# Patient Record
Sex: Female | Born: 1985 | Race: White | Hispanic: No | Marital: Married | Smoking: Never smoker
Health system: Southern US, Community
[De-identification: ages and names within clinical notes are randomized; demographics above are authoritative.]

## PROBLEM LIST (undated history)

## (undated) DIAGNOSIS — G5600 Carpal tunnel syndrome, unspecified upper limb: Secondary | ICD-10-CM

## (undated) DIAGNOSIS — E669 Obesity, unspecified: Secondary | ICD-10-CM

## (undated) DIAGNOSIS — R42 Dizziness and giddiness: Secondary | ICD-10-CM

## (undated) DIAGNOSIS — J45909 Unspecified asthma, uncomplicated: Secondary | ICD-10-CM

## (undated) DIAGNOSIS — D509 Iron deficiency anemia, unspecified: Secondary | ICD-10-CM

## (undated) DIAGNOSIS — S92919A Unspecified fracture of unspecified toe(s), initial encounter for closed fracture: Secondary | ICD-10-CM

## (undated) DIAGNOSIS — R55 Syncope and collapse: Secondary | ICD-10-CM

## (undated) DIAGNOSIS — E559 Vitamin D deficiency, unspecified: Secondary | ICD-10-CM

## (undated) DIAGNOSIS — G43909 Migraine, unspecified, not intractable, without status migrainosus: Secondary | ICD-10-CM

## (undated) DIAGNOSIS — F429 Obsessive-compulsive disorder, unspecified: Secondary | ICD-10-CM

## (undated) HISTORY — DX: Migraine, unspecified, not intractable, without status migrainosus: G43.909

## (undated) HISTORY — DX: Obsessive-compulsive disorder, unspecified: F42.9

## (undated) HISTORY — PX: TONSILLECTOMY: SUR1361

## (undated) HISTORY — DX: Unspecified fracture of unspecified toe(s), initial encounter for closed fracture: S92.919A

## (undated) HISTORY — DX: Iron deficiency anemia, unspecified: D50.9

## (undated) HISTORY — PX: WISDOM TOOTH EXTRACTION: SHX21

## (undated) HISTORY — DX: Carpal tunnel syndrome, unspecified upper limb: G56.00

## (undated) HISTORY — DX: Syncope and collapse: R55

## (undated) HISTORY — DX: Vitamin D deficiency, unspecified: E55.9

## (undated) HISTORY — DX: Dizziness and giddiness: R42

## (undated) HISTORY — DX: Obesity, unspecified: E66.9

---
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
## 2010-07-06 ENCOUNTER — Ambulatory Visit (HOSPITAL_COMMUNITY)
Admission: RE | Admit: 2010-07-06 | Discharge: 2010-07-06 | Payer: Self-pay | Source: Home / Self Care | Attending: Family Medicine | Admitting: Family Medicine

## 2010-12-02 ENCOUNTER — Emergency Department (HOSPITAL_COMMUNITY): Payer: BC Managed Care – PPO

## 2010-12-02 ENCOUNTER — Emergency Department (HOSPITAL_COMMUNITY)
Admission: EM | Admit: 2010-12-02 | Discharge: 2010-12-02 | Disposition: A | Discharge status: Home / Self Care | Payer: BC Managed Care – PPO | Attending: Emergency Medicine | Admitting: Emergency Medicine

## 2010-12-02 DIAGNOSIS — S92919A Unspecified fracture of unspecified toe(s), initial encounter for closed fracture: Secondary | ICD-10-CM | POA: Insufficient documentation

## 2010-12-02 DIAGNOSIS — IMO0002 Reserved for concepts with insufficient information to code with codable children: Secondary | ICD-10-CM | POA: Insufficient documentation

## 2010-12-02 DIAGNOSIS — Y92009 Unspecified place in unspecified non-institutional (private) residence as the place of occurrence of the external cause: Secondary | ICD-10-CM | POA: Insufficient documentation

## 2011-08-30 ENCOUNTER — Other Ambulatory Visit (HOSPITAL_COMMUNITY): Payer: Self-pay | Admitting: Family Medicine

## 2011-08-30 ENCOUNTER — Ambulatory Visit (HOSPITAL_COMMUNITY)
Admission: RE | Admit: 2011-08-30 | Discharge: 2011-08-30 | Disposition: A | Discharge status: Home / Self Care | Payer: BC Managed Care – PPO | Source: Ambulatory Visit | Attending: Family Medicine | Admitting: Family Medicine

## 2011-08-30 DIAGNOSIS — G8929 Other chronic pain: Secondary | ICD-10-CM

## 2011-08-30 DIAGNOSIS — M25549 Pain in joints of unspecified hand: Secondary | ICD-10-CM | POA: Insufficient documentation

## 2011-08-30 DIAGNOSIS — M25539 Pain in unspecified wrist: Secondary | ICD-10-CM | POA: Insufficient documentation

## 2012-04-30 ENCOUNTER — Encounter (HOSPITAL_COMMUNITY): Payer: Self-pay | Admitting: *Deleted

## 2012-04-30 ENCOUNTER — Emergency Department (HOSPITAL_COMMUNITY)
Admission: EM | Admit: 2012-04-30 | Discharge: 2012-04-30 | Disposition: A | Discharge status: Home / Self Care | Payer: BC Managed Care – PPO | Attending: Emergency Medicine | Admitting: Emergency Medicine

## 2012-04-30 DIAGNOSIS — R531 Weakness: Secondary | ICD-10-CM

## 2012-04-30 DIAGNOSIS — R5383 Other fatigue: Secondary | ICD-10-CM | POA: Insufficient documentation

## 2012-04-30 DIAGNOSIS — R5381 Other malaise: Secondary | ICD-10-CM | POA: Insufficient documentation

## 2012-04-30 DIAGNOSIS — R42 Dizziness and giddiness: Secondary | ICD-10-CM | POA: Insufficient documentation

## 2012-04-30 DIAGNOSIS — R0602 Shortness of breath: Secondary | ICD-10-CM | POA: Insufficient documentation

## 2012-04-30 HISTORY — DX: Unspecified asthma, uncomplicated: J45.909

## 2012-04-30 LAB — BASIC METABOLIC PANEL
BUN: 13 mg/dL (ref 6–23)
CO2: 24 mEq/L (ref 19–32)
Calcium: 9.6 mg/dL (ref 8.4–10.5)
Chloride: 101 mEq/L (ref 96–112)
Creatinine, Ser: 0.62 mg/dL (ref 0.50–1.10)
GFR calc Af Amer: >90 mL/min (ref >90)
GFR calc non Af Amer: >90 mL/min (ref >90)
Glucose, Bld: 89 mg/dL (ref 70–99)
Potassium: 4 mEq/L (ref 3.5–5.1)
Sodium: 136 mEq/L (ref 135–145)

## 2012-04-30 LAB — CBC
HCT: 40.3 % (ref 36.0–46.0)
Hemoglobin: 13.4 g/dL (ref 12.0–15.0)
MCH: 28.5 pg (ref 26.0–34.0)
MCHC: 33.3 g/dL (ref 30.0–36.0)
MCV: 85.7 fL (ref 78.0–100.0)
Platelets: 438 10*3/uL — ABNORMAL HIGH (ref 150–400)
RBC: 4.7 MIL/uL (ref 3.87–5.11)
RDW: 13.9 % (ref 11.5–15.5)
WBC: 11.9 10*3/uL — ABNORMAL HIGH (ref 4.0–10.5)

## 2012-04-30 LAB — MAGNESIUM: Magnesium: 2 mg/dL (ref 1.5–2.5)

## 2012-04-30 NOTE — ED Provider Notes (Signed)
History  This chart was scribed for Raeford Razor, MD by Bennett Scrape. This patient was seen in room APA02/APA02 and the patient's care was started at 4:51PM.  CSN: [id number]  Arrival date & time 04/30/12  1534   First MD Initiated Contact with Patient 04/30/12 1651      Chief Complaint  Patient presents with  . Shortness of Breath    The history is provided by the patient. No language interpreter was used.    Shannon Brooks is a 26 y.o. female who presents to the Emergency Department complaining of 4 episodes of intermittent chest tightness, SOB, HA and legs weakness for the past 4 days. She reports that during these 5 minute epsidoes she starts to stumble, losses coordination, has generalized weakness, feels lightheaded and her HA worsens which leads to her legs ";giving out from under me". She reports 4 falls since onset, 2 which happened at work today. She has a h/o asthma and reports that she has tried her inhaler for the chest tightness with no improvement. She denies LOC stating that she is completely alert and awake during the episodes and reports that she has been able to ambulate after the episodes. She has seen her PCP for the symptoms recently and states that she has an [place] for her gallbladder on 05/08/12 even though she denies c/o GI symptoms. She denies having prior episodes of similar symptoms. She also reports a mild dry cough but denies fever, abdominal pain, abnormal vaginal bleeding or hematochezia as associated symptoms. She denies possibility of pregnancy. She also has a h/o depression and denies smoking and alcohol use.  PCP is [facility].  Past Medical History  Diagnosis Date  . Asthma   . Depression     Past Surgical History  Procedure Date  . Tonsillectomy     History reviewed. No pertinent family history.  History  Substance Use Topics  . Smoking status: Never Smoker   . Smokeless tobacco: Not on file  . Alcohol Use: No    No OB history  provided.  Review of Systems  Constitutional: Negative for fever and chills.  Respiratory: Positive for cough, chest tightness and shortness of breath.   Cardiovascular: Negative for chest pain and leg swelling.  Gastrointestinal: Negative for nausea, vomiting, abdominal pain, diarrhea and blood in stool.  Genitourinary: Negative for vaginal bleeding.  Neurological: Positive for weakness and headaches. Negative for syncope and numbness.  All other systems reviewed and are negative.    Allergies  Penicillins  Home Medications  No current outpatient prescriptions on file.  Triage Vitals: BP 120/70  Pulse 64  Temp 99.1 F (37.3 C) (Oral)  Resp 20  Ht 5\' 5"  (1.651 m)  Wt 250 lb (113.399 kg)  BMI 41.60 kg/m2  SpO2 100%  LMP 04/30/2012  Physical Exam  Nursing note and vitals reviewed. Constitutional: She is oriented to person, place, and time. She appears well-developed and well-nourished. No distress.       Pt is resting comfortably   HENT:  Head: Normocephalic and atraumatic.  Eyes: Conjunctivae normal and EOM are normal. Pupils are equal, round, and reactive to light.  Neck: Normal range of motion. Neck supple.  Cardiovascular: Normal rate, regular rhythm and normal heart sounds.   Pulmonary/Chest: Effort normal and breath sounds normal. No respiratory distress. She has no wheezes.  Abdominal: Soft. Bowel sounds are normal.  Musculoskeletal: Normal range of motion.       Normal strength in all extremities  Neurological: She is alert and oriented to person, place, and time. No cranial nerve deficit.       Normal finger to nose, normal heel to shin test  Skin: Skin is warm and dry.  Psychiatric: She has a normal mood and affect.    ED Course  Procedures (including critical care time)  DIAGNOSTIC STUDIES: Oxygen Saturation is 100% on room air, normal by my interpretation.    COORDINATION OF CARE: 5:00PM-Informed pt that EKG appears normal. Discussed treatment plan  which includes a metabolic panel, CBC and magnesium with pt at bedside and pt agreed to plan.  6:46PM-Pt rechecked and is resting comfortably. Discussed discharge plan of following up with PCP to get a Holter monitor with pt at bedside and pt agreed to plan.   Labs Reviewed  CBC - Abnormal; Notable for the following:    WBC 11.9 (*)     Platelets 438 (*)     All other components within normal limits  BASIC METABOLIC PANEL  MAGNESIUM   No results found.  EKG:  Rhythm: normal sinus Vent. rate 64 BPM PR interval 146 ms QRS duration 96 ms QT/QTc 412/425 ms Axis: normal ST segments: NS ST changes anteriorly   1. Generalized weakness       MDM  26yF with near syncope. W/u fairly unremarkable. HD stable. EKG with normal intervals. Feel safe for DC at this time but discussed possibly holter monitoring.  I personally preformed the services scribed in my presence. The recorded information has been reviewed and considered. Raeford Razor, MD.        Raeford Razor, MD 05/10/12 [id number]

## 2012-04-30 NOTE — ED Notes (Signed)
Left in c/o mother for transport home; instructions reviewed and f/u information provided.  Verbalizes understanding.

## 2012-04-30 NOTE — ED Notes (Addendum)
Has fallen mult times since Friday, "my legs give way"  Seen by MD and blood work was normal.  Given ventolin inhaler. For sob.  Headache

## 2012-05-24 ENCOUNTER — Ambulatory Visit (HOSPITAL_COMMUNITY)
Admission: RE | Admit: 2012-05-24 | Discharge: 2012-05-24 | Disposition: A | Discharge status: Home / Self Care | Payer: BC Managed Care – PPO | Source: Ambulatory Visit | Attending: Internal Medicine | Admitting: Internal Medicine

## 2012-05-24 ENCOUNTER — Other Ambulatory Visit (HOSPITAL_COMMUNITY): Payer: Self-pay | Admitting: Internal Medicine

## 2012-05-24 DIAGNOSIS — R42 Dizziness and giddiness: Secondary | ICD-10-CM | POA: Insufficient documentation

## 2012-05-24 DIAGNOSIS — R55 Syncope and collapse: Secondary | ICD-10-CM

## 2012-06-05 ENCOUNTER — Ambulatory Visit (INDEPENDENT_AMBULATORY_CARE_PROVIDER_SITE_OTHER): Payer: BC Managed Care – PPO | Admitting: Cardiology

## 2012-06-05 ENCOUNTER — Encounter: Payer: Self-pay | Admitting: Cardiology

## 2012-06-05 VITALS — BP 115/78 | HR 86 | Ht 65.0 in | Wt 257.0 lb

## 2012-06-05 DIAGNOSIS — R55 Syncope and collapse: Secondary | ICD-10-CM

## 2012-06-05 NOTE — Progress Notes (Signed)
Clinical Summary Shannon Brooks is a 26 y.o.female referred for cardiology consultation by Dr. Margo Aye. She reports a one month history of daily episodes of near syncope, typically occurs when she is walking or standing up. She describes a sudden feeling of being lightheaded and as if she might faint, not preceded by any specific palpitations however. She then however will often sink to the ground, stating that her left leg gives way, although she reports quite clearly that she never loses consciousness. She feels weak thereafter but is able to continue on her activities. Also reports having headaches over the last month.  She states that she only fainted one time as a child due to an asthma attack. She states she has been on Paxil for at least a year.  ECG from October reviewed showing normal sinus rhythm with normal intervals. Recent head CT 11/1 was normal, ordered by Dr. Margo Aye.  Orthostatics checked in clinic today were normal.   Allergies  Allergen Reactions  . Penicillins   . Desipramine Rash    Current Outpatient Prescriptions  Medication Sig Dispense Refill  . albuterol (PROVENTIL HFA;VENTOLIN HFA) 108 (90 BASE) MCG/ACT inhaler Inhale 2 puffs into the lungs every 6 (six) hours as needed.      Marland Kitchen PARoxetine (PAXIL) 20 MG tablet Take 20 mg by mouth daily.        Past Medical History  Diagnosis Date  . Asthma   . Depression   . Broken toe   . Carpal tunnel syndrome     Past Surgical History  Procedure Date  . Tonsillectomy     Family History  Problem Relation Age of Onset  . Cancer Mother   . Muscular dystrophy Father     Social History Ms. Shannon Brooks reports that she has never smoked. She has never used smokeless tobacco. Shannon Brooks reports that she does not drink alcohol.  Review of Systems No reproducible exertional chest pain. She does feel some tightness when she has asthma flares, mentions a time when she had the symptoms working outdoors with her father in a smoky  environment. Otherwise negative.  Physical Examination Filed Vitals:   06/05/12 1428  BP: 115/78  Pulse: 86   Filed Weights   06/05/12 1347  Weight: 257 lb (116.574 kg)   Patient in no acute distress. HEENT: Conjunctiva and lids normal, oropharynx clear with moist mucosa. Neck: Supple, no elevated JVP or carotid bruits, no thyromegaly. Lungs: Clear to auscultation, nonlabored breathing at rest. Cardiac: Regular rate and rhythm, no S3 or significant systolic murmur, no pericardial rub. Abdomen: Soft, nontender, bowel sounds present. Extremities: No pitting edema, increased adipose tissue, distal pulses 2+. Skin: Warm and dry. Musculoskeletal: No kyphosis. Neuropsychiatric: Alert and oriented x3, affect grossly appropriate.   Problem List and Plan   Near syncope Etiology not certain. Seems to be orthostatic in description, although formal assessment of orthostatic blood pressure and heart rate today was normal. Paxil can sometimes cause headaches and dizziness, however she has been on this medication for a year and the symptoms are only one-month-old. She reports no propensity to fainting, denies any specific palpitations or known precipitant otherwise. Vasovagal syncope would be a consideration, although it is unclear why this would all of a sudden become an issue, and be so frequent. She also states having a normal echocardiogram done per Dr. Otilio Saber office, report to be requested. Will proceed with a 48 hour Holter monitor since she is reporting daily symptoms to exclude any obvious dysrhythmia. We  will inform her of the results and forward these results to Dr. Margo Aye for further evaluation.    Jonelle Sidle, M.D., F.A.C.C.

## 2012-06-05 NOTE — Assessment & Plan Note (Addendum)
Etiology not certain. Seems to be orthostatic in description, although formal assessment of orthostatic blood pressure and heart rate today was normal. Paxil can sometimes cause headaches and dizziness, however she has been on this medication for a year and the symptoms are only one-month-old. She reports no propensity to fainting, denies any specific palpitations or known precipitant otherwise. Vasovagal syncope would be a consideration, although it is unclear why this would all of a sudden become an issue, and be so frequent. She also states having a normal echocardiogram done per Dr. Otilio Saber office, report to be requested. Will proceed with a 48 hour Holter monitor since she is reporting daily symptoms to exclude any obvious dysrhythmia. We will inform her of the results and forward these results to Dr. Margo Aye for further evaluation.

## 2012-06-05 NOTE — Patient Instructions (Addendum)
Your physician has recommended that you wear a 48 holter monitor. Holter monitors are medical devices that record the heart's electrical activity. Doctors most often use these monitors to diagnose arrhythmias. Arrhythmias are problems with the speed or rhythm of the heartbeat. The monitor is a small, portable device. You can wear one while you do your normal daily activities. This is usually used to diagnose what is causing palpitations/syncope (passing out). Your physician recommends that you continue on your current medications as directed. Please refer to the Current Medication list given to you today.  We will call you with your results.

## 2012-06-05 NOTE — Addendum Note (Signed)
Addended by: Eustace Moore on: 06/05/2012 03:08 PM   Modules accepted: Orders

## 2012-06-14 ENCOUNTER — Telehealth: Payer: Self-pay | Admitting: *Deleted

## 2012-06-14 DIAGNOSIS — R55 Syncope and collapse: Secondary | ICD-10-CM

## 2012-06-14 NOTE — Telephone Encounter (Signed)
Called to inform patient that results of monitor was unavailable per [employer] and that she would need to come back to office to have new monitor placed. Nurse apologized to patient for this happening. Patient very understanding and will call office back to let nurse know when she is able to come back for 48 hour monitor replacement.

## 2012-08-20 ENCOUNTER — Encounter: Payer: Self-pay | Admitting: *Deleted

## 2012-09-04 ENCOUNTER — Encounter: Payer: Self-pay | Admitting: Neurology

## 2012-10-21 ENCOUNTER — Ambulatory Visit: Payer: Self-pay | Admitting: Neurology

## 2013-10-15 ENCOUNTER — Emergency Department (HOSPITAL_COMMUNITY)
Admission: EM | Admit: 2013-10-15 | Discharge: 2013-10-15 | Disposition: A | Discharge status: Home / Self Care | Payer: BC Managed Care – PPO | Attending: Emergency Medicine | Admitting: Emergency Medicine

## 2013-10-15 ENCOUNTER — Emergency Department (HOSPITAL_COMMUNITY): Payer: BC Managed Care – PPO

## 2013-10-15 ENCOUNTER — Encounter (HOSPITAL_COMMUNITY): Payer: Self-pay | Admitting: Emergency Medicine

## 2013-10-15 DIAGNOSIS — Z79899 Other long term (current) drug therapy: Secondary | ICD-10-CM | POA: Insufficient documentation

## 2013-10-15 DIAGNOSIS — F329 Major depressive disorder, single episode, unspecified: Secondary | ICD-10-CM | POA: Insufficient documentation

## 2013-10-15 DIAGNOSIS — Z8669 Personal history of other diseases of the nervous system and sense organs: Secondary | ICD-10-CM | POA: Insufficient documentation

## 2013-10-15 DIAGNOSIS — J45901 Unspecified asthma with (acute) exacerbation: Secondary | ICD-10-CM | POA: Insufficient documentation

## 2013-10-15 DIAGNOSIS — F3289 Other specified depressive episodes: Secondary | ICD-10-CM | POA: Insufficient documentation

## 2013-10-15 DIAGNOSIS — E669 Obesity, unspecified: Secondary | ICD-10-CM | POA: Insufficient documentation

## 2013-10-15 DIAGNOSIS — Z880 Allergy status to penicillin: Secondary | ICD-10-CM | POA: Insufficient documentation

## 2013-10-15 MED ORDER — ALBUTEROL SULFATE (2.5 MG/3ML) 0.083% IN NEBU
2.5000 mg | INHALATION_SOLUTION | Freq: Once | RESPIRATORY_TRACT | Status: AC
  Administered 2013-10-15: 2.5 mg via RESPIRATORY_TRACT
  Filled 2013-10-15: qty 3

## 2013-10-15 MED ORDER — PREDNISONE 50 MG PO TABS
60.0000 mg | ORAL_TABLET | Freq: Once | ORAL | Status: AC
  Administered 2013-10-15: 60 mg via ORAL
  Filled 2013-10-15 (×2): qty 1

## 2013-10-15 MED ORDER — IPRATROPIUM BROMIDE 0.02 % IN SOLN
0.5000 mg | Freq: Once | RESPIRATORY_TRACT | Status: AC
  Administered 2013-10-15: 0.5 mg via RESPIRATORY_TRACT
  Filled 2013-10-15: qty 2.5

## 2013-10-15 MED ORDER — PREDNISONE 10 MG PO TABS: ORAL_TABLET | ORAL | Status: DC

## 2013-10-15 NOTE — ED Notes (Signed)
Pt reports was exposed to an allergen for too long and left inhaler at her house by mistake.  Pt says used her inhaler when it was available but says did help.  Has had 4 puffs of ventolin inhaler but still feels SOB.

## 2013-10-15 NOTE — ED Provider Notes (Signed)
CSN: [id number]     Arrival date & time 10/15/13  1436 History   First MD Initiated Contact with Patient 10/15/13 1731     Chief Complaint  Patient presents with  . Asthma     (Consider location/radiation/quality/duration/timing/severity/associated sxs/prior Treatment) Patient is a 28 y.o. female presenting with asthma. The history is provided by the patient.  Asthma This is a chronic problem. Episode onset: Pt had a flare from her asthma after exposure to an allergen. The problem has been gradually worsening. Associated symptoms include congestion. Pertinent negatives include no abdominal pain, arthralgias, chest pain, coughing, fever, joint swelling, neck pain or rash. Nothing aggravates the symptoms. Treatments tried: allergy medications and albuterol.    Past Medical History  Diagnosis Date  . Asthma   . Depression   . Broken toe   . Carpal tunnel syndrome   . Obesity, unspecified     Obesity  . Near syncope Episodic near-syncope   Past Surgical History  Procedure Laterality Date  . Tonsillectomy     Family History  Problem Relation Age of Onset  . Cancer Mother   . Muscular dystrophy Father    History  Substance Use Topics  . Smoking status: Never Smoker   . Smokeless tobacco: Never Used  . Alcohol Use: Yes     Comment: Drinks alcohol on occasion   OB History   Grav Para Term Preterm Abortions TAB SAB Ect Mult Living                 Review of Systems  Constitutional: Negative for fever and activity change.       All ROS Neg except as noted in HPI  HENT: Positive for congestion. Negative for nosebleeds.   Eyes: Negative for photophobia and discharge.  Respiratory: Positive for wheezing. Negative for cough and shortness of breath.   Cardiovascular: Negative for chest pain and palpitations.  Gastrointestinal: Negative for abdominal pain and blood in stool.  Genitourinary: Negative for dysuria, frequency and hematuria.  Musculoskeletal: Negative for  arthralgias, back pain, joint swelling and neck pain.  Skin: Negative.  Negative for rash.  Neurological: Negative for dizziness, seizures and speech difficulty.  Psychiatric/Behavioral: Negative for hallucinations and confusion.       Depression      Allergies  Penicillins and Desipramine  Home Medications   Current Outpatient Rx  Name  Route  Sig  Dispense  Refill  . albuterol (PROVENTIL HFA;VENTOLIN HFA) 108 (90 BASE) MCG/ACT inhaler   Inhalation   Inhale 2 puffs into the lungs every 6 (six) hours as needed for wheezing or shortness of breath.          . norgestimate-ethinyl estradiol (ORTHO-CYCLEN,SPRINTEC,PREVIFEM) 0.25-35 MG-MCG tablet   Oral   Take 1 tablet by mouth daily.         . predniSONE (DELTASONE) 10 MG tablet      5,4,3,2,1 - take with food   15 tablet   0    BP 129/72  Pulse 72  Temp(Src) 98.3 F (36.8 C) (Oral)  Resp 22  Ht 5\' 5"  (1.651 m)  Wt 250 lb (113.399 kg)  BMI 41.60 kg/m2  SpO2 100%  LMP 10/03/2013 Physical Exam  Nursing note and vitals reviewed. Constitutional: She is oriented to person, place, and time. She appears well-developed and well-nourished.  Non-toxic appearance.  HENT:  Head: Normocephalic.  Right Ear: Tympanic membrane and external ear normal.  Left Ear: Tympanic membrane and external ear normal.  Mild nasal congestion present. Airway  is patent. Uvula is in the midline.  Eyes: EOM and lids are normal. Pupils are equal, round, and reactive to light.  Neck: Normal range of motion. Neck supple. Carotid bruit is not present.  Cardiovascular: Normal rate, regular rhythm, normal heart sounds, intact distal pulses and normal pulses.   Pulmonary/Chest: Breath sounds normal. No respiratory distress.  And coarse breath sounds present. Mild wheezing in the upper chest area. Patient speaks in complete sentences.  Abdominal: Soft. Bowel sounds are normal. There is no tenderness. There is no guarding.  Musculoskeletal: Normal range  of motion.  Lymphadenopathy:       Head (right side): No submandibular adenopathy present.       Head (left side): No submandibular adenopathy present.    She has no cervical adenopathy.  Neurological: She is alert and oriented to person, place, and time. She has normal strength. No cranial nerve deficit or sensory deficit.  Skin: Skin is warm and dry.  Psychiatric: She has a normal mood and affect. Her speech is normal.    ED Course  Procedures (including critical care time) Labs Review Labs Reviewed - No data to display Imaging Review Dg Chest 2 View  10/15/2013   CLINICAL DATA:  Shortness of breath with wheezing today. History of asthma.  EXAM: CHEST  2 VIEW  COMPARISON:  None.  FINDINGS: The heart size and mediastinal contours are normal. The lungs are clear. There is no pleural effusion or pneumothorax. No acute osseous findings are identified.  IMPRESSION: No active cardiopulmonary process.   Electronically Signed   By: Camie Patience M.D.   On: 10/15/2013 15:10     EKG Interpretation None      MDM Patient has a history of allergies and asthma. She was exposed to a flower implant. She has allergies to these, and went into an asthma attack per the patient. She attempted her inhalers but felt that her breathing was getting more difficult.  After treatment with prednisone and albuterol nebulizer treatment the patient states she feels much better. She is ambulatory in the room and [place] without any problem at all. The patient speaks in complete sentences. Prescription for prednisone given to the patient to at to her current medications.    Final diagnoses:  Asthma attack    *I have reviewed nursing notes, vital signs, and all appropriate lab and imaging results for this patient.Lenox Ahr, PA-C 10/15/13 [phone]

## 2013-10-15 NOTE — ED Notes (Signed)
Pt getting breathing treatment

## 2013-10-15 NOTE — Discharge Instructions (Signed)
Please continue your current allergy medications. Please continue your albuterol every 4 hours as needed for wheezing difficulty breathing. Please add on prednisone taper to your current medication regimen. Please see your physician, or return to the emergency department if any changes, problems, or difficulty with her breathing. Asthma, Adult Asthma is a condition of the lungs in which the airways tighten and narrow. Asthma can make it hard to breathe. Asthma cannot be cured, but medicine and lifestyle changes can help control it. Asthma may be started (triggered) by:  Animal skin flakes (dander).  Dust.  Cockroaches.  Pollen.  Mold.  Smoke.  Cleaning products.  Hair sprays or aerosol sprays.  Paint fumes or strong smells.  Cold air, weather changes, and winds.  Crying or laughing hard.  Stress.  Certain medicines or drugs.  Foods, such as dried fruit, potato chips, and sparkling grape juice.  Infections or conditions (colds, flu).  Exercise.  Certain medical conditions or diseases.  Exercise or tiring activities. HOME CARE   Take medicine as told by your doctor.  Use a peak flow meter as told by your doctor. A peak flow meter is a tool that measures how well the lungs are working.  Record and keep track of the peak flow meter's readings.  Understand and use the asthma action plan. An asthma action plan is a written plan for taking care of your asthma and treating your attacks.  To help prevent asthma attacks:  Do not smoke. Stay away from secondhand smoke.  Change your heating and air conditioning filter often.  Limit your use of fireplaces and wood stoves.  Get rid of pests (such as roaches and mice) and their droppings.  Throw away plants if you see mold on them.  Clean your floors. Dust regularly. Use cleaning products that do not smell.  Have someone vacuum when you are not home. Use a vacuum cleaner with a HEPA filter if possible.  Replace  carpet with wood, tile, or vinyl flooring. Carpet can trap animal skin flakes and dust.  Use allergy-proof pillows, mattress covers, and box spring covers.  Wash bed sheets and blankets every week in hot water and dry them in a dryer.  Use blankets that are made of polyester or cotton.  Clean bathrooms and kitchens with bleach. If possible, have someone repaint the walls in these rooms with mold-resistant paint. Keep out of the rooms that are being cleaned and painted.  Wash hands often. GET HELP IF:  You have make a whistling sound when breaking (wheeze), have shortness of breath, or have a cough even if taking medicine to prevent attacks.  The colored mucus you cough up (sputum) is thicker than usual.  The colored mucus you cough up changes from clear or white to yellow, green, gray, or bloody.  You have problems from the medicine you are taking such as:  A rash.  Itching.  Swelling.  Trouble breathing.  You need reliever medicines more than 2 3 times a week.  Your peak flow measurement is still at 50 79% of your personal best after following the action plan for 1 hour. GET HELP RIGHT AWAY IF:   You seem to be worse and are not responding to medicine during an asthma attack.  You are short of breath even at rest.  You get short of breath when doing very little activity.  You have trouble eating, drinking, or talking.  You have chest pain.  You have a fast heartbeat.  Your lips  or fingernails start to turn blue.  You are lightheaded, dizzy, or faint.  Your peak flow is less than 50% of your personal best.  You have a fever or lasting symptoms for more than 2 3 days.  You have a fever and your symptoms suddenly get worse. MAKE SURE YOU:   Understand these instructions.  Will watch your condition.  Will get help right away if you are not doing well or get worse. Document Released: 12/27/2007 Document Revised: 04/30/2013 Document Reviewed:  02/06/2013 [facility] Patient Information 2014 [place], [place].

## 2013-10-15 NOTE — ED Notes (Signed)
Patient given discharge instruction, verbalized understand. Patient ambulatory out of the department.  

## 2013-10-15 NOTE — ED Provider Notes (Signed)
Medical screening examination/treatment/procedure(s) were performed by non-physician practitioner and as supervising physician I was immediately available for consultation/collaboration.   EKG Interpretation None        Charles B. Karle Starch, MD 10/15/13 2322

## 2014-01-09 ENCOUNTER — Encounter (HOSPITAL_COMMUNITY): Payer: Self-pay | Admitting: Emergency Medicine

## 2014-01-09 ENCOUNTER — Emergency Department (HOSPITAL_COMMUNITY)
Admission: EM | Admit: 2014-01-09 | Discharge: 2014-01-09 | Disposition: A | Discharge status: Home / Self Care | Payer: BC Managed Care – PPO | Attending: Emergency Medicine | Admitting: Emergency Medicine

## 2014-01-09 ENCOUNTER — Emergency Department (HOSPITAL_COMMUNITY): Payer: BC Managed Care – PPO

## 2014-01-09 DIAGNOSIS — J45901 Unspecified asthma with (acute) exacerbation: Secondary | ICD-10-CM | POA: Insufficient documentation

## 2014-01-09 DIAGNOSIS — J4530 Mild persistent asthma, uncomplicated: Secondary | ICD-10-CM

## 2014-01-09 DIAGNOSIS — E669 Obesity, unspecified: Secondary | ICD-10-CM | POA: Insufficient documentation

## 2014-01-09 DIAGNOSIS — Z880 Allergy status to penicillin: Secondary | ICD-10-CM | POA: Insufficient documentation

## 2014-01-09 DIAGNOSIS — IMO0002 Reserved for concepts with insufficient information to code with codable children: Secondary | ICD-10-CM | POA: Insufficient documentation

## 2014-01-09 DIAGNOSIS — Z8659 Personal history of other mental and behavioral disorders: Secondary | ICD-10-CM | POA: Insufficient documentation

## 2014-01-09 DIAGNOSIS — Z8781 Personal history of (healed) traumatic fracture: Secondary | ICD-10-CM | POA: Insufficient documentation

## 2014-01-09 DIAGNOSIS — Z8669 Personal history of other diseases of the nervous system and sense organs: Secondary | ICD-10-CM | POA: Insufficient documentation

## 2014-01-09 DIAGNOSIS — Z79899 Other long term (current) drug therapy: Secondary | ICD-10-CM | POA: Insufficient documentation

## 2014-01-09 MED ORDER — ALBUTEROL SULFATE (5 MG/ML) 0.5% IN NEBU: 2.5000 mg | INHALATION_SOLUTION | Freq: Four times a day (QID) | RESPIRATORY_TRACT | Status: DC | PRN

## 2014-01-09 MED ORDER — PREDNISONE 20 MG PO TABS: ORAL_TABLET | ORAL | Status: DC

## 2014-01-09 MED ORDER — PREDNISONE 10 MG PO TABS
60.0000 mg | ORAL_TABLET | Freq: Once | ORAL | Status: AC
  Administered 2014-01-09: 60 mg via ORAL
  Filled 2014-01-09 (×2): qty 1

## 2014-01-09 NOTE — ED Notes (Signed)
Pt states SOB began today ~1100 while working at [employer]. Pt repeatedly used inhaler without relief. Per EMS, Pt was wheezing and was unable to complete sentences. On arrival, Pt has no difficulty talking whatsoever, no audible wheezing noted. Pt received albuterol neb en route which she states relieved her symptoms.

## 2014-01-09 NOTE — Discharge Instructions (Signed)
Prescriptions for prednisone, albuterol inhaler, nebulizer machine.  Rest.   Avoid hot muggy air

## 2014-01-09 NOTE — ED Provider Notes (Signed)
CSN: [id number]     Arrival date & time 01/09/14  1401 History   First MD Initiated Contact with Patient 01/09/14 1405     Chief Complaint  Patient presents with  . Shortness of Breath     (Consider location/radiation/quality/duration/timing/severity/associated sxs/prior Treatment) HPI.... patient has known asthma which is exacerbated by odors and perfumes. She works at [employer] and has had many exposures at work. Today she reports wheezing and shortness of breath approximately 1100. No prodromal illnesses. She used her inhaler with minimal relief. Nonsmoker  Past Medical History  Diagnosis Date  . Asthma   . Depression   . Broken toe   . Carpal tunnel syndrome   . Obesity, unspecified     Obesity  . Near syncope Episodic near-syncope   Past Surgical History  Procedure Laterality Date  . Tonsillectomy     Family History  Problem Relation Age of Onset  . Cancer Mother   . Muscular dystrophy Father    History  Substance Use Topics  . Smoking status: Never Smoker   . Smokeless tobacco: Never Used  . Alcohol Use: Yes     Comment: Drinks alcohol on occasion   OB History   Grav Para Term Preterm Abortions TAB SAB Ect Mult Living                 Review of Systems  All other systems reviewed and are negative.     Allergies  Penicillins and Desipramine  Home Medications   Prior to Admission medications   Medication Sig Start Date End Date Taking? Authorizing Provider  albuterol (PROVENTIL HFA;VENTOLIN HFA) 108 (90 BASE) MCG/ACT inhaler Inhale 2 puffs into the lungs every 6 (six) hours as needed for wheezing or shortness of breath.    Yes Historical Provider, MD  DULERA 200-5 MCG/ACT AERO Inhale 2 puffs into the lungs 2 (two) times daily. 12/19/13  Yes Historical Provider, MD  montelukast (SINGULAIR) 10 MG tablet Take 10 mg by mouth daily. 01/02/14  Yes Historical Provider, MD  norgestimate-ethinyl estradiol (ORTHO-CYCLEN,SPRINTEC,PREVIFEM) 0.25-35 MG-MCG tablet Take 1  tablet by mouth daily.   Yes Historical Provider, MD  albuterol (PROVENTIL) (5 MG/ML) 0.5% nebulizer solution Take 0.5 mLs (2.5 mg total) by nebulization every 6 (six) hours as needed for wheezing or shortness of breath. 01/09/14   Nat Christen, MD  predniSONE (DELTASONE) 20 MG tablet 3 tabs po day one, then 2 po daily x 4 days 01/09/14   Nat Christen, MD   BP 113/93  Pulse 76  Temp(Src) 98 F (36.7 C) (Oral)  Resp 16  Ht 5\' 5"  (1.651 m)  Wt 250 lb (113.399 kg)  BMI 41.60 kg/m2  SpO2 100%  LMP 01/05/2014 Physical Exam  Nursing note and vitals reviewed. Constitutional: She is oriented to person, place, and time. She appears well-developed and well-nourished.  HENT:  Head: Normocephalic and atraumatic.  Eyes: Conjunctivae and EOM are normal. Pupils are equal, round, and reactive to light.  Neck: Normal range of motion. Neck supple.  Cardiovascular: Normal rate, regular rhythm and normal heart sounds.   Pulmonary/Chest: Effort normal.  Minimal bilateral expiratory wheeze  Abdominal: Soft. Bowel sounds are normal.  Musculoskeletal: Normal range of motion.  Neurological: She is alert and oriented to person, place, and time.  Skin: Skin is warm and dry.  Psychiatric: She has a normal mood and affect. Her behavior is normal.    ED Course  Procedures (including critical care time) Labs Review Labs Reviewed - No data to  display  Imaging Review Dg Chest 2 View  01/09/2014   CLINICAL DATA:  Shortness of breath, history asthma  EXAM: CHEST  2 VIEW  COMPARISON:  10/15/2013  FINDINGS: Upper normal heart size.  Normal mediastinal contours and pulmonary vascularity.  Lungs clear.  No pleural effusion or pneumothorax.  Bones unremarkable.  IMPRESSION: No acute abnormalities.   Electronically Signed   By: Lavonia Dana M.D.   On: 01/09/2014 14:30     EKG Interpretation None      MDM   Final diagnoses:  Asthma, mild persistent, uncomplicated    Patient feels much better after  albuterol/Atrovent nebulizer. Chest x-ray negative. We'll start prednisone. Prescription for home nebulizer machine and albuterol solution given.    Nat Christen, MD 01/09/14 [phone]

## 2015-10-04 ENCOUNTER — Ambulatory Visit (HOSPITAL_COMMUNITY): Payer: BLUE CROSS/BLUE SHIELD | Attending: Internal Medicine

## 2015-10-04 DIAGNOSIS — M25511 Pain in right shoulder: Secondary | ICD-10-CM | POA: Insufficient documentation

## 2015-10-04 DIAGNOSIS — M6289 Other specified disorders of muscle: Secondary | ICD-10-CM

## 2015-10-04 DIAGNOSIS — R29898 Other symptoms and signs involving the musculoskeletal system: Secondary | ICD-10-CM | POA: Insufficient documentation

## 2015-10-04 DIAGNOSIS — M25611 Stiffness of right shoulder, not elsewhere classified: Secondary | ICD-10-CM | POA: Diagnosis present

## 2015-10-04 DIAGNOSIS — M629 Disorder of muscle, unspecified: Secondary | ICD-10-CM | POA: Diagnosis present
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
## 2015-10-04 NOTE — Patient Instructions (Signed)
  Doorway Stretch  Place each hand opposite each other on the doorway. (You can change where you feel the stretch by moving arms higher or lower.) Step through with one foot and bend front knee until a stretch is felt and hold. 10 second hold. Repeat 2 times. Step through with the opposite foot on the next rep.     Pectoral stretch with raised arm (at 90 degrees)  Stand at a corner or doorway.  Place the front of your shoulder and entire arm onto the wall.  Slowly turn your body away from the wall until you feel a gentle stretch in the front of your shoulder and chest. Hold 10 seconds. Repeat 2 times.    Internal Rotation Across Back  Grab the end of a towel with your affected side, palm facing backwards. Grab the towel with your unaffected side and pull your affected hand across your back until you feel a stretch in the front of your shoulder. If you feel pain, pull just to the pain, do not pull through the pain. Hold. Return your affected arm to your side. Try to keep your hand/arm close to your body during the entire movement.   Hold 10 seconds. Repeat 2 times.    Scapular Retraction (Standing)   With arms at sides, pinch shoulder blades together. Repeat _10___ times per set. Do __1__ sets per session. Do ____ sessions per day.    Posterior Capsule Stretch   Stand or sit, one arm across body so hand rests over opposite shoulder. Gently push on crossed elbow with other hand until stretch is felt in shoulder of crossed arm. Hold _10__ seconds.  Repeat __2_ times per session. Do ___ sessions per day.  Wall Wash - Flexion  Using a towel, slide your arm up the wall until a stretch is felt in your shoulder   Hold 2 seconds at top. Repeat 10 times.

## 2015-10-11 ENCOUNTER — Encounter (HOSPITAL_COMMUNITY): Payer: Self-pay

## 2015-10-11 ENCOUNTER — Ambulatory Visit (HOSPITAL_COMMUNITY): Payer: BLUE CROSS/BLUE SHIELD

## 2015-10-11 DIAGNOSIS — M6289 Other specified disorders of muscle: Secondary | ICD-10-CM

## 2015-10-11 DIAGNOSIS — M629 Disorder of muscle, unspecified: Secondary | ICD-10-CM

## 2015-10-11 DIAGNOSIS — M25611 Stiffness of right shoulder, not elsewhere classified: Secondary | ICD-10-CM

## 2015-10-11 DIAGNOSIS — M25511 Pain in right shoulder: Secondary | ICD-10-CM | POA: Diagnosis not present

## 2015-10-11 DIAGNOSIS — R29898 Other symptoms and signs involving the musculoskeletal system: Secondary | ICD-10-CM

## 2015-10-11 NOTE — Therapy (Signed)
[place], [place], [address] Phone: [phone]   Fax:  [phone]  Occupational Therapy Treatment  Patient Details  Name: Shannon Brooks MRN: [id number] Date of Birth: 1986-04-06 Referring Provider: Celene Squibb, MD  Encounter Date: 10/11/2015      OT End of Session - 10/11/15 1441    Visit Number 2   Number of Visits 9   Date for OT Re-Evaluation 11/03/15   Authorization Type BCBS PPO   Authorization Time Period 54 visits for OT/PT/ST   Authorization - Visit Number 2   Authorization - Number of Visits 85   OT Start Time 1435   OT Stop Time 1515   OT Time Calculation (min) 40 min   Activity Tolerance Patient tolerated treatment well   Behavior During Therapy [facility] for tasks assessed/performed      Past Medical History  Diagnosis Date  . Asthma   . Depression   . Broken toe   . Carpal tunnel syndrome   . Obesity, unspecified     Obesity  . Near syncope Episodic near-syncope    Past Surgical History  Procedure Laterality Date  . Tonsillectomy      There were no vitals filed for this visit.  Visit Diagnosis:  Pain in joint of right shoulder  Tight fascia  Shoulder weakness  Shoulder stiffness, right      Subjective Assessment - 10/11/15 1553    Subjective  S: I didn't do my exercises at home like I should because my husband is home from work.    Currently in Pain? Yes   Pain Score 2    Pain Location Shoulder   Pain Orientation Right   Pain Descriptors / Indicators Burning;Throbbing   Pain Type Chronic pain   Pain Radiating Towards Shoots up the right side of the neck.   Pain Onset More than a month ago   Pain Frequency Constant   Aggravating Factors  Movement and use.   Pain Relieving Factors Heat, pain medications, not driving   Effect of Pain on Daily Activities [place] to complete normal daily tasks when pain is bad.   Multiple Pain Sites No            [facility] OT  Assessment - 10/11/15 1601    Assessment   Diagnosis Right shoulder pain   Precautions   Precautions None                  OT Treatments/Exercises (OP) - 10/11/15 1443    Exercises   Exercises Shoulder   Shoulder Exercises: Supine   Protraction PROM;5 reps;AROM;10 reps   Horizontal ABduction PROM;5 reps;AROM;10 reps   External Rotation PROM;5 reps;AROM;10 reps   Internal Rotation PROM;5 reps;AROM;10 reps   Flexion PROM;5 reps;AROM;10 reps   ABduction PROM;5 reps;AROM;10 reps   Shoulder Exercises: Standing   Protraction AROM;10 reps   Horizontal ABduction AROM;10 reps   External Rotation AROM;10 reps   Internal Rotation AROM;10 reps   Flexion AROM;10 reps   ABduction AROM;10 reps   Extension Theraband;10 reps   Theraband Level (Shoulder Extension) Level 2 (Red)   Row Theraband;10 reps   Theraband Level (Shoulder Row) Level 2 (Red)   Retraction Theraband;10 reps   Theraband Level (Shoulder Retraction) Level 2 (Red)   Shoulder Exercises: ROM/Strengthening   Wall Wash 2'   Other ROM/Strengthening Exercises Proximal shoulder strengthening, standing  10X   Manual Therapy   Manual Therapy Myofascial release   Manual therapy  comments manual therapy completed prior to exercises   Myofascial Release Myofascial release and manual stretching completed to left upper arm, trapezius, and scapularis region as well as left teres major to decrease fascial restrictions and increase joint mobility in a pain free zone.                OT Education - 10/11/15 1556    Education provided Yes   Education Details A/ROM exercises, given printout of eval, therapist reviewed goals with patient   Person(s) Educated Patient   Methods Handout;Explanation   Comprehension Verbalized understanding          OT Short Term Goals - 10/11/15 1600    OT SHORT TERM GOAL #1   Title Patient will be educated and independent with HEP to increase functional use of RUE during daily tasks.     Time 4   Period Weeks   Status On-going   OT SHORT TERM GOAL #2   Title Patient will return to highest level of independence with all daily tasks using RUE.    Time 4   Period Weeks   Status On-going   OT SHORT TERM GOAL #3   Title Patient will decrease pain level to 3/10 or less in RUE to allow for more use during daily tasks.    Time 4   Period Weeks   Status On-going   OT SHORT TERM GOAL #4   Title Patient will decrease fascial restrictions to min amount to increase functional mobility.    Time 4   Period Weeks   Status On-going   OT SHORT TERM GOAL #5   Title Patient will increase RUE strength to 4+/5 to increase ability to return to normal daily tasks that require lifting light to moderate amount weight.    Time 4   Period Weeks   Status On-going                  Plan - 10/11/15 1558    Clinical Impression Statement A: Initiated myofascial release, manual stretching, A/ROM exercises, and scapular strengthening with band. Verbal, tactile, and visual cues for form and technique. Updated HEP to include A/ROM exercises.    Plan P: Followup on updated HEP, add W arms and X to V arms.        Problem List Patient Active Problem List   Diagnosis Date Noted  . Near syncope 06/05/2012    Ailene Ravel, OTR/L,CBIS  [phone]  10/11/2015, 4:01 PM  [facility] [address] [place], [place], [address] Phone: [phone]   Fax:  [phone]  Name: Shannon Brooks MRN: [id number] Date of Birth: March 05, 1986

## 2015-10-11 NOTE — Patient Instructions (Signed)
Complete all exercises 10 times. 2-3X a day.  1) Shoulder Protraction    Begin with elbows by your side, slowly "punch" straight out in front of you keeping arms/elbows straight.      2) Shoulder Flexion  Supine:     Standing:         Begin with arms at your side with thumbs pointed up, slowly raise both arms up and forward towards overhead.      3) Horizontal abduction/adduction  Supine:   Standing:           Begin with arms straight out in front of you, bring out to the side in at "T" shape. Keep arms straight entire time.    4) Internal & External Rotation    *No band* -Stand with elbows at the side and elbows bent 90 degrees. Move your forearms away from your body, then bring back inward toward the body.     5) Shoulder Abduction  Supine:     Standing:       Lying on your back begin with your arms flat on the table next to your side. Slowly move your arms out to the side so that they go overhead, in a jumping jack or snow angel movement.

## 2015-10-13 ENCOUNTER — Encounter (HOSPITAL_COMMUNITY): Payer: Self-pay | Admitting: Occupational Therapy

## 2015-10-13 ENCOUNTER — Ambulatory Visit (HOSPITAL_COMMUNITY): Payer: BLUE CROSS/BLUE SHIELD | Admitting: Occupational Therapy

## 2015-10-13 DIAGNOSIS — R29898 Other symptoms and signs involving the musculoskeletal system: Secondary | ICD-10-CM

## 2015-10-13 DIAGNOSIS — M25511 Pain in right shoulder: Secondary | ICD-10-CM | POA: Diagnosis not present

## 2015-10-13 DIAGNOSIS — M6289 Other specified disorders of muscle: Secondary | ICD-10-CM

## 2015-10-13 DIAGNOSIS — M25611 Stiffness of right shoulder, not elsewhere classified: Secondary | ICD-10-CM

## 2015-10-13 DIAGNOSIS — M629 Disorder of muscle, unspecified: Secondary | ICD-10-CM

## 2015-10-13 NOTE — Therapy (Signed)
[place], [place], [address] Phone: [phone]   Fax:  [phone]  Occupational Therapy Treatment  Patient Details  Name: Shannon Brooks MRN: [id number] Date of Birth: 02-14-1986 Referring Provider: Celene Squibb, MD  Encounter Date: 10/13/2015      OT End of Session - 10/13/15 1514    Visit Number 3   Number of Visits 9   Date for OT Re-Evaluation 11/03/15   Authorization Type BCBS PPO   Authorization Time Period 53 visits for OT/PT/ST   Authorization - Visit Number 3   Authorization - Number of Visits 40   OT Start Time 1434   OT Stop Time 1516   OT Time Calculation (min) 42 min   Activity Tolerance Patient tolerated treatment well   Behavior During Therapy [facility] for tasks assessed/performed      Past Medical History  Diagnosis Date  . Asthma   . Depression   . Broken toe   . Carpal tunnel syndrome   . Obesity, unspecified     Obesity  . Near syncope Episodic near-syncope    Past Surgical History  Procedure Laterality Date  . Tonsillectomy      There were no vitals filed for this visit.  Visit Diagnosis:  Pain in joint of right shoulder  Tight fascia  Shoulder weakness  Shoulder stiffness, right      Subjective Assessment - 10/13/15 1436    Subjective  S: I was doing a lot of lifting this morning.    Currently in Pain? Yes   Pain Score 3    Pain Location Shoulder   Pain Orientation Right   Pain Descriptors / Indicators Sore   Pain Type Chronic pain   Pain Radiating Towards radiates up the right side of the neck.    Pain Onset More than a month ago   Pain Frequency Constant   Aggravating Factors  movement and lifting   Pain Relieving Factors heat, medications, stretches   Effect of Pain on Daily Activities limited ability to use during daily tasks.             [facility] OT Assessment - 10/13/15 1436    Assessment   Diagnosis Right shoulder pain   Precautions   Precautions  None                  OT Treatments/Exercises (OP) - 10/13/15 1449    Exercises   Exercises Shoulder   Shoulder Exercises: Supine   Protraction PROM;5 reps;AROM;10 reps   Horizontal ABduction PROM;5 reps;AROM;10 reps   External Rotation PROM;5 reps;AROM;10 reps   Internal Rotation PROM;5 reps;AROM;10 reps   Flexion PROM;5 reps;AROM;10 reps   ABduction PROM;5 reps;AROM;10 reps   Shoulder Exercises: Standing   Protraction AROM;10 reps   Horizontal ABduction AROM;10 reps   External Rotation AROM;10 reps   Internal Rotation AROM;10 reps   Flexion AROM;10 reps   ABduction AROM;10 reps   Extension Theraband;10 reps   Theraband Level (Shoulder Extension) Level 2 (Red)   Row Theraband;10 reps   Theraband Level (Shoulder Row) Level 2 (Red)   Retraction Theraband;10 reps   Theraband Level (Shoulder Retraction) Level 2 (Red)   Shoulder Exercises: ROM/Strengthening   UBE (Upper Arm Bike) Level 1 2' forward 2' reverse  verbal cuing for speed and direction   "W" Arms 10X   X to V Arms 10X   Proximal Shoulder Strengthening, Supine 10X each no rest breaks   Other ROM/Strengthening Exercises  Proximal shoulder strengthening, standing, 10X   Manual Therapy   Manual Therapy Myofascial release   Manual therapy comments manual therapy completed prior to exercises   Myofascial Release Myofascial release and manual stretching completed to left upper arm, trapezius, and scapularis region as well as left teres major to decrease fascial restrictions and increase joint mobility in a pain free zone.                  OT Short Term Goals - 10/11/15 1600    OT SHORT TERM GOAL #1   Title Patient will be educated and independent with HEP to increase functional use of RUE during daily tasks.    Time 4   Period Weeks   Status On-going   OT SHORT TERM GOAL #2   Title Patient will return to highest level of independence with all daily tasks using RUE.    Time 4   Period Weeks    Status On-going   OT SHORT TERM GOAL #3   Title Patient will decrease pain level to 3/10 or less in RUE to allow for more use during daily tasks.    Time 4   Period Weeks   Status On-going   OT SHORT TERM GOAL #4   Title Patient will decrease fascial restrictions to min amount to increase functional mobility.    Time 4   Period Weeks   Status On-going   OT SHORT TERM GOAL #5   Title Patient will increase RUE strength to 4+/5 to increase ability to return to normal daily tasks that require lifting light to moderate amount weight.    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 Added x to v arms, w arms, UBE this session. Pt requires verbal cuing for form and speed during all exercises. Pt reports HEP is going well.    Plan P: Provide scapular theraband HEP if pt with good form. Increase A/ROM repetitions to 15 in supine, working on maintaining steady speed.          Problem List Patient Active Problem List   Diagnosis Date Noted  . Near syncope 06/05/2012    Guadelupe Sabin, OTR/L  [phone]  10/13/2015, 3:25 PM  [place], [place], [address] Phone: [phone]   Fax:  [phone]  Name: Shannon Brooks MRN: [id number] Date of Birth: [birth year]

## 2015-10-19 ENCOUNTER — Encounter (HOSPITAL_COMMUNITY): Payer: Self-pay

## 2015-10-19 ENCOUNTER — Ambulatory Visit (HOSPITAL_COMMUNITY): Payer: BLUE CROSS/BLUE SHIELD

## 2015-10-19 DIAGNOSIS — M6289 Other specified disorders of muscle: Secondary | ICD-10-CM

## 2015-10-19 DIAGNOSIS — M629 Disorder of muscle, unspecified: Secondary | ICD-10-CM

## 2015-10-19 DIAGNOSIS — M25611 Stiffness of right shoulder, not elsewhere classified: Secondary | ICD-10-CM

## 2015-10-19 DIAGNOSIS — R29898 Other symptoms and signs involving the musculoskeletal system: Secondary | ICD-10-CM

## 2015-10-19 DIAGNOSIS — M25511 Pain in right shoulder: Secondary | ICD-10-CM

## 2015-10-19 NOTE — Therapy (Signed)
[place], [place], [address] Phone: [phone]   Fax:  [phone]  Occupational Therapy Treatment  Patient Details  Name: Shannon Brooks MRN: [id number] Date of Birth: 09-22-1985 Referring Provider: Celene Squibb, MD  Encounter Date: 10/19/2015      OT End of Session - 10/19/15 1054    Visit Number 4   Number of Visits 9   Date for OT Re-Evaluation 11/03/15   Authorization Type BCBS PPO   Authorization Time Period 60 visits for OT/PT/ST   Authorization - Visit Number 3   Authorization - Number of Visits 45   OT Start Time 0930   OT Stop Time 1015   OT Time Calculation (min) 45 min   Activity Tolerance Patient tolerated treatment well   Behavior During Therapy [facility] for tasks assessed/performed      Past Medical History  Diagnosis Date  . Asthma   . Depression   . Broken toe   . Carpal tunnel syndrome   . Obesity, unspecified     Obesity  . Near syncope Episodic near-syncope    Past Surgical History  Procedure Laterality Date  . Tonsillectomy      There were no vitals filed for this visit.  Visit Diagnosis:  Pain in joint of right shoulder  Tight fascia  Shoulder weakness  Shoulder stiffness, right      Subjective Assessment - 10/19/15 0934    Subjective  S: I've gone an entire day without taking any medicine.   Currently in Pain? No/denies            [facility] OT Assessment - 10/19/15 [id number]    Assessment   Diagnosis Right shoulder pain   Precautions   Precautions None                  OT Treatments/Exercises (OP) - 10/19/15 0937    Exercises   Exercises Shoulder   Shoulder Exercises: Supine   Protraction PROM;5 reps;AROM;15 reps   Horizontal ABduction PROM;5 reps;AROM;15 reps   External Rotation PROM;5 reps;AROM;15 reps   Internal Rotation PROM;5 reps;AROM;15 reps   Flexion PROM;5 reps;AROM;15 reps   ABduction PROM;5 reps;AROM;15 reps   Shoulder Exercises:  Standing   Protraction AROM;15 reps   Horizontal ABduction AROM;15 reps   External Rotation AROM;15 reps   Internal Rotation AROM;15 reps   Flexion AROM;15 reps   ABduction AROM;15 reps   Extension Theraband;10 reps   Theraband Level (Shoulder Extension) Level 2 (Red)   Row Theraband;10 reps   Theraband Level (Shoulder Row) Level 2 (Red)   Retraction Theraband;10 reps   Theraband Level (Shoulder Retraction) Level 2 (Red)   Shoulder Exercises: ROM/Strengthening   UBE (Upper Arm Bike) Level 2 3' forward 3' reverse   Cybex Press 1 plate;15 reps   Cybex Row 2 plate;15 reps   "W" Arms 10X   X to V Arms 10X   Proximal Shoulder Strengthening, Supine 15X each no rest breaks   Other ROM/Strengthening Exercises Proximal shoulder strengthening, standing, 15X   Manual Therapy   Manual Therapy Myofascial release   Manual therapy comments manual therapy completed prior to exercises   Myofascial Release Myofascial release and manual stretching completed to left upper arm, trapezius, and scapularis region as well as left teres major to decrease fascial restrictions and increase joint mobility in a pain free zone.                OT Education - 10/19/15 [id number]  Education provided Yes   Education Details Theraband HEP provided.   Person(s) Educated Patient   Methods Explanation;Demonstration;Verbal cues   Comprehension Verbalized understanding;Returned demonstration;Verbal cues required          OT Short Term Goals - 10/11/15 1600    OT SHORT TERM GOAL #1   Title Patient will be educated and independent with HEP to increase functional use of RUE during daily tasks.    Time 4   Period Weeks   Status On-going   OT SHORT TERM GOAL #2   Title Patient will return to highest level of independence with all daily tasks using RUE.    Time 4   Period Weeks   Status On-going   OT SHORT TERM GOAL #3   Title Patient will decrease pain level to 3/10 or less in RUE to allow for more use  during daily tasks.    Time 4   Period Weeks   Status On-going   OT SHORT TERM GOAL #4   Title Patient will decrease fascial restrictions to min amount to increase functional mobility.    Time 4   Period Weeks   Status On-going   OT SHORT TERM GOAL #5   Title Patient will increase RUE strength to 4+/5 to increase ability to return to normal daily tasks that require lifting light to moderate amount weight.    Time 4   Period Weeks   Status On-going                  Plan - 10/19/15 1055    Clinical Impression Statement A: added scapular theraband strengthening exercises to HEP. Increase A/ROM repetitions to 15. Patient required min VC for form and technique. More independence with speed of exercises.    Plan P: Add 1# if speed and form are correct. Continue to provide cueing to slow down exercises. Follow up on theraband HEP.         Problem List Patient Active Problem List   Diagnosis Date Noted  . Near syncope 06/05/2012    Ailene Ravel, OTR/L,CBIS  [phone]  10/19/2015, 11:18 AM  [place], [place], [address] Phone: [phone]   Fax:  [phone]  Name: Shannon Brooks MRN: [id number] Date of Birth: 07/12/1986

## 2015-10-19 NOTE — Patient Instructions (Signed)

## 2015-10-21 ENCOUNTER — Encounter (HOSPITAL_COMMUNITY): Payer: Self-pay

## 2015-10-21 ENCOUNTER — Ambulatory Visit (HOSPITAL_COMMUNITY): Payer: BLUE CROSS/BLUE SHIELD

## 2015-10-21 DIAGNOSIS — R29898 Other symptoms and signs involving the musculoskeletal system: Secondary | ICD-10-CM

## 2015-10-21 DIAGNOSIS — M629 Disorder of muscle, unspecified: Secondary | ICD-10-CM

## 2015-10-21 DIAGNOSIS — M25511 Pain in right shoulder: Secondary | ICD-10-CM

## 2015-10-21 DIAGNOSIS — M6289 Other specified disorders of muscle: Secondary | ICD-10-CM

## 2015-10-21 DIAGNOSIS — M25611 Stiffness of right shoulder, not elsewhere classified: Secondary | ICD-10-CM

## 2015-10-21 NOTE — Therapy (Signed)
[place], [place], [address] Phone: [phone]   Fax:  [phone]  Occupational Therapy Treatment  Patient Details  Name: Shannon Brooks MRN: [id number] Date of Birth: 1985/09/17 Referring Provider: Celene Squibb, MD  Encounter Date: 10/21/2015      OT End of Session - 10/21/15 1027    Visit Number 5   Number of Visits 9   Date for OT Re-Evaluation 11/03/15   Authorization Type BCBS PPO   Authorization Time Period 16 visits for OT/PT/ST   Authorization - Visit Number 5   Authorization - Number of Visits 71   OT Start Time 0935   OT Stop Time 1015   OT Time Calculation (min) 40 min   Activity Tolerance Patient tolerated treatment well   Behavior During Therapy [facility] for tasks assessed/performed      Past Medical History  Diagnosis Date  . Asthma   . Depression   . Broken toe   . Carpal tunnel syndrome   . Obesity, unspecified     Obesity  . Near syncope Episodic near-syncope    Past Surgical History  Procedure Laterality Date  . Tonsillectomy      There were no vitals filed for this visit.  Visit Diagnosis:  Pain in joint of right shoulder  Tight fascia  Shoulder weakness  Shoulder stiffness, right      Subjective Assessment - 10/21/15 1026    Subjective  S: I forgot my caffiene and overslept but I feel good. No pain surprisingly!   Currently in Pain? No/denies            [facility] OT Assessment - 10/21/15 1035    Assessment   Diagnosis Right shoulder pain   Precautions   Precautions None                  OT Treatments/Exercises (OP) - 10/21/15 0938    Exercises   Exercises Shoulder   Shoulder Exercises: Supine   Protraction PROM;5 reps;Strengthening;10 reps   Protraction Weight (lbs) 1lb  wrist weight   Horizontal ABduction PROM;5 reps;Strengthening;10 reps   Horizontal ABduction Weight (lbs) 1lb  wrist weight   External Rotation PROM;5 reps   External  Rotation Weight (lbs) 1lb  wrist weight   Internal Rotation PROM;5 reps   Internal Rotation Weight (lbs) 1lb  wrist weight   Flexion PROM;5 reps;Strengthening;10 reps   Shoulder Flexion Weight (lbs) 1lb  wrist weight   ABduction PROM;5 reps;Strengthening;10 reps   Shoulder ABduction Weight (lbs) 1lb  wrist weight   Shoulder Exercises: Standing   Protraction Strengthening;10 reps   Protraction Weight (lbs) 1lb   wrist weight   Horizontal ABduction Strengthening;10 reps   Horizontal ABduction Weight (lbs) 1lb  wrist weight   External Rotation Strengthening;10 reps   External Rotation Weight (lbs) 1lb  wrist weight   Internal Rotation Strengthening;10 reps   Internal Rotation Weight (lbs) 1lb  wrist weight   Flexion Strengthening;10 reps   Shoulder Flexion Weight (lbs) 1lb  wrist weight   ABduction Strengthening;10 reps   Shoulder ABduction Weight (lbs) 1lb  wrist weight   Shoulder Exercises: ROM/Strengthening   UBE (Upper Arm Bike) Level 2 3' forward 3' reverse   Cybex Press 1 plate;15 reps   Cybex Row 2 plate;15 reps   "W" Arms 10X with 1lb wrist weight   X to V Arms 10X with 1lb wrist weight   Proximal Shoulder Strengthening, Supine 10X with 1lb wrist weight  Other ROM/Strengthening Exercises Proximal shoulder strengthening, standing, 10X with 1lb wrist weight   Manual Therapy   Manual Therapy Myofascial release   Manual therapy comments manual therapy completed prior to exercises   Myofascial Release Myofascial release and manual stretching completed to left upper arm, trapezius, and scapularis region as well as left teres major to decrease fascial restrictions and increase joint mobility in a pain free zone.                  OT Short Term Goals - 10/11/15 1600    OT SHORT TERM GOAL #1   Title Patient will be educated and independent with HEP to increase functional use of RUE during daily tasks.    Time 4   Period Weeks   Status On-going   OT SHORT TERM  GOAL #2   Title Patient will return to highest level of independence with all daily tasks using RUE.    Time 4   Period Weeks   Status On-going   OT SHORT TERM GOAL #3   Title Patient will decrease pain level to 3/10 or less in RUE to allow for more use during daily tasks.    Time 4   Period Weeks   Status On-going   OT SHORT TERM GOAL #4   Title Patient will decrease fascial restrictions to min amount to increase functional mobility.    Time 4   Period Weeks   Status On-going   OT SHORT TERM GOAL #5   Title Patient will increase RUE strength to 4+/5 to increase ability to return to normal daily tasks that require lifting light to moderate amount weight.    Time 4   Period Weeks   Status On-going                  Plan - 10/21/15 1028    Clinical Impression Statement A: With added 1lb weight, patient demonstrated increased ability to decrease speed of exercises to focus on strengthning muscles instead of using momentum. Patient complained of pain in R wrist with 1lb dumbells, therapist offered wrist weight instead and patient finished exercises without pain. Therapist provided verbal cues to maintain consistant speed control on arm bike.   Plan P: Continue to use 1# wrist weights for exercises in standing and supine. Reintroduce theraband exercises next session. Complete manual therapy PRN due to no fascial restrictions or decreased ROM this session.        Problem List Patient Active Problem List   Diagnosis Date Noted  . Near syncope 06/05/2012    Marijo Conception, OTA student [phone]   10/21/2015, 10:35 AM  [place] [address] [place], [place], [address] Phone: [phone]   Fax:  [phone]  Name: Charlisha Hamberg MRN: [id number] Date of Birth: 1985/10/02  Ailene Ravel, OTR/L,CBIS  [phone]  Note reviewed by clinical instructor and accurately reflects treatment session.

## 2015-10-26 ENCOUNTER — Encounter (HOSPITAL_COMMUNITY): Payer: BLUE CROSS/BLUE SHIELD

## 2015-10-28 ENCOUNTER — Encounter (HOSPITAL_COMMUNITY): Payer: Self-pay

## 2015-10-28 ENCOUNTER — Ambulatory Visit (HOSPITAL_COMMUNITY): Payer: BLUE CROSS/BLUE SHIELD | Attending: Internal Medicine

## 2015-10-28 DIAGNOSIS — M6281 Muscle weakness (generalized): Secondary | ICD-10-CM | POA: Insufficient documentation

## 2015-10-28 DIAGNOSIS — R29898 Other symptoms and signs involving the musculoskeletal system: Secondary | ICD-10-CM | POA: Insufficient documentation

## 2015-10-28 DIAGNOSIS — M25611 Stiffness of right shoulder, not elsewhere classified: Secondary | ICD-10-CM

## 2015-10-28 DIAGNOSIS — M25511 Pain in right shoulder: Secondary | ICD-10-CM | POA: Insufficient documentation

## 2015-10-28 NOTE — Therapy (Signed)
[place], [place], [address] Phone: [phone]   Fax:  [phone]  Occupational Therapy Treatment  Patient Details  Name: Shannon Brooks MRN: [id number] Date of Birth: Nov 01, 1985 Referring Provider: Celene Squibb, MD  Encounter Date: 10/28/2015      OT End of Session - 10/28/15 1017    Visit Number 6   Number of Visits 9   Date for OT Re-Evaluation 11/03/15   Authorization Type BCBS PPO   Authorization Time Period 81 visits for OT/PT/ST   Authorization - Visit Number 6   Authorization - Number of Visits 36   OT Start Time 0935   OT Stop Time 1015   OT Time Calculation (min) 40 min   Activity Tolerance Patient tolerated treatment well   Behavior During Therapy [facility] for tasks assessed/performed      Past Medical History  Diagnosis Date  . Asthma   . Depression   . Broken toe   . Carpal tunnel syndrome   . Obesity, unspecified     Obesity  . Near syncope Episodic near-syncope    Past Surgical History  Procedure Laterality Date  . Tonsillectomy      There were no vitals filed for this visit.  Visit Diagnosis:  Shoulder stiffness, right  Muscle weakness (generalized)  Pain in right shoulder      Subjective Assessment - 10/28/15 0939    Subjective  S: I feel a lot better, I'm about off my muscle relaxers completely.   Currently in Pain? Yes   Pain Score 1    Pain Location Shoulder   Pain Orientation Right   Pain Descriptors / Indicators Sore   Pain Type Chronic pain   Pain Onset More than a month ago   Pain Frequency Constant   Aggravating Factors  Movement   Pain Relieving Factors rest, medications   Effect of Pain on Daily Activities limited ability to use during daily tasks   Multiple Pain Sites No            [facility] OT Assessment - 10/28/15 0946    Assessment   Diagnosis Right shoulder pain   Precautions   Precautions None                  OT  Treatments/Exercises (OP) - 10/28/15 0946    Exercises   Exercises Shoulder   Shoulder Exercises: Supine   Protraction PROM;5 reps;Strengthening;10 reps   Protraction Weight (lbs) 1lb  wrist weight   Horizontal ABduction PROM;5 reps;Strengthening;10 reps   Horizontal ABduction Weight (lbs) 1lb  wrist weight   External Rotation PROM;5 reps;Strengthening;10 reps   External Rotation Weight (lbs) 1lb  wrist weight   Internal Rotation PROM;5 reps;Strengthening;10 reps   Internal Rotation Weight (lbs) 1lb  wrist weight   Flexion PROM;5 reps;Strengthening;10 reps   Shoulder Flexion Weight (lbs) 1lb  wrist weight   ABduction PROM;5 reps;Strengthening;10 reps   Shoulder ABduction Weight (lbs) 1lb  wrist weight   Shoulder Exercises: Prone   Other Prone Exercises [place] exercises 10X with 1lb wrist weight  Did not use weight for extension   Shoulder Exercises: Standing   Protraction Strengthening;10 reps   Protraction Weight (lbs) 1lb   wrist weight   Horizontal ABduction Strengthening;10 reps   Horizontal ABduction Weight (lbs) 1lb  wrist weight   External Rotation Strengthening;10 reps   External Rotation Weight (lbs) 1lb  wrist weight   Internal Rotation Strengthening;10 reps   Internal Rotation Weight (  lbs) 1lb  wrist weight   Flexion Strengthening;10 reps   Shoulder Flexion Weight (lbs) 1lb  wrist weight   ABduction Strengthening;10 reps   Shoulder ABduction Weight (lbs) 1lb  wrist weight   Extension Theraband;12 reps   Theraband Level (Shoulder Extension) Level 2 (Red)   Row Theraband;12 reps   Theraband Level (Shoulder Row) Level 2 (Red)   Retraction Theraband;12 reps   Theraband Level (Shoulder Retraction) Level 2 (Red)   Shoulder Exercises: ROM/Strengthening   UBE (Upper Arm Bike) Level 2 3' reverse   "W" Arms 10X with 1lb wrist weight   X to V Arms 10X with 1lb wrist weight   Proximal Shoulder Strengthening, Supine 10X with 1lb wrist weight   Ball on Wall 1'  flexion 1' abduction with 1lb wrist weight  modified to use washcloth   Other ROM/Strengthening Exercises --   Manual Therapy   Manual Therapy Myofascial release   Manual therapy comments manual therapy completed prior to exercises   Myofascial Release Myofascial release and manual stretching completed to left upper arm, trapezius, and scapularis region as well as left teres major to decrease fascial restrictions and increase joint mobility in a pain free zone.                  OT Short Term Goals - 10/11/15 1600    OT SHORT TERM GOAL #1   Title Patient will be educated and independent with HEP to increase functional use of RUE during daily tasks.    Time 4   Period Weeks   Status On-going   OT SHORT TERM GOAL #2   Title Patient will return to highest level of independence with all daily tasks using RUE.    Time 4   Period Weeks   Status On-going   OT SHORT TERM GOAL #3   Title Patient will decrease pain level to 3/10 or less in RUE to allow for more use during daily tasks.    Time 4   Period Weeks   Status On-going   OT SHORT TERM GOAL #4   Title Patient will decrease fascial restrictions to min amount to increase functional mobility.    Time 4   Period Weeks   Status On-going   OT SHORT TERM GOAL #5   Title Patient will increase RUE strength to 4+/5 to increase ability to return to normal daily tasks that require lifting light to moderate amount weight.    Time 4   Period Weeks   Status On-going                  Plan - 10/28/15 1017    Clinical Impression Statement A: Added [place] exercises in prone with 1lb wrist weight this session. Patient complained of pain during extension movement, therapist removed 1 lb weight and patient finished exercises. Also added ball on wall exercise but used washcloth instead due to carpal tunnel pain, patient completed 26min in flexion and 81min in extension.   Plan P: Complete reassment next session. Continue with 1lb  weight during exercises. Complete manual therapy PRN.        Problem List Patient Active Problem List   Diagnosis Date Noted  . Near syncope 06/05/2012    Ailene Ravel, OTR/L,CBIS  [phone]  10/28/2015, 10:22 AM  [place] [address] [place], [place], [address] Phone: [phone]   Fax:  [phone]  Name: Shannon Brooks MRN: [id number] Date of Birth: 1986-04-17

## 2015-11-02 ENCOUNTER — Encounter (HOSPITAL_COMMUNITY): Payer: Self-pay

## 2015-11-02 ENCOUNTER — Ambulatory Visit (HOSPITAL_COMMUNITY): Payer: BLUE CROSS/BLUE SHIELD

## 2015-11-02 DIAGNOSIS — M25511 Pain in right shoulder: Secondary | ICD-10-CM

## 2015-11-02 DIAGNOSIS — M25611 Stiffness of right shoulder, not elsewhere classified: Secondary | ICD-10-CM | POA: Diagnosis not present

## 2015-11-02 DIAGNOSIS — R29898 Other symptoms and signs involving the musculoskeletal system: Secondary | ICD-10-CM

## 2015-11-02 DIAGNOSIS — M6281 Muscle weakness (generalized): Secondary | ICD-10-CM | POA: Diagnosis not present

## 2015-11-02 NOTE — Patient Instructions (Signed)
Strengthening: Chest Pull - Resisted   Hold Theraband in front of body with hands about shoulder width a part. Pull band a part and back together slowly. Repeat __12-15__ times. Complete __1__ set(s) per session.. Repeat ____ session(s) per day.  http://orth.exer.us/926   Copyright  VHI. All rights reserved.   PNF Strengthening: Resisted   Standing with resistive band around each hand, bring right arm up and away, thumb back. Repeat __12-15__ times per set. Do __1__ sets per session. Do ____ sessions per day.      Resisted External Rotation: in Neutral - Bilateral   Sit or stand, tubing in both hands, elbows at sides, bent to 90, forearms forward. Pinch shoulder blades together and rotate forearms out. Keep elbows at sides. Repeat _12-15___ times per set. Do _1___ sets per session. Do ____ sessions per day.  http://orth.exer.us/966   Copyright  VHI. All rights reserved.   PNF Strengthening: Resisted   Standing, hold resistive band above head. Bring right arm down and out from side. Repeat _12-15___ times per set. Do __1__ sets per session. Do ____ sessions per day.  http://orth.exer.us/922   Copyright  VHI. All rights reserved.   

## 2015-11-02 NOTE — Therapy (Signed)
[place], [place], [address] Phone: [phone]   Fax:  [phone]  Occupational Therapy Treatment  Patient Details  Name: Shannon Brooks MRN: [id number] Date of Birth: 1985-09-04 Referring Provider: Celene Squibb, MD  Encounter Date: 11/02/2015      OT End of Session - 11/02/15 1025    Visit Number 7   Number of Visits 9   Date for OT Re-Evaluation --   Authorization Type [employer] PPO   Authorization Time Period 68 visits for OT/PT/ST   Authorization - Visit Number 7   Authorization - Number of Visits 3   OT Start Time 0950  D/C today   OT Stop Time 1025   OT Time Calculation (min) 35 min   Activity Tolerance Patient tolerated treatment well   Behavior During Therapy [facility] for tasks assessed/performed      Past Medical History  Diagnosis Date  . Asthma   . Depression   . Broken toe   . Carpal tunnel syndrome   . Obesity, unspecified     Obesity  . Near syncope Episodic near-syncope    Past Surgical History  Procedure Laterality Date  . Tonsillectomy      There were no vitals filed for this visit.      Subjective Assessment - 11/02/15 0947    Subjective  S: No pain, I've been cleaning and I'm fine.   Special Tests FOTO score: 79/100   Currently in Pain? No/denies            [facility] OT Assessment - 11/02/15 0001    Assessment   Diagnosis Right shoulder pain   Precautions   Precautions None   AROM   Overall AROM Comments Assessed seated. IR/er adducted   AROM Assessment Site Shoulder   Right/Left Shoulder Right   Right Shoulder Flexion 180 Degrees  previous: 166   Right Shoulder ABduction 180 Degrees  previous: 160   Right Shoulder Internal Rotation 90 Degrees  previous: same   Right Shoulder External Rotation 90 Degrees  previous: same   PROM   Overall PROM  Within functional limits for tasks performed   Strength   Overall Strength Comments Assessed seated. IR/er adducted   Strength Assessment Site Shoulder   Right/Left Shoulder Right   Right Shoulder Flexion 5/5  previous: 3+/5   Right Shoulder ABduction 5/5  previous: 3+/5   Right Shoulder Internal Rotation 5/5  previous: same   Right Shoulder External Rotation 5/5  previous: 4+/5                  OT Treatments/Exercises (OP) - 11/02/15 0001    Shoulder Exercises: Standing   Horizontal ABduction Theraband;10 reps   Theraband Level (Shoulder Horizontal ABduction) Level 2 (Red)   External Rotation Theraband;10 reps   Theraband Level (Shoulder External Rotation) Level 2 (Red)   Internal Rotation Theraband;10 reps   Theraband Level (Shoulder Internal Rotation) Level 2 (Red)   ABduction Theraband;10 reps   Theraband Level (Shoulder ABduction) Level 2 (Red)   Other Standing Exercises ADduction with red Theraband 10X   Manual Therapy   Manual Therapy Myofascial release   Manual therapy comments manual therapy completed prior to exercises   Myofascial Release Myofascial release and manual stretching completed to left upper arm, trapezius, and scapularis region as well as left teres major to decrease fascial restrictions and increase joint mobility in a pain free zone.  OT Education - 11/02/15 1011    Education provided Yes   Education Details Theraband strengthening   Person(s) Educated Patient   Methods Explanation;Demonstration;Verbal cues;Handout;Tactile cues   Comprehension Verbalized understanding;Returned demonstration          OT Short Term Goals - 11/02/15 1010    OT SHORT TERM GOAL #1   Title Patient will be educated and independent with HEP to increase functional use of RUE during daily tasks.    Time 4   Period Weeks   Status Achieved   OT SHORT TERM GOAL #2   Title Patient will return to highest level of independence with all daily tasks using RUE.    Time 4   Period Weeks   Status Achieved   OT SHORT TERM GOAL #3   Title Patient will decrease  pain level to 3/10 or less in RUE to allow for more use during daily tasks.    Time 4   Period Weeks   Status Achieved   OT SHORT TERM GOAL #4   Title Patient will decrease fascial restrictions to min amount to increase functional mobility.    Time 4   Period Weeks   Status Achieved   OT SHORT TERM GOAL #5   Title Patient will increase RUE strength to 4+/5 to increase ability to return to normal daily tasks that require lifting light to moderate amount weight.    Time 4   Period Weeks   Status Achieved                  Plan - 11/02/15 1025    Clinical Impression Statement A: Met all STGs, patient provided with Theraband HEP. Patient reports she is back to prior level of function and has increased awareness of limitations.   Plan P: D/C from therapy with HEP.      Patient will benefit from skilled therapeutic intervention in order to improve the following deficits and impairments:  Decreased strength, Pain, Impaired UE functional use, Decreased range of motion, Increased fascial restricitons, Increased muscle spasms  Visit Diagnosis: Shoulder stiffness, right  Pain in right shoulder  Other symptoms and signs involving the musculoskeletal system    Problem List Patient Active Problem List   Diagnosis Date Noted  . Near syncope 06/05/2012   OCCUPATIONAL THERAPY DISCHARGE SUMMARY  Visits from Start of Care: 7  Current functional level related to goals / functional outcomes: See above   Remaining deficits: See above   Education / Equipment: See above Plan: Patient agrees to discharge.  Patient goals were met. Patient is being discharged due to meeting the stated rehab goals.  ?????       Ailene Ravel, OTR/L,CBIS  [phone]  11/02/2015, 10:30 AM  [place] [address] [place], [place], [address] Phone: [phone]   Fax:  [phone]  Name: Shannon Brooks MRN: [id number] Date of Birth:  March 31, 1986

## 2015-11-04 ENCOUNTER — Ambulatory Visit (HOSPITAL_COMMUNITY): Payer: BLUE CROSS/BLUE SHIELD

## 2015-12-24 DIAGNOSIS — L309 Dermatitis, unspecified: Secondary | ICD-10-CM | POA: Diagnosis not present

## 2016-04-12 DIAGNOSIS — R55 Syncope and collapse: Secondary | ICD-10-CM | POA: Diagnosis not present

## 2016-04-12 DIAGNOSIS — R42 Dizziness and giddiness: Secondary | ICD-10-CM | POA: Diagnosis not present

## 2016-04-26 ENCOUNTER — Other Ambulatory Visit (HOSPITAL_COMMUNITY): Payer: Self-pay | Admitting: Respiratory Therapy

## 2017-01-23 DIAGNOSIS — R42 Dizziness and giddiness: Secondary | ICD-10-CM | POA: Diagnosis not present

## 2017-02-13 DIAGNOSIS — H5500 Unspecified nystagmus: Secondary | ICD-10-CM | POA: Diagnosis not present

## 2017-02-13 DIAGNOSIS — H539 Unspecified visual disturbance: Secondary | ICD-10-CM | POA: Diagnosis not present

## 2017-03-01 DIAGNOSIS — Z6841 Body Mass Index (BMI) 40.0 and over, adult: Secondary | ICD-10-CM | POA: Diagnosis not present

## 2017-03-01 DIAGNOSIS — J019 Acute sinusitis, unspecified: Secondary | ICD-10-CM | POA: Diagnosis not present

## 2017-04-10 ENCOUNTER — Encounter: Payer: Self-pay | Admitting: Neurology

## 2017-04-10 ENCOUNTER — Ambulatory Visit (INDEPENDENT_AMBULATORY_CARE_PROVIDER_SITE_OTHER): Payer: BLUE CROSS/BLUE SHIELD | Admitting: Neurology

## 2017-04-10 DIAGNOSIS — G43909 Migraine, unspecified, not intractable, without status migrainosus: Secondary | ICD-10-CM | POA: Insufficient documentation

## 2017-04-10 DIAGNOSIS — G43109 Migraine with aura, not intractable, without status migrainosus: Secondary | ICD-10-CM

## 2017-04-10 MED ORDER — SUMATRIPTAN SUCCINATE 50 MG PO TABS: 50.0000 mg | ORAL_TABLET | ORAL | 6 refills | Status: DC | PRN

## 2017-04-10 MED ORDER — TOPIRAMATE 100 MG PO TABS: 100.0000 mg | ORAL_TABLET | Freq: Two times a day (BID) | ORAL | 11 refills | Status: DC

## 2017-04-10 NOTE — Progress Notes (Signed)
PATIENT: Shannon Brooks DOB: [birth year]  Chief Complaint  Patient presents with  . Dizziness    She is here with her husband, Shannon Brooks.  Orhtostatic Vitals: Lying: 120/82, 75, Sitting: 134/89, 92, Standing: 140/84, 94, Standing x 3 mintues: 136/93, 92.  She initially started having dizzy spells in 2014 that she felt improved after starting birth control.  She started having more frequent spells in 05/2016 that she felt were triggered by eating salty foods, increased stress, heat and weather changes.  She stopped taking birth control in January 2018 because she is trying to become pregnant.    Marland Kitchen PCP    Celene Squibb, MD     HISTORICAL  Shannon Brooks is a 31 years old female, seen in refer by his primary care doctor Celene Squibb for evaluation of dizziness, initial evaluation was on April 10 2017.  I reviewed and summarized the referring note, she had a past medical history of chronic migraine, obese, presented with frequent vertigo with associated headache since 2017   She had migraine headaches since 31 years old, her typical migraine are lateralized severe pounding headache with associated light noise sensitivity, nauseous, lasting for a few hours, was helped by caffeine, over-the-counter Excedrin Migraine, Tylenol ibuprofen as needed, sometimes her migraines are preceded by auras, visual distortion, dizziness,  She also complains of intermittent vertigo episodes since 2013, gradually getting worse especially since 2017, over the past few months, she has it almost on a weekly basis, sudden onset of unsteady gait, difficulty focusing, brain foggy sensation, lasting for few hours, caffeine usually helps her symptoms temporarily, oftentimes is followed by severe migraine headaches,  Stopping birth control in March 2018 seems to make her symptoms worse   REVIEW OF SYSTEMS: Full 14 system review of systems performed and notable only for chills, fatigue, spinning sensation, blurry  vision, allergy, confusion, headaches, weakness, dizziness   ALLERGIES: Allergies  Allergen Reactions  . Penicillins   . Desipramine Rash    HOME MEDICATIONS: Current Outpatient Prescriptions  Medication Sig Dispense Refill  . albuterol (PROVENTIL HFA;VENTOLIN HFA) 108 (90 BASE) MCG/ACT inhaler Inhale 2 puffs into the lungs every 6 (six) hours as needed for wheezing or shortness of breath.     Marland Kitchen albuterol (PROVENTIL) (5 MG/ML) 0.5% nebulizer solution Take 0.5 mLs (2.5 mg total) by nebulization every 6 (six) hours as needed for wheezing or shortness of breath. 20 mL 12  . cetirizine (ZYRTEC) 10 MG chewable tablet Chew 10 mg by mouth daily.    Marland Kitchen ibuprofen (ADVIL,MOTRIN) 800 MG tablet Take 800 mg by mouth every 8 (eight) hours as needed.    . norgestimate-ethinyl estradiol (ORTHO-CYCLEN,SPRINTEC,PREVIFEM) 0.25-35 MG-MCG tablet Take 1 tablet by mouth daily.    . Pseudoephedrine HCl (SUDAFED PO) Take by mouth daily.     No current facility-administered medications for this visit.     PAST MEDICAL HISTORY: Past Medical History:  Diagnosis Date  . Asthma   . Broken toe   . Carpal tunnel syndrome   . Depression   . Near syncope Episodic near-syncope  . Obesity, unspecified    Obesity  . Vertigo     PAST SURGICAL HISTORY: Past Surgical History:  Procedure Laterality Date  . TONSILLECTOMY      FAMILY HISTORY: Family History  Problem Relation Age of Onset  . Cancer Mother   . Muscular dystrophy Father     SOCIAL HISTORY:  Social History   Social History  . Marital status: Married  Spouse name: N/A  . Number of children: 0  . Years of education: some college   Occupational History  . Unemployed    Social History Main Topics  . Smoking status: Never Smoker  . Smokeless tobacco: Never Used  . Alcohol use Yes     Comment: Drinks alcohol on occasion  . Drug use: No  . Sexual activity: Yes    Birth control/ protection: None   Other Topics Concern  . Not on file    Social History Narrative   Lives at home with spouse.   Right-handed.    Occasional caffeine use.     PHYSICAL EXAM   Vitals:   04/10/17 1356  BP: 120/82  Pulse: 75  Weight: (!) 312 lb (141.5 kg)  Height: 5\' 6"  (1.676 m)    Not recorded      Body mass index is 50.36 kg/m.  PHYSICAL EXAMNIATION:  Gen: NAD, conversant, well nourised, obese, well groomed                     Cardiovascular: Regular rate rhythm, no peripheral edema, warm, nontender. Eyes: Conjunctivae clear without exudates or hemorrhage Neck: Supple, no carotid bruits. Pulmonary: Clear to auscultation bilaterally   NEUROLOGICAL EXAM:  MENTAL STATUS: Speech:    Speech is normal; fluent and spontaneous with normal comprehension.  Cognition:     Orientation to time, place and person     Normal recent and remote memory     Normal Attention span and concentration     Normal Language, naming, repeating,spontaneous speech     Fund of knowledge   CRANIAL NERVES: CN II: Visual fields are full to confrontation. Fundoscopic exam is normal with sharp discs and no vascular changes. Pupils are round equal and briskly reactive to light. CN III, IV, VI: extraocular movement are normal. No ptosis. CN V: Facial sensation is intact to pinprick in all 3 divisions bilaterally. Corneal responses are intact.  CN VII: Face is symmetric with normal eye closure and smile. CN VIII: Hearing is normal to rubbing fingers CN IX, X: Palate elevates symmetrically. Phonation is normal. CN XI: Head turning and shoulder shrug are intact CN XII: Tongue is midline with normal movements and no atrophy.  MOTOR: There is no pronator drift of out-stretched arms. Muscle bulk and tone are normal. Muscle strength is normal.  REFLEXES: Reflexes are 2+ and symmetric at the biceps, triceps, knees, and ankles. Plantar responses are flexor.  SENSORY: Intact to light touch, pinprick, positional sensation and vibratory sensation are intact  in fingers and toes.  COORDINATION: Rapid alternating movements and fine finger movements are intact. There is no dysmetria on finger-to-nose and heel-knee-shin.    GAIT/STANCE: Posture is normal. Gait is steady with normal steps, base, arm swing, and turning. Heel and toe walking are normal. Tandem gait is normal.  Romberg is absent.   DIAGNOSTIC DATA (LABS, IMAGING, TESTING) - I reviewed patient records, labs, notes, testing and imaging myself where available.   ASSESSMENT AND PLAN  Shannon Brooks is a 31 y.o. female   Complicated migraine headaches  Start preventive medications Topamax 100 mg twice a day  Imitrex 50 mg as needed   Marcial Pacas, M.D. Ph.D.  [facility] [address], [place], [place] [address] Ph: [phone] Fax: [phone]  CC: Celene Squibb, MD

## 2017-05-31 DIAGNOSIS — Z0000 Encounter for general adult medical examination without abnormal findings: Secondary | ICD-10-CM | POA: Diagnosis not present

## 2017-06-04 DIAGNOSIS — Z0000 Encounter for general adult medical examination without abnormal findings: Secondary | ICD-10-CM | POA: Diagnosis not present

## 2017-07-05 ENCOUNTER — Encounter: Payer: Self-pay | Admitting: Advanced Practice Midwife

## 2017-07-05 ENCOUNTER — Other Ambulatory Visit (HOSPITAL_COMMUNITY)
Admission: RE | Admit: 2017-07-05 | Discharge: 2017-07-05 | Disposition: A | Discharge status: Home / Self Care | Payer: BLUE CROSS/BLUE SHIELD | Source: Ambulatory Visit | Attending: Advanced Practice Midwife | Admitting: Advanced Practice Midwife

## 2017-07-05 ENCOUNTER — Ambulatory Visit (INDEPENDENT_AMBULATORY_CARE_PROVIDER_SITE_OTHER): Payer: BLUE CROSS/BLUE SHIELD | Admitting: Advanced Practice Midwife

## 2017-07-05 ENCOUNTER — Encounter (INDEPENDENT_AMBULATORY_CARE_PROVIDER_SITE_OTHER): Payer: Self-pay

## 2017-07-05 VITALS — BP 142/92 | HR 77 | Ht 65.0 in | Wt 305.5 lb

## 2017-07-05 DIAGNOSIS — Z124 Encounter for screening for malignant neoplasm of cervix: Secondary | ICD-10-CM | POA: Diagnosis not present

## 2017-07-05 DIAGNOSIS — Z3202 Encounter for pregnancy test, result negative: Secondary | ICD-10-CM | POA: Diagnosis not present

## 2017-07-05 DIAGNOSIS — G43109 Migraine with aura, not intractable, without status migrainosus: Secondary | ICD-10-CM

## 2017-07-05 LAB — POCT URINE PREGNANCY: Preg Test, Ur: NEGATIVE

## 2017-07-05 MED ORDER — NORETHINDRONE 0.35 MG PO TABS: 1.0000 | ORAL_TABLET | Freq: Every day | ORAL | 11 refills | Status: DC

## 2017-07-05 NOTE — Patient Instructions (Signed)
Oral Contraception Information Oral contraceptive pills (OCPs) are medicines taken to prevent pregnancy. OCPs work by preventing the ovaries from releasing eggs. The hormones in OCPs also cause the cervical mucus to thicken, preventing the sperm from entering the uterus. The hormones also cause the uterine lining to become thin, not allowing a fertilized egg to attach to the inside of the uterus. OCPs are highly effective when taken exactly as prescribed. However, OCPs do not prevent sexually transmitted diseases (STDs). Safe sex practices, such as using condoms along with the pill, can help prevent STDs. Before taking the pill, you may have a physical exam and Pap test. Your health care provider may order blood tests. The health care provider will make sure you are a good candidate for oral contraception. Discuss with your health care provider the possible side effects of the OCP you may be prescribed. When starting an OCP, it can take 2 to 3 months for the body to adjust to the changes in hormone levels in your body. Types of oral contraception  The combination pill-This pill contains estrogen and progestin (synthetic progesterone) hormones. The combination pill comes in 21-day, 28-day, or 91-day packs. Some types of combination pills are meant to be taken continuously (365-day pills). With 21-day packs, you do not take pills for 7 days after the last pill. With 28-day packs, the pill is taken every day. The last 7 pills are without hormones. Certain types of pills have more than 21 hormone-containing pills. With 91-day packs, the first 84 pills contain both hormones, and the last 7 pills contain no hormones or contain estrogen only.  The minipill-This pill contains the progesterone hormone only. The pill is taken every day continuously. It is very important to take the pill at the same time each day. The minipill comes in packs of 28 pills. All 28 pills contain the hormone. Advantages of oral  contraceptive pills  Decreases premenstrual symptoms.  Treats menstrual period cramps.  Regulates the menstrual cycle.  Decreases a heavy menstrual flow.  May treatacne, depending on the type of pill.  Treats abnormal uterine bleeding.  Treats polycystic ovarian syndrome.  Treats endometriosis.  Can be used as emergency contraception. Things that can make oral contraceptive pills less effective OCPs can be less effective if:  You forget to take the pill at the same time every day.  You have a stomach or intestinal disease that lessens the absorption of the pill.  You take OCPs with other medicines that make OCPs less effective, such as antibiotics, certain HIV medicines, and some seizure medicines.  You take expired OCPs.  You forget to restart the pill on day 7, when using the packs of 21 pills.  Risks associated with oral contraceptive pills Oral contraceptive pills can sometimes cause side effects, such as:  Headache.  Nausea.  Breast tenderness.  Irregular bleeding or spotting.  Combination pills are also associated with a small increased risk of:  Blood clots.  Heart attack.  Stroke.  This information is not intended to replace advice given to you by your health care provider. Make sure you discuss any questions you have with your health care provider. Document Released: 09/30/2002 Document Revised: 12/16/2015 Document Reviewed: 12/29/2012 Elsevier Interactive Patient Education  2018 Elsevier Inc.  

## 2017-07-05 NOTE — Progress Notes (Signed)
[facility] Visit  Patient name: Shannon Brooks MRN [id number]  Date of birth: [birth year]  CC & HPI:  Shannon Brooks is a 31 y.o. Caucasian female presenting today for PAP ONLY and wants to start COCs.    Pertinent History Reviewed:  Medical & Surgical Hx:   Past Medical History:  Diagnosis Date  . Asthma   . Broken toe   . Carpal tunnel syndrome   . Migraine   . Near syncope Episodic near-syncope  . Obesity, unspecified    Obesity  . Vertigo    Past Surgical History:  Procedure Laterality Date  . TONSILLECTOMY     Family History  Problem Relation Age of Onset  . Deafness Mother     Current Outpatient Medications:  .  albuterol (PROVENTIL HFA;VENTOLIN HFA) 108 (90 BASE) MCG/ACT inhaler, Inhale 2 puffs into the lungs every 6 (six) hours as needed for wheezing or shortness of breath. , Disp: , Rfl:  .  albuterol (PROVENTIL) (5 MG/ML) 0.5% nebulizer solution, Take 0.5 mLs (2.5 mg total) by nebulization every 6 (six) hours as needed for wheezing or shortness of breath., Disp: 20 mL, Rfl: 12 .  cetirizine (ZYRTEC) 10 MG chewable tablet, Chew 10 mg by mouth daily., Disp: , Rfl:  .  ibuprofen (ADVIL,MOTRIN) 800 MG tablet, Take 800 mg by mouth every 8 (eight) hours as needed., Disp: , Rfl:  .  Pseudoephedrine HCl (SUDAFED PO), Take by mouth daily., Disp: , Rfl:  .  SUMAtriptan (IMITREX) 50 MG tablet, Take 1 tablet (50 mg total) by mouth every 2 (two) hours as needed for migraine. May repeat in 2 hours if headache persists or recurs., Disp: 12 tablet, Rfl: 6 .  topiramate (TOPAMAX) 100 MG tablet, Take 1 tablet (100 mg total) by mouth 2 (two) times daily., Disp: 60 tablet, Rfl: 11 .  norethindrone (ORTHO MICRONOR) 0.35 MG tablet, Take 1 tablet (0.35 mg total) by mouth daily., Disp: 1 Package, Rfl: 11 Social History: Reviewed -  reports that  has never smoked. she has never used smokeless tobacco.  Review of Systems:   Constitutional: Negative for fever and chills Eyes:  Negative for visual disturbances Respiratory: Negative for shortness of breath, dyspnea Cardiovascular: Negative for chest pain or palpitations  Gastrointestinal: Negative for vomiting, diarrhea and constipation; no abdominal pain Genitourinary: Negative for dysuria and urgency, vaginal irritation or itching Musculoskeletal: Negative for back pain, joint pain, myalgias  Neurological: Negative for dizziness and headaches    Objective Findings:    Physical Examination: General appearance - well appearing, and in no distress Mental status - alert, oriented to person, place, and time Chest:  Normal respiratory effort Heart - normal rate and regular rhythm Abdomen:  Soft, nontender Pelvic: normal appearing, cx bulbous, non friable.  Bimanual limited to body habitus and low tolerance, but no masses or tenderness.  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    Return in about 4 months (around 11/03/2017) for med check.  CRESENZO-DISHMAN,Jefferie Holston CNM 07/05/2017 2:28 PM

## 2017-07-09 LAB — CYTOLOGY - PAP
Diagnosis: NEGATIVE
HPV (WINDOPATH): NOT DETECTED

## 2017-07-10 ENCOUNTER — Ambulatory Visit (INDEPENDENT_AMBULATORY_CARE_PROVIDER_SITE_OTHER): Payer: BLUE CROSS/BLUE SHIELD | Admitting: Neurology

## 2017-07-10 ENCOUNTER — Encounter: Payer: Self-pay | Admitting: Neurology

## 2017-07-10 VITALS — BP 138/85 | HR 83 | Ht 65.0 in | Wt 301.5 lb

## 2017-07-10 DIAGNOSIS — R42 Dizziness and giddiness: Secondary | ICD-10-CM

## 2017-07-10 DIAGNOSIS — G43109 Migraine with aura, not intractable, without status migrainosus: Secondary | ICD-10-CM

## 2017-07-10 MED ORDER — TOPIRAMATE 100 MG PO TABS: 100.0000 mg | ORAL_TABLET | Freq: Two times a day (BID) | ORAL | 4 refills | Status: DC

## 2017-07-10 NOTE — Progress Notes (Signed)
PATIENT: Shannon Brooks DOB: Feb 15, 1986  Chief Complaint  Patient presents with  . Migraine    She estimates getting one severe migraine per month that responds well to Imitrex. Her symptoms have improved with Topamax. She has noticed mild to moderate, intermittent body tremors that seem to occur more since starting the Topamax.     HISTORICAL  Shannon Brooks is a 31 years old female, seen in refer by his primary care doctor Celene Squibb for evaluation of dizziness, initial evaluation was on April 10 2017.  I reviewed and summarized the referring note, she had a past medical history of chronic migraine, obese, presented with frequent vertigo with associated headache since 2017   She had migraine headaches since 31 years old, her typical migraine are lateralized severe pounding headache with associated light noise sensitivity, nauseous, lasting for a few hours, was helped by caffeine, over-the-counter Excedrin Migraine, Tylenol ibuprofen as needed, sometimes her migraines are preceded by auras, visual distortion, dizziness,  She also complains of intermittent vertigo episodes since 2013, gradually getting worse especially since 2017, over the past few months, she has it almost on a weekly basis, sudden onset of unsteady gait, difficulty focusing, brain foggy sensation, lasting for few hours, caffeine usually helps her symptoms temporarily, oftentimes is followed by severe migraine headaches,  Stopping birth control in March 2018 seems to make her symptoms worse   Update July 10, 2017: She is now taking Topamax 100 mg twice a day, doing very well, lost 11 pounds over past 3 months, Imitrex as needed works well for her migraine headaches, and recurrent dizzy spells, continue have recurrent spells of lightheadedness, whole body tremor, without loss consciousness lasting for a few minutes, not necessarily associated with headaches, REVIEW OF SYSTEMS: Full 14 system review of  systems performed and notable only for light-sensitive, blurred vision, diarrhea, joint pain, memory loss, dizziness, headaches, speech difficulty, tremor, agitation, confusion, hallucinations  ALLERGIES: Allergies  Allergen Reactions  . Penicillins   . Desipramine Rash    HOME MEDICATIONS: Current Outpatient Medications  Medication Sig Dispense Refill  . albuterol (PROVENTIL HFA;VENTOLIN HFA) 108 (90 BASE) MCG/ACT inhaler Inhale 2 puffs into the lungs every 6 (six) hours as needed for wheezing or shortness of breath.     Marland Kitchen albuterol (PROVENTIL) (5 MG/ML) 0.5% nebulizer solution Take 0.5 mLs (2.5 mg total) by nebulization every 6 (six) hours as needed for wheezing or shortness of breath. 20 mL 12  . cetirizine (ZYRTEC) 10 MG chewable tablet Chew 10 mg by mouth daily.    Marland Kitchen ibuprofen (ADVIL,MOTRIN) 800 MG tablet Take 800 mg by mouth every 8 (eight) hours as needed.    . norethindrone (ORTHO MICRONOR) 0.35 MG tablet Take 1 tablet (0.35 mg total) by mouth daily. 1 Package 11  . Pseudoephedrine HCl (SUDAFED PO) Take by mouth daily.    . SUMAtriptan (IMITREX) 50 MG tablet Take 1 tablet (50 mg total) by mouth every 2 (two) hours as needed for migraine. May repeat in 2 hours if headache persists or recurs. 12 tablet 6  . topiramate (TOPAMAX) 100 MG tablet Take 1 tablet (100 mg total) by mouth 2 (two) times daily. 60 tablet 11   No current facility-administered medications for this visit.     PAST MEDICAL HISTORY: Past Medical History:  Diagnosis Date  . Asthma   . Broken toe   . Carpal tunnel syndrome   . Migraine   . Near syncope Episodic near-syncope  . Obesity, unspecified  Obesity  . Vertigo     PAST SURGICAL HISTORY: Past Surgical History:  Procedure Laterality Date  . TONSILLECTOMY      FAMILY HISTORY: Family History  Problem Relation Age of Onset  . Deafness Mother     SOCIAL HISTORY:  Social History   Socioeconomic History  . Marital status: Married    Spouse  name: Not on file  . Number of children: 0  . Years of education: some college  . Highest education level: Not on file  Social Needs  . Financial resource strain: Not on file  . Food insecurity - worry: Not on file  . Food insecurity - inability: Not on file  . Transportation needs - medical: Not on file  . Transportation needs - non-medical: Not on file  Occupational History  . Occupation: Unemployed  Tobacco Use  . Smoking status: Never Smoker  . Smokeless tobacco: Never Used  Substance and Sexual Activity  . Alcohol use: No    Frequency: Never  . Drug use: No  . Sexual activity: Yes    Birth control/protection: None  Other Topics Concern  . Not on file  Social History Narrative   Lives at home with spouse.   Right-handed.    Occasional caffeine use.     PHYSICAL EXAM   Vitals:   07/10/17 1308  BP: 138/85  Pulse: 83  Weight: (!) 301 lb 8 oz (136.8 kg)  Height: 5\' 5"  (1.651 m)    Not recorded      Body mass index is 50.17 kg/m.  PHYSICAL EXAMNIATION:  Gen: NAD, conversant, well nourised, obese, well groomed                     Cardiovascular: Regular rate rhythm, no peripheral edema, warm, nontender. Eyes: Conjunctivae clear without exudates or hemorrhage Neck: Supple, no carotid bruits. Pulmonary: Clear to auscultation bilaterally   NEUROLOGICAL EXAM:  MENTAL STATUS: Speech:    Speech is normal; fluent and spontaneous with normal comprehension.  Cognition:     Orientation to time, place and person     Normal recent and remote memory     Normal Attention span and concentration     Normal Language, naming, repeating,spontaneous speech     Fund of knowledge   CRANIAL NERVES: CN II: Visual fields are full to confrontation. Fundoscopic exam is normal with sharp discs and no vascular changes. Pupils are round equal and briskly reactive to light. CN III, IV, VI: extraocular movement are normal. No ptosis. CN V: Facial sensation is intact to pinprick in  all 3 divisions bilaterally. Corneal responses are intact.  CN VII: Face is symmetric with normal eye closure and smile. CN VIII: Hearing is normal to rubbing fingers CN IX, X: Palate elevates symmetrically. Phonation is normal. CN XI: Head turning and shoulder shrug are intact CN XII: Tongue is midline with normal movements and no atrophy.  MOTOR: There is no pronator drift of out-stretched arms. Muscle bulk and tone are normal. Muscle strength is normal.  REFLEXES: Reflexes are 2+ and symmetric at the biceps, triceps, knees, and ankles. Plantar responses are flexor.  SENSORY: Intact to light touch, pinprick, positional sensation and vibratory sensation are intact in fingers and toes.  COORDINATION: Rapid alternating movements and fine finger movements are intact. There is no dysmetria on finger-to-nose and heel-knee-shin.    GAIT/STANCE: She rely on her cane, cautious, Romberg is absent.   DIAGNOSTIC DATA (LABS, IMAGING, TESTING) - I reviewed patient records,  labs, notes, testing and imaging myself where available.   ASSESSMENT AND PLAN  Sherita Offenberger is a 31 y.o. female   Complicated migraine headaches  Keep preventive medications Topamax 100 mg twice a day  Imitrex 50 mg as needed Recurrent spells of dizziness, whole body tremor without loss of consciousness  EEG to rule out abnormal discharge,  Previous normal CT head without contrast in 2013  Marcial Pacas, M.D. Ph.D.  [facility] [address], [place], [place] [address] Ph: [phone] Fax: [phone]  CC: Celene Squibb, MD

## 2017-07-19 ENCOUNTER — Ambulatory Visit (INDEPENDENT_AMBULATORY_CARE_PROVIDER_SITE_OTHER): Payer: BLUE CROSS/BLUE SHIELD | Admitting: Neurology

## 2017-07-19 DIAGNOSIS — G43109 Migraine with aura, not intractable, without status migrainosus: Secondary | ICD-10-CM

## 2017-07-19 DIAGNOSIS — R42 Dizziness and giddiness: Secondary | ICD-10-CM

## 2017-07-19 DIAGNOSIS — R2990 Unspecified symptoms and signs involving the nervous system: Secondary | ICD-10-CM

## 2017-07-20 NOTE — Procedures (Signed)
    History:  Rosselyn Lalli is a 31 year old patient with a history of migraine headaches.  The patient has recurrent dizzy episodes and lightheadedness.  No loss of consciousness is as noted.  The patient is being evaluated for these events.  This is a routine EEG.  No skull defects are noted.  Medications include albuterol, Zyrtec, Advil, birth control pills, Sudafed, Imitrex, and Topamax.  EEG classification: Normal awake and drowsy  Description of the recording: The background rhythms of this recording consists of a fairly well modulated medium amplitude alpha rhythm of 11 Hz that is reactive to eye opening and closure. As the record progresses, the patient appears to remain in the waking state throughout the recording. Photic stimulation was performed, resulting in a bilateral and symmetric photic driving response. Hyperventilation was not performed. Toward the end of the recording, the patient enters the drowsy state with slight symmetric slowing seen. The patient never enters stage II sleep. At no time during the recording does there appear to be evidence of spike or spike wave discharges or evidence of focal slowing. EKG monitor shows no evidence of cardiac rhythm abnormalities with a heart rate of 60.  Impression: This is a normal EEG recording in the waking and drowsy state. No evidence of ictal or interictal discharges are seen.

## 2017-07-25 ENCOUNTER — Telehealth: Payer: Self-pay | Admitting: *Deleted

## 2017-07-25 NOTE — Telephone Encounter (Signed)
Left patient a detailed message, with results, on voicemail (ok per DPR).  Provided our number to call back with any questions.  

## 2017-07-25 NOTE — Progress Notes (Signed)
Left patient a detailed message, with results, on voicemail (ok per DPR).  Provided our number to call back with any questions.  

## 2017-11-01 ENCOUNTER — Ambulatory Visit (INDEPENDENT_AMBULATORY_CARE_PROVIDER_SITE_OTHER): Payer: BLUE CROSS/BLUE SHIELD | Admitting: Advanced Practice Midwife

## 2017-11-01 ENCOUNTER — Other Ambulatory Visit: Payer: Self-pay

## 2017-11-01 ENCOUNTER — Encounter: Payer: Self-pay | Admitting: Advanced Practice Midwife

## 2017-11-01 VITALS — BP 128/80 | HR 92 | Ht 65.0 in | Wt 305.0 lb

## 2017-11-01 DIAGNOSIS — Z3041 Encounter for surveillance of contraceptive pills: Secondary | ICD-10-CM

## 2017-11-01 NOTE — Progress Notes (Signed)
[facility] Visit  Patient name: Shannon Brooks MRN [id number]  Date of birth: 15-Sep-1985  CC & HPI:  Shannon Brooks is a 32 y.o. Caucasian female presenting today for med check  On POP's (migraine w/aura) for 3 months.  Has a 3 day bleeding episode at the same time every month!  Is OK with that, would prefer not to bleed, but will give the pill a little more time. Remembers to take @ same time w/o difficulty.   Pertinent History Reviewed:  Medical & Surgical Hx:   Past Medical History:  Diagnosis Date  . Asthma   . Broken toe   . Carpal tunnel syndrome   . Migraine   . Near syncope Episodic near-syncope  . Obesity, unspecified    Obesity  . Vertigo    Past Surgical History:  Procedure Laterality Date  . TONSILLECTOMY     Family History  Problem Relation Age of Onset  . Deafness Mother     Current Outpatient Medications:  .  albuterol (PROVENTIL HFA;VENTOLIN HFA) 108 (90 BASE) MCG/ACT inhaler, Inhale 2 puffs into the lungs every 6 (six) hours as needed for wheezing or shortness of breath. , Disp: , Rfl:  .  albuterol (PROVENTIL) (5 MG/ML) 0.5% nebulizer solution, Take 0.5 mLs (2.5 mg total) by nebulization every 6 (six) hours as needed for wheezing or shortness of breath., Disp: 20 mL, Rfl: 12 .  cetirizine (ZYRTEC) 10 MG chewable tablet, Chew 10 mg by mouth daily., Disp: , Rfl:  .  ibuprofen (ADVIL,MOTRIN) 800 MG tablet, Take 800 mg by mouth every 8 (eight) hours as needed., Disp: , Rfl:  .  magnesium oxide (MAG-OX) 400 MG tablet, Take 400 mg by mouth 2 (two) times daily., Disp: , Rfl:  .  norethindrone (ORTHO MICRONOR) 0.35 MG tablet, Take 1 tablet (0.35 mg total) by mouth daily., Disp: 1 Package, Rfl: 11 .  Potassium 99 MG TABS, Take by mouth., Disp: , Rfl:  .  Riboflavin (VITAMIN B2 PO), Take by mouth., Disp: , Rfl:  .  SUMAtriptan (IMITREX) 50 MG tablet, Take 1 tablet (50 mg total) by mouth every 2 (two) hours as needed for migraine. May repeat in 2 hours if  headache persists or recurs., Disp: 12 tablet, Rfl: 6 .  topiramate (TOPAMAX) 100 MG tablet, Take 1 tablet (100 mg total) by mouth 2 (two) times daily., Disp: 180 tablet, Rfl: 4 Social History: Reviewed -  reports that she has never smoked. She has never used smokeless tobacco.  Review of Systems:   Constitutional: Negative for fever and chills Eyes: Negative for visual disturbances Respiratory: Negative for shortness of breath, dyspnea Cardiovascular: Negative for chest pain or palpitations  Gastrointestinal: Negative for vomiting, diarrhea and constipation; no abdominal pain Genitourinary: Negative for dysuria and urgency, vaginal irritation or itching Musculoskeletal: Negative for back pain, joint pain, myalgias  Neurological: Negative for dizziness and headaches    Objective Findings:    Physical Examination: Vitals:   11/01/17 1336  BP: 128/80  Pulse: 92   General appearance - well appearing, and in no distress Mental status - alert, oriented to person, place, and time Chest:  Normal respiratory effort Heart - normal rate and regular rhythm Pelvic: deferred Musculoskeletal:  Normal range of motion without pain Extremities:  No edema    No results found for this or any previous visit (from the past 24 hour(s)).    Assessment & Plan:  A:   Contraception maintenance, POPs P:  Continue present  mgt   Return for If you have any problems.  Christin Fudge CNM 11/03/2017 12:47 AM

## 2017-11-03 DIAGNOSIS — Z3041 Encounter for surveillance of contraceptive pills: Secondary | ICD-10-CM | POA: Insufficient documentation

## 2017-11-30 DIAGNOSIS — E6609 Other obesity due to excess calories: Secondary | ICD-10-CM | POA: Diagnosis not present

## 2017-11-30 DIAGNOSIS — G43111 Migraine with aura, intractable, with status migrainosus: Secondary | ICD-10-CM | POA: Diagnosis not present

## 2017-11-30 DIAGNOSIS — Z9181 History of falling: Secondary | ICD-10-CM | POA: Diagnosis not present

## 2017-11-30 DIAGNOSIS — F411 Generalized anxiety disorder: Secondary | ICD-10-CM | POA: Diagnosis not present

## 2017-11-30 DIAGNOSIS — E782 Mixed hyperlipidemia: Secondary | ICD-10-CM | POA: Diagnosis not present

## 2017-11-30 DIAGNOSIS — J4541 Moderate persistent asthma with (acute) exacerbation: Secondary | ICD-10-CM | POA: Diagnosis not present

## 2017-12-07 DIAGNOSIS — R202 Paresthesia of skin: Secondary | ICD-10-CM | POA: Diagnosis not present

## 2017-12-07 DIAGNOSIS — Z9181 History of falling: Secondary | ICD-10-CM | POA: Diagnosis not present

## 2017-12-07 DIAGNOSIS — Z713 Dietary counseling and surveillance: Secondary | ICD-10-CM | POA: Diagnosis not present

## 2017-12-07 DIAGNOSIS — J4541 Moderate persistent asthma with (acute) exacerbation: Secondary | ICD-10-CM | POA: Diagnosis not present

## 2017-12-07 DIAGNOSIS — E6609 Other obesity due to excess calories: Secondary | ICD-10-CM | POA: Diagnosis not present

## 2017-12-07 DIAGNOSIS — F411 Generalized anxiety disorder: Secondary | ICD-10-CM | POA: Diagnosis not present

## 2017-12-07 DIAGNOSIS — G43119 Migraine with aura, intractable, without status migrainosus: Secondary | ICD-10-CM | POA: Diagnosis not present

## 2018-02-14 DIAGNOSIS — Z0271 Encounter for disability determination: Secondary | ICD-10-CM

## 2018-06-26 ENCOUNTER — Other Ambulatory Visit: Payer: Self-pay | Admitting: Advanced Practice Midwife

## 2018-07-18 ENCOUNTER — Other Ambulatory Visit: Payer: Self-pay | Admitting: Neurology

## 2018-08-27 DIAGNOSIS — F411 Generalized anxiety disorder: Secondary | ICD-10-CM | POA: Diagnosis not present

## 2018-08-27 DIAGNOSIS — E6609 Other obesity due to excess calories: Secondary | ICD-10-CM | POA: Diagnosis not present

## 2018-08-27 DIAGNOSIS — Z9181 History of falling: Secondary | ICD-10-CM | POA: Diagnosis not present

## 2018-08-27 DIAGNOSIS — J4541 Moderate persistent asthma with (acute) exacerbation: Secondary | ICD-10-CM | POA: Diagnosis not present

## 2018-08-30 DIAGNOSIS — G43119 Migraine with aura, intractable, without status migrainosus: Secondary | ICD-10-CM | POA: Diagnosis not present

## 2018-08-30 DIAGNOSIS — E782 Mixed hyperlipidemia: Secondary | ICD-10-CM | POA: Diagnosis not present

## 2018-08-30 DIAGNOSIS — Z0000 Encounter for general adult medical examination without abnormal findings: Secondary | ICD-10-CM | POA: Diagnosis not present

## 2018-09-05 ENCOUNTER — Inpatient Hospital Stay (HOSPITAL_COMMUNITY): Payer: BLUE CROSS/BLUE SHIELD | Attending: Hematology | Admitting: Hematology

## 2018-09-05 ENCOUNTER — Other Ambulatory Visit: Payer: Self-pay

## 2018-09-05 ENCOUNTER — Encounter (HOSPITAL_COMMUNITY): Payer: Self-pay | Admitting: Hematology

## 2018-09-05 ENCOUNTER — Inpatient Hospital Stay (HOSPITAL_COMMUNITY): Payer: BLUE CROSS/BLUE SHIELD

## 2018-09-05 DIAGNOSIS — D72829 Elevated white blood cell count, unspecified: Secondary | ICD-10-CM | POA: Diagnosis not present

## 2018-09-05 DIAGNOSIS — D72825 Bandemia: Secondary | ICD-10-CM

## 2018-09-05 DIAGNOSIS — D473 Essential (hemorrhagic) thrombocythemia: Secondary | ICD-10-CM | POA: Diagnosis not present

## 2018-09-05 DIAGNOSIS — D75839 Thrombocytosis, unspecified: Secondary | ICD-10-CM

## 2018-09-05 LAB — CBC WITH DIFFERENTIAL/PLATELET
BASOS PCT: 1 %
Band Neutrophils: 0 %
Basophils Absolute: 0.1 10*3/uL (ref 0.0–0.1)
Blasts: 0 %
Eosinophils Absolute: 0.1 10*3/uL (ref 0.0–0.5)
Eosinophils Relative: 1 %
HCT: 40.6 % (ref 36.0–46.0)
Hemoglobin: 12.2 g/dL (ref 12.0–15.0)
Lymphocytes Relative: 25 %
Lymphs Abs: 3.2 10*3/uL (ref 0.7–4.0)
MCH: 26.3 pg (ref 26.0–34.0)
MCHC: 30 g/dL (ref 30.0–36.0)
MCV: 87.5 fL (ref 80.0–100.0)
MONO ABS: 0.9 10*3/uL (ref 0.1–1.0)
Metamyelocytes Relative: 0 %
Monocytes Relative: 7 %
Myelocytes: 0 %
Neutro Abs: 8.6 10*3/uL — ABNORMAL HIGH (ref 1.7–7.7)
Neutrophils Relative %: 66 %
Other: 0 %
Platelets: 491 10*3/uL — ABNORMAL HIGH (ref 150–400)
Promyelocytes Relative: 0 %
RBC: 4.64 MIL/uL (ref 3.87–5.11)
RDW: 15.1 % (ref 11.5–15.5)
WBC: 12.9 10*3/uL — ABNORMAL HIGH (ref 4.0–10.5)
nRBC: 0 % (ref 0.0–0.2)
nRBC: 0 /100 WBC

## 2018-09-05 LAB — COMPREHENSIVE METABOLIC PANEL
ALBUMIN: 3.9 g/dL (ref 3.5–5.0)
ALT: 47 U/L — ABNORMAL HIGH (ref 0–44)
AST: 32 U/L (ref 15–41)
Alkaline Phosphatase: 77 U/L (ref 38–126)
Anion gap: 8 (ref 5–15)
BUN: 13 mg/dL (ref 6–20)
CHLORIDE: 107 mmol/L (ref 98–111)
CO2: 19 mmol/L — ABNORMAL LOW (ref 22–32)
Calcium: 9 mg/dL (ref 8.9–10.3)
Creatinine, Ser: 0.76 mg/dL (ref 0.44–1.00)
GFR calc Af Amer: >60 mL/min (ref >60)
GFR calc non Af Amer: >60 mL/min (ref >60)
Glucose, Bld: 88 mg/dL (ref 70–99)
Potassium: 3.7 mmol/L (ref 3.5–5.1)
Sodium: 134 mmol/L — ABNORMAL LOW (ref 135–145)
Total Bilirubin: 0.5 mg/dL (ref 0.3–1.2)
Total Protein: 7.4 g/dL (ref 6.5–8.1)

## 2018-09-05 LAB — RETICULOCYTES
IMMATURE RETIC FRACT: 9.8 % (ref 2.3–15.9)
RBC.: 4.64 MIL/uL (ref 3.87–5.11)
RETIC COUNT ABSOLUTE: 91.9 10*3/uL (ref 19.0–186.0)
Retic Ct Pct: 2 % (ref 0.4–3.1)

## 2018-09-05 LAB — VITAMIN B12: Vitamin B-12: 446 pg/mL (ref 180–914)

## 2018-09-05 LAB — MAGNESIUM: Magnesium: 2.1 mg/dL (ref 1.7–2.4)

## 2018-09-05 LAB — IRON AND TIBC
Iron: 57 ug/dL (ref 28–170)
Saturation Ratios: 16 % (ref 10.4–31.8)
TIBC: 346 ug/dL (ref 250–450)
UIBC: 289 ug/dL

## 2018-09-05 LAB — FOLATE: Folate: 20.9 ng/mL (ref >5.9)

## 2018-09-05 LAB — LACTATE DEHYDROGENASE: LDH: 170 U/L (ref 98–192)

## 2018-09-05 LAB — SAVE SMEAR(SSMR), FOR PROVIDER SLIDE REVIEW

## 2018-09-05 LAB — FERRITIN: FERRITIN: 33 ng/mL (ref 11–307)

## 2018-09-05 NOTE — Patient Instructions (Signed)
[facility] Discharge Instructions  Follow up in 4 weeks    Thank you for choosing [facility] at [facility] to provide your oncology and hematology care.  To afford each patient quality time with our provider, please arrive at least 15 minutes before your scheduled appointment time.   If you have a lab appointment with the [facility] please come in thru the  Main Entrance and check in at the main information desk  You need to re-schedule your appointment should you arrive 10 or more minutes late.  We strive to give you quality time with our providers, and arriving late affects you and other patients whose appointments are after yours.  Also, if you no show three or more times for appointments you may be dismissed from the clinic at the providers discretion.     Again, thank you for choosing [facility].  Our hope is that these requests will decrease the amount of time that you wait before being seen by our physicians.       _____________________________________________________________  Should you have questions after your visit to [facility], please contact our office at [phone] between the hours of 8:00 a.m. and 4:30 p.m.  Voicemails left after 4:00 p.m. will not be returned until the following business day.  For prescription refill requests, have your pharmacy contact our office and allow 72 hours.    Cancer Center Support Programs:   > Cancer Support Group  2nd Tuesday of the month 1pm-2pm, Journey Room

## 2018-09-05 NOTE — Progress Notes (Signed)
CONSULT NOTE  Patient Care Team: Celene Squibb, MD as PCP - General (Internal Medicine)  CHIEF COMPLAINTS/PURPOSE OF CONSULTATION: Increase WBC and increased platelets.   HISTORY OF PRESENTING ILLNESS:  Shannon Brooks 33 y.o. female is here because of an elevated WBC and platelets. Her counts have been elevated on several different occasions so she was referred for a further work up. She reports she has felt well no problems or recent infections. She has problems with her balance due to her new diagnosis of vertigo and vestibular migraines. She walks with a cane for stability. She denies any new medication startes or steroids other than her inhaler occasionally. She denies any fevers, chills, night sweats, or unexplained weight loss. She denies ever smoking, alcohol, or illegal drugs. Denies any nausea, vomiting, or diarrhea. Denies any new pains. Had not noticed any recent bleeding such as epistaxis, hematuria or hematochezia. Denies recent chest pain on exertion, shortness of breath on minimal exertion, pre-syncopal episodes, or palpitations. Denies any numbness or tingling in hands or feet. Denies any recent fevers, infections, or recent hospitalizations. Patient reports appetite at 75% and energy level at 25%. She is married but mainly lives alone due to her husband is a Administrator. She is able to take care of herself. She performs all her own ADLs and activities but reports she rarely leaves her house. She lives right beside her parents house if she needs anything. She does not work she is currently trying to get disability for her vestibular margarines.  She had no prior history or diagnosis of cancer. Her age appropriate screening programs are up-to-date. She reports her maternal aunt was diagnosed with lupus. Her maternal grandmother had brain cancer.   MEDICAL HISTORY:  Past Medical History:  Diagnosis Date  . Asthma   . Broken toe   . Carpal tunnel syndrome   . Migraine   . Near  syncope Episodic near-syncope  . Obesity, unspecified    Obesity  . Vertigo     SURGICAL HISTORY: Past Surgical History:  Procedure Laterality Date  . TONSILLECTOMY    . WISDOM TOOTH EXTRACTION Bilateral     SOCIAL HISTORY: Social History   Socioeconomic History  . Marital status: Married    Spouse name: Not on file  . Number of children: 0  . Years of education: some college  . Highest education level: Not on file  Occupational History  . Occupation: Unemployed  Social Needs  . Financial resource strain: Not on file  . Food insecurity:    Worry: Not on file    Inability: Not on file  . Transportation needs:    Medical: Not on file    Non-medical: Not on file  Tobacco Use  . Smoking status: Never Smoker  . Smokeless tobacco: Never Used  Substance and Sexual Activity  . Alcohol use: No    Frequency: Never  . Drug use: No  . Sexual activity: Yes    Birth control/protection: Pill  Lifestyle  . Physical activity:    Days per week: Not on file    Minutes per session: Not on file  . Stress: Not on file  Relationships  . Social connections:    Talks on phone: Not on file    Gets together: Not on file    Attends religious service: Not on file    Active member of club or organization: Not on file    Attends meetings of clubs or organizations: Not on file  Relationship status: Not on file  . Intimate partner violence:    Fear of current or ex partner: Not on file    Emotionally abused: Not on file    Physically abused: Not on file    Forced sexual activity: Not on file  Other Topics Concern  . Not on file  Social History Narrative   Lives at home with spouse.   Right-handed.    Occasional caffeine use.    FAMILY HISTORY: Family History  Problem Relation Age of Onset  . Deafness Mother   . Diabetes Mother   . Diabetes Maternal Grandmother   . Brain cancer Maternal Grandmother   . Diabetes Maternal Grandfather   . Multiple sclerosis Other   . Lupus  Other     ALLERGIES:  is allergic to penicillins and desipramine.  MEDICATIONS:  Current Outpatient Medications  Medication Sig Dispense Refill  . albuterol (PROVENTIL HFA;VENTOLIN HFA) 108 (90 BASE) MCG/ACT inhaler Inhale 2 puffs into the lungs every 6 (six) hours as needed for wheezing or shortness of breath.     . cetirizine (ZYRTEC) 10 MG chewable tablet Chew 10 mg by mouth daily.    Marland Kitchen guaiFENesin (MUCINEX) 600 MG 12 hr tablet Take 600 mg by mouth 2 (two) times daily.    Marland Kitchen ibuprofen (ADVIL,MOTRIN) 800 MG tablet Take 800 mg by mouth every 8 (eight) hours as needed.    . JENCYCLA 0.35 MG tablet TAKE 1 TABLET BY MOUTH ONCE DAILY 28 tablet 11  . magnesium oxide (MAG-OX) 400 MG tablet Take 400 mg by mouth 2 (two) times daily.    . Potassium 99 MG TABS Take by mouth.    . Riboflavin (VITAMIN B2 PO) Take by mouth.    . SUMAtriptan (IMITREX) 50 MG tablet Take 1 tablet (50 mg total) by mouth every 2 (two) hours as needed for migraine. May repeat in 2 hours if headache persists or recurs. 12 tablet 6  . topiramate (TOPAMAX) 100 MG tablet Take 1 tablet (100 mg total) by mouth 2 (two) times daily. 180 tablet 4  . albuterol (PROVENTIL) (5 MG/ML) 0.5% nebulizer solution Take 0.5 mLs (2.5 mg total) by nebulization every 6 (six) hours as needed for wheezing or shortness of breath. (Patient not taking: Reported on 09/05/2018) 20 mL 12   No current facility-administered medications for this visit.     REVIEW OF SYSTEMS:   Constitutional: Denies fevers, chills or abnormal night sweats Eyes: Denies blurriness of vision, double vision or watery eyes Ears, nose, mouth, throat, and face: Denies mucositis or sore throat Respiratory: Denies cough, dyspnea or wheezes Cardiovascular: Denies palpitation, chest discomfort or lower extremity swelling Gastrointestinal:  Denies nausea, heartburn or change in bowel habits Skin: Denies abnormal skin rashes Lymphatics: Denies new lymphadenopathy or easy  bruising Neurological:Denies numbness, tingling or new weaknesses Behavioral/Psych: Mood is stable, no new changes  All other systems were reviewed with the patient and are negative.  PHYSICAL EXAMINATION: ECOG PERFORMANCE STATUS: 0 - Asymptomatic  Vitals:   09/05/18 1357  BP: 140/67  Pulse: 68  Resp: 16  Temp: 98.2 F (36.8 C)  SpO2: 100%   Filed Weights   09/05/18 1357  Weight: (!) 313 lb (142 kg)    GENERAL:alert, no distress and comfortable SKIN: skin color, texture, turgor are normal, no rashes or significant lesions EYES: normal, conjunctiva are pink and non-injected, sclera clear OROPHARYNX:no exudate, no erythema and lips, buccal mucosa, and tongue normal  NECK: supple, thyroid normal size, non-tender, without nodularity  LYMPH:  no palpable lymphadenopathy in the cervical, axillary or inguinal LUNGS: clear to auscultation and percussion with normal breathing effort HEART: regular rate & rhythm and no murmurs and no lower extremity edema ABDOMEN:abdomen soft, non-tender and normal bowel sounds Musculoskeletal:no cyanosis of digits and no clubbing  PSYCH: alert & oriented x 3 with fluent speech NEURO: no focal motor/sensory deficits  LABORATORY DATA:  I have reviewed the data as listed from Dr. Juel Burrow office.  RADIOGRAPHIC STUDIES: I have personally reviewed the radiological images as listed and agreed with the findings in the report.  I have reviewed Francene Finders, NP's note and agree with the documentation.  I personally performed a face-to-face visit, made revisions and my assessment and plan is as follows.  ASSESSMENT & PLAN:  Leukocytosis 1.  Leukocytosis and thrombocytosis: - Recent CBC at Dr. Juel Burrow office on 08/27/2018 shows elevated white count of 14, platelet count of 515.  Hemoglobin was normal at 13.5.  Differential showed increased absolute neutrophil count and slight monocytosis. - I have reviewed her CBC on EPIC from 04/30/2012 which showed increased  white count of 11.9 and platelet count of 438. -I have reviewed her medication list.  Topamax can rarely cause thrombocytosis.  She is not on any medications like steroids and lithium which can increase white count.  Denies any surgical spleen removal.  Patient is never smoker. -We will will repeat her CBC with differential, and review her smear today.  We will also check her LDH, reticulocyte count, and evaluate her for myeloproliferative disorders.  We will send BCR/ABL by FISH, Jak 2 [id number] mutation testing with reflex testing to other mutations.  We will also check for ferritin, iron panel to see if it is causing reactive thrombocytosis.  We will check connective tissue disorders work-up including ANA and rheumatoid factor. -We will see her back in 3 to 4 weeks for follow-up to discuss the results.     All questions were answered. The patient knows to call the clinic with any problems, questions or concerns.     Derek Jack, MD 09/05/18 4:14 PM

## 2018-09-05 NOTE — Assessment & Plan Note (Addendum)
1.  Leukocytosis and thrombocytosis: - Recent CBC at Dr. Juel Burrow office on 08/27/2018 shows elevated white count of 14, platelet count of 515.  Hemoglobin was normal at 13.5.  Differential showed increased absolute neutrophil count and slight monocytosis. - I have reviewed her CBC on EPIC from 04/30/2012 which showed increased white count of 11.9 and platelet count of 438. -I have reviewed her medication list.  Topamax can rarely cause thrombocytosis.  She is not on any medications like steroids and lithium which can increase white count.  Denies any surgical spleen removal.  Patient is never smoker. -We will will repeat her CBC with differential, and review her smear today.  We will also check her LDH, reticulocyte count, and evaluate her for myeloproliferative disorders.  We will send BCR/ABL by FISH, Jak 2 V617F mutation testing with reflex testing to other mutations.  We will also check for ferritin, iron panel to see if it is causing reactive thrombocytosis.  We will check connective tissue disorders work-up including ANA and rheumatoid factor. -We will see her back in 3 to 4 weeks for follow-up to discuss the results.

## 2018-09-06 LAB — FANA STAINING PATTERNS
Homogeneous Pattern: 1:160 {titer} — ABNORMAL HIGH
Speckled Pattern: 1:160 {titer} — ABNORMAL HIGH

## 2018-09-06 LAB — VITAMIN D 25 HYDROXY (VIT D DEFICIENCY, FRACTURES): Vit D, 25-Hydroxy: 11.1 ng/mL — ABNORMAL LOW (ref 30.0–100.0)

## 2018-09-06 LAB — RHEUMATOID FACTOR: Rheumatoid fact SerPl-aCnc: <10 IU/mL (ref 0.0–13.9)

## 2018-09-06 LAB — ANTINUCLEAR ANTIBODIES, IFA: ANA Ab, IFA: POSITIVE — AB

## 2018-09-12 LAB — CALR + JAK2 E12-15 + MPL (REFLEXED)

## 2018-09-12 LAB — JAK2 V617F, W REFLEX TO CALR/E12/MPL

## 2018-09-17 LAB — BCR-ABL1 FISH
CELLS ANALYZED: 200
Cells Counted: 200

## 2018-09-30 ENCOUNTER — Encounter: Payer: Self-pay | Admitting: Neurology

## 2018-09-30 ENCOUNTER — Ambulatory Visit: Payer: BLUE CROSS/BLUE SHIELD | Admitting: Neurology

## 2018-09-30 VITALS — BP 130/84 | HR 79 | Ht 65.0 in | Wt 313.0 lb

## 2018-09-30 DIAGNOSIS — R42 Dizziness and giddiness: Secondary | ICD-10-CM | POA: Insufficient documentation

## 2018-09-30 DIAGNOSIS — G43109 Migraine with aura, not intractable, without status migrainosus: Secondary | ICD-10-CM

## 2018-09-30 MED ORDER — SUMATRIPTAN SUCCINATE 50 MG PO TABS: 50.0000 mg | ORAL_TABLET | ORAL | 11 refills | Status: DC | PRN

## 2018-09-30 MED ORDER — TOPIRAMATE 100 MG PO TABS: 100.0000 mg | ORAL_TABLET | Freq: Two times a day (BID) | ORAL | 4 refills | Status: DC

## 2018-09-30 NOTE — Progress Notes (Signed)
PATIENT: Shannon Brooks DOB: [birth year]  Chief Complaint  Patient presents with  . Migraine    She is here for her yearly follow up for migraines.  She estimates having one migraine per week.  She uses Imitrex, on a limited basis, which helps her pain.     HISTORICAL  Shannon Brooks is a 33 years old female, seen in refer by his primary care doctor Shannon Brooks for evaluation of dizziness, initial evaluation was on April 10 2017.  I reviewed and summarized the referring note, she had a past medical history of chronic migraine, obese, presented with frequent vertigo with associated headache since 2017   She had migraine headaches since 33 years old, her typical migraine are lateralized severe pounding headache with associated light noise sensitivity, nauseous, lasting for a few hours, was helped by caffeine, over-the-counter Excedrin Migraine, Tylenol ibuprofen as needed, sometimes her migraines are preceded by auras, visual distortion, dizziness,  She also complains of intermittent vertigo episodes since 2013, gradually getting worse especially since 2017, over the past few months, she has it almost on a weekly basis, sudden onset of unsteady gait, difficulty focusing, brain foggy sensation, lasting for few hours, caffeine usually helps her symptoms temporarily, oftentimes is followed by severe migraine headaches,  Stopping birth control in March 2018 seems to make her symptoms worse   Update July 10, 2017: She is now taking Topamax 100 mg twice a day, doing very well, lost 11 pounds over past 3 months, Imitrex as needed works well for her migraine headaches, and recurrent dizzy spells, continue have recurrent spells of lightheadedness, whole body tremor, without loss consciousness lasting for a few minutes, not necessarily associated with headaches,  UPDATE September 30 2018: She is over all happy with current migraine control, she is taking Topamax 200mg  daily, magnesium oxide,  riboflavin 100mg  twice a day as preventive medications.    She has migraine once a week,  Sometimes precede by dizziness, sleeping would help.    Imitrex 50mg  as needed was helpful.  She also came with a list of constellation of complaints, frequent body tremor, disturbance spells, staring at the computer screen,Watching TV, increased brain fog  EEG was normal in Dec 2018   REVIEW OF SYSTEMS: Full 14 system review of systems performed and notable only for as above  ALLERGIES: Allergies  Allergen Reactions  . Penicillins   . Desipramine Rash    HOME MEDICATIONS: Current Outpatient Medications  Medication Sig Dispense Refill  . albuterol (PROVENTIL HFA;VENTOLIN HFA) 108 (90 BASE) MCG/ACT inhaler Inhale 2 puffs into the lungs every 6 (six) hours as needed for wheezing or shortness of breath.     Marland Kitchen albuterol (PROVENTIL) (5 MG/ML) 0.5% nebulizer solution Take 0.5 mLs (2.5 mg total) by nebulization every 6 (six) hours as needed for wheezing or shortness of breath. 20 mL 12  . cetirizine (ZYRTEC) 10 MG chewable tablet Chew 10 mg by mouth daily.    Marland Kitchen guaiFENesin (MUCINEX) 600 MG 12 hr tablet Take 600 mg by mouth 2 (two) times daily.    Marland Kitchen ibuprofen (ADVIL,MOTRIN) 800 MG tablet Take 800 mg by mouth every 8 (eight) hours as needed.    . JENCYCLA 0.35 MG tablet TAKE 1 TABLET BY MOUTH ONCE DAILY 28 tablet 11  . magnesium oxide (MAG-OX) 400 MG tablet Take 400 mg by mouth 2 (two) times daily.    . Potassium 99 MG TABS Take by mouth.    . Riboflavin (VITAMIN B2 PO) Take  by mouth.    . SUMAtriptan (IMITREX) 50 MG tablet Take 1 tablet (50 mg total) by mouth every 2 (two) hours as needed for migraine. May repeat in 2 hours if headache persists or recurs. 12 tablet 6  . topiramate (TOPAMAX) 100 MG tablet Take 1 tablet (100 mg total) by mouth 2 (two) times daily. 180 tablet 4   No current facility-administered medications for this visit.     PAST MEDICAL HISTORY: Past Medical History:  Diagnosis  Date  . Asthma   . Broken toe   . Carpal tunnel syndrome   . Migraine   . Near syncope Episodic near-syncope  . Obesity, unspecified    Obesity  . Vertigo     PAST SURGICAL HISTORY: Past Surgical History:  Procedure Laterality Date  . TONSILLECTOMY    . WISDOM TOOTH EXTRACTION Bilateral     FAMILY HISTORY: Family History  Problem Relation Age of Onset  . Deafness Mother   . Diabetes Mother   . Diabetes Maternal Grandmother   . Brain cancer Maternal Grandmother   . Diabetes Maternal Grandfather   . Multiple sclerosis Other   . Lupus Other     SOCIAL HISTORY:  Social History   Socioeconomic History  . Marital status: Married    Spouse name: Not on file  . Number of children: 0  . Years of education: some college  . Highest education level: Not on file  Occupational History  . Occupation: Unemployed  Social Needs  . Financial resource strain: Not on file  . Food insecurity:    Worry: Not on file    Inability: Not on file  . Transportation needs:    Medical: Not on file    Non-medical: Not on file  Tobacco Use  . Smoking status: Never Smoker  . Smokeless tobacco: Never Used  Substance and Sexual Activity  . Alcohol use: No    Frequency: Never  . Drug use: No  . Sexual activity: Yes    Birth control/protection: Pill  Lifestyle  . Physical activity:    Days per week: Not on file    Minutes per session: Not on file  . Stress: Not on file  Relationships  . Social connections:    Talks on phone: Not on file    Gets together: Not on file    Attends religious service: Not on file    Active member of club or organization: Not on file    Attends meetings of clubs or organizations: Not on file    Relationship status: Not on file  . Intimate partner violence:    Fear of current or ex partner: Not on file    Emotionally abused: Not on file    Physically abused: Not on file    Forced sexual activity: Not on file  Other Topics Concern  . Not on file    Social History Narrative   Lives at home with spouse.   Right-handed.    Occasional caffeine use.     PHYSICAL EXAM   Vitals:   09/30/18 1137  BP: 130/84  Pulse: 79  Weight: (!) 313 lb (142 kg)  Height: 5\' 5"  (1.651 m)    Not recorded      Body mass index is 52.09 kg/m.  PHYSICAL EXAMNIATION:  Gen: NAD, conversant, well nourised, obese, well groomed                     Cardiovascular: Regular rate rhythm, no peripheral edema,  warm, nontender. Eyes: Conjunctivae clear without exudates or hemorrhage Neck: Supple, no carotid bruits. Pulmonary: Clear to auscultation bilaterally   NEUROLOGICAL EXAM:  MENTAL STATUS: Speech:    Speech is normal; fluent and spontaneous with normal comprehension.  Cognition:     Orientation to time, place and person     Normal recent and remote memory     Normal Attention span and concentration     Normal Language, naming, repeating,spontaneous speech     Fund of knowledge   CRANIAL NERVES: CN II: Visual fields are full to confrontation. Fundoscopic exam is normal with sharp discs and no vascular changes. Pupils are round equal and briskly reactive to light. CN III, IV, VI: extraocular movement are normal. No ptosis. CN V: Facial sensation is intact to pinprick in all 3 divisions bilaterally. Corneal responses are intact.  CN VII: Face is symmetric with normal eye closure and smile. CN VIII: Hearing is normal to rubbing fingers CN IX, X: Palate elevates symmetrically. Phonation is normal. CN XI: Head turning and shoulder shrug are intact CN XII: Tongue is midline with normal movements and no atrophy.  MOTOR: There is no pronator drift of out-stretched arms. Muscle bulk and tone are normal. Muscle strength is normal.  REFLEXES: Reflexes are 2+ and symmetric at the biceps, triceps, knees, and ankles. Plantar responses are flexor.  SENSORY: Intact to light touch, pinprick, positional sensation and vibratory sensation are intact in  fingers and toes.  COORDINATION: Rapid alternating movements and fine finger movements are intact. There is no dysmetria on finger-to-nose and heel-knee-shin.    GAIT/STANCE: She rely on her cane, cautious, Romberg is absent.   DIAGNOSTIC DATA (LABS, IMAGING, TESTING) - I reviewed patient records, labs, notes, testing and imaging myself where available.   ASSESSMENT AND PLAN  Shannon Brooks is a 33 y.o. female   Complicated migraine headaches  Keep preventive medications Topamax 200mg  daily  Imitrex 50 mg as needed     Marcial Pacas, M.D. Ph.D.  [facility] [address], [place], [place] [address] Ph: [phone] Fax: [phone]  CC: Shannon Squibb, MD

## 2018-10-07 ENCOUNTER — Encounter (HOSPITAL_COMMUNITY): Payer: Self-pay | Admitting: Hematology

## 2018-10-07 ENCOUNTER — Inpatient Hospital Stay (HOSPITAL_COMMUNITY): Payer: BLUE CROSS/BLUE SHIELD | Attending: Hematology | Admitting: Hematology

## 2018-10-07 ENCOUNTER — Other Ambulatory Visit: Payer: Self-pay

## 2018-10-07 VITALS — BP 144/77 | HR 85 | Temp 97.9°F | Resp 18 | Wt 309.0 lb

## 2018-10-07 DIAGNOSIS — E559 Vitamin D deficiency, unspecified: Secondary | ICD-10-CM | POA: Insufficient documentation

## 2018-10-07 DIAGNOSIS — D473 Essential (hemorrhagic) thrombocythemia: Secondary | ICD-10-CM

## 2018-10-07 DIAGNOSIS — D72829 Elevated white blood cell count, unspecified: Secondary | ICD-10-CM

## 2018-10-07 DIAGNOSIS — Z808 Family history of malignant neoplasm of other organs or systems: Secondary | ICD-10-CM | POA: Diagnosis not present

## 2018-10-07 DIAGNOSIS — D75839 Thrombocytosis, unspecified: Secondary | ICD-10-CM

## 2018-10-07 DIAGNOSIS — Z79899 Other long term (current) drug therapy: Secondary | ICD-10-CM | POA: Insufficient documentation

## 2018-10-07 MED ORDER — ERGOCALCIFEROL 1.25 MG (50000 UT) PO CAPS: 50000.0000 [IU] | ORAL_CAPSULE | ORAL | 1 refills | Status: DC

## 2018-10-07 NOTE — Assessment & Plan Note (Signed)
1.  Leukocytosis and thrombocytosis: - CBC at Dr. Juel Burrow office on 08/27/2018 shows elevated white count of 14, platelet count 515.  Hemoglobin was normal.  Differential showed increased neutrophil count and slight monocytosis. -Review of labs on EMR from 04/30/2012 shows increased white count of 11.9 platelet count of 438. -She is not on steroids or lithium.  Denies splenectomy.  She is a never smoker. -BCR/ABL by FISH was negative.  Jak 2 V6 77F with reflex mutation testing was negative.  Repeat CBC shows white count of 12.9.  Platelet count was 491. - ANA was mildly positive with a dilution of 1:160.  She does not have any symptoms of lupus. - The next best step to find out the etiology is doing a bone marrow biopsy.  Patient would like to wait at this time. - I will see her back in 3 months for follow-up.  I have suggested her to take iron tablet daily as her ferritin was borderline at 33. -I will repeat her CBC at next visit.  2.  Vitamin D deficiency: -Vitamin D is severely low at 11.1.  I will start her on vitamin D 50,000 units weekly.  I plan to repeat vitamin D level at next visit.

## 2018-10-07 NOTE — Progress Notes (Signed)
Shannon Brooks, [place] [address]   CLINIC:  Medical Oncology/Hematology  PCP:  Celene Squibb, MD [place] [address] [phone]   REASON FOR VISIT:  Follow-up for Increase WBC and increased platelets.      INTERVAL HISTORY:  Shannon Brooks 33 y.o. female returns for routine follow-up and consideration for next cycle of chemotherapy. She is here today by herself. She states that she has had a decrease in her appetite. Denies any nausea, vomiting, or diarrhea. Denies any new pains. Had not noticed any recent bleeding such as epistaxis, hematuria or hematochezia. Denies recent chest pain on exertion, shortness of breath on minimal exertion, pre-syncopal episodes, or palpitations. Denies any numbness or tingling in hands or feet. Denies any recent fevers, infections, or recent hospitalizations. Patient reports appetite at 100% and energy level at 100%.      REVIEW OF SYSTEMS:  Review of Systems  All other systems reviewed and are negative.    PAST MEDICAL/SURGICAL HISTORY:  Past Medical History:  Diagnosis Date  . Asthma   . Broken toe   . Carpal tunnel syndrome   . Migraine   . Near syncope Episodic near-syncope  . Obesity, unspecified    Obesity  . Vertigo    Past Surgical History:  Procedure Laterality Date  . TONSILLECTOMY    . WISDOM TOOTH EXTRACTION Bilateral      SOCIAL HISTORY:  Social History   Socioeconomic History  . Marital status: Married    Spouse name: Not on file  . Number of children: 0  . Years of education: some college  . Highest education level: Not on file  Occupational History  . Occupation: Unemployed  Social Needs  . Financial resource strain: Not on file  . Food insecurity:    Worry: Not on file    Inability: Not on file  . Transportation needs:    Medical: Not on file    Non-medical: Not on file  Tobacco Use  . Smoking status: Never Smoker  . Smokeless tobacco: Never  Used  Substance and Sexual Activity  . Alcohol use: No    Frequency: Never  . Drug use: No  . Sexual activity: Yes    Birth control/protection: Pill  Lifestyle  . Physical activity:    Days per week: Not on file    Minutes per session: Not on file  . Stress: Not on file  Relationships  . Social connections:    Talks on phone: Not on file    Gets together: Not on file    Attends religious service: Not on file    Active member of club or organization: Not on file    Attends meetings of clubs or organizations: Not on file    Relationship status: Not on file  . Intimate partner violence:    Fear of current or ex partner: Not on file    Emotionally abused: Not on file    Physically abused: Not on file    Forced sexual activity: Not on file  Other Topics Concern  . Not on file  Social History Narrative   Lives at home with spouse.   Right-handed.    Occasional caffeine use.    FAMILY HISTORY:  Family History  Problem Relation Age of Onset  . Deafness Mother   . Diabetes Mother   . Diabetes Maternal Grandmother   . Brain cancer Maternal Grandmother   . Diabetes Maternal  Grandfather   . Multiple sclerosis Other   . Lupus Other     CURRENT MEDICATIONS:  Outpatient Encounter Medications as of 10/07/2018  Medication Sig  . albuterol (PROVENTIL HFA;VENTOLIN HFA) 108 (90 BASE) MCG/ACT inhaler Inhale 2 puffs into the lungs every 6 (six) hours as needed for wheezing or shortness of breath.   Marland Kitchen albuterol (PROVENTIL) (5 MG/ML) 0.5% nebulizer solution Take 0.5 mLs (2.5 mg total) by nebulization every 6 (six) hours as needed for wheezing or shortness of breath.  . cetirizine (ZYRTEC) 10 MG chewable tablet Chew 10 mg by mouth daily.  Marland Kitchen guaiFENesin (MUCINEX) 600 MG 12 hr tablet Take 600 mg by mouth 2 (two) times daily.  Marland Kitchen ibuprofen (ADVIL,MOTRIN) 800 MG tablet Take 800 mg by mouth every 8 (eight) hours as needed.  . JENCYCLA 0.35 MG tablet TAKE 1 TABLET BY MOUTH ONCE DAILY  .  magnesium oxide (MAG-OX) 400 MG tablet Take 400 mg by mouth 2 (two) times daily.  . Potassium 99 MG TABS Take by mouth.  . Riboflavin (VITAMIN B2 PO) Take by mouth.  . SUMAtriptan (IMITREX) 50 MG tablet Take 1 tablet (50 mg total) by mouth every 2 (two) hours as needed for migraine. May repeat in 2 hours if headache persists or recurs.  . topiramate (TOPAMAX) 100 MG tablet Take 1 tablet (100 mg total) by mouth 2 (two) times daily.  . ergocalciferol (VITAMIN D2) 1.25 MG (50000 UT) capsule Take 1 capsule (50,000 Units total) by mouth once a week.   No facility-administered encounter medications on file as of 10/07/2018.     ALLERGIES:  Allergies  Allergen Reactions  . Penicillins   . Desipramine Rash     PHYSICAL EXAM:  ECOG Performance status: 1  Vitals:   10/07/18 1422  BP: (!) 144/77  Pulse: 85  Resp: 18  Temp: 97.9 F (36.6 C)  SpO2: 100%   Filed Weights   10/07/18 1422  Weight: (!) 309 lb (140.2 kg)    Physical Exam Constitutional:      Appearance: Normal appearance.  Cardiovascular:     Rate and Rhythm: Normal rate and regular rhythm.  Pulmonary:     Effort: Pulmonary effort is normal.     Breath sounds: Normal breath sounds.  Neurological:     General: No focal deficit present.     Mental Status: She is alert and oriented to person, place, and time.  Psychiatric:        Mood and Affect: Mood normal.        Behavior: Behavior normal.      LABORATORY DATA:  I have reviewed the labs as listed.  CBC    Component Value Date/Time   WBC 12.9 (H) 09/05/2018 1503   RBC 4.64 09/05/2018 1503   RBC 4.64 09/05/2018 1503   HGB 12.2 09/05/2018 1503   HCT 40.6 09/05/2018 1503   PLT 491 (H) 09/05/2018 1503   MCV 87.5 09/05/2018 1503   MCH 26.3 09/05/2018 1503   MCHC 30.0 09/05/2018 1503   RDW 15.1 09/05/2018 1503   LYMPHSABS 3.2 09/05/2018 1503   MONOABS 0.9 09/05/2018 1503   EOSABS 0.1 09/05/2018 1503   BASOSABS 0.1 09/05/2018 1503   CMP Latest Ref Rng &  Units 09/05/2018 04/30/2012  Glucose 70 - 99 mg/dL 88 89  BUN 6 - 20 mg/dL 13 13  Creatinine 0.44 - 1.00 mg/dL 0.76 0.62  Sodium 135 - 145 mmol/L 134(L) 136  Potassium 3.5 - 5.1 mmol/L 3.7 4.0  Chloride  98 - 111 mmol/L 107 101  CO2 22 - 32 mmol/L 19(L) 24  Calcium 8.9 - 10.3 mg/dL 9.0 9.6  Total Protein 6.5 - 8.1 g/dL 7.4 -  Total Bilirubin 0.3 - 1.2 mg/dL 0.5 -  Alkaline Phos 38 - 126 U/L 77 -  AST 15 - 41 U/L 32 -  ALT 0 - 44 U/L 47(H) -       DIAGNOSTIC IMAGING:  I have independently reviewed the scans and discussed with the patient.   I have reviewed Venita Lick LPN's note and agree with the documentation.  I personally performed a face-to-face visit, made revisions and my assessment and plan is as follows.    ASSESSMENT & PLAN:   Thrombocytosis ([place]) 1.  Leukocytosis and thrombocytosis: - CBC at Dr. Juel Burrow office on 08/27/2018 shows elevated white count of 14, platelet count 515.  Hemoglobin was normal.  Differential showed increased neutrophil count and slight monocytosis. -Review of labs on EMR from 04/30/2012 shows increased white count of 11.9 platelet count of 438. -She is not on steroids or lithium.  Denies splenectomy.  She is a never smoker. -BCR/ABL by FISH was negative.  Jak 2 V6 59F with reflex mutation testing was negative.  Repeat CBC shows white count of 12.9.  Platelet count was 491. - ANA was mildly positive with a dilution of 1:160.  She does not have any symptoms of lupus. - The next best step to find out the etiology is doing a bone marrow biopsy.  Patient would like to wait at this time. - I will see her back in 3 months for follow-up.  I have suggested her to take iron tablet daily as her ferritin was borderline at 33. -I will repeat her CBC at next visit.  2.  Vitamin D deficiency: -Vitamin D is severely low at 11.1.  I will start her on vitamin D 50,000 units weekly.  I plan to repeat vitamin D level at next visit.   Total time spent is 25 minutes  with more than 50% of the time spent face-to-face discussing blood test results, differential diagnosis, further treatment options and coordination of care.    Orders placed this encounter:  Orders Placed This Encounter  Procedures  . CBC with Differential/Platelet  . Iron and TIBC  . Ferritin  . Vitamin B12  . Folate  . Vitamin D 25 hydroxy      Derek Jack, MD [place] [phone]

## 2018-10-07 NOTE — Patient Instructions (Addendum)
[place] at [facility] Discharge Instructions  You were seen today by Dr. Delton Coombes. He went over your recent results. Please start taking an iron supplement once a day. He will send a prescription in for Vitamin D that you should take once a week. He would like for you to have a bone marrow biopsy to evaluate for any over production of cells. He would like you to think about getting this done.  He will see you back in 3 months for labs and follow up.   Thank you for choosing [place] at [facility] to provide your oncology and hematology care.  To afford each patient quality time with our provider, please arrive at least 15 minutes before your scheduled appointment time.   If you have a lab appointment with the [place] please come in thru the  Main Entrance and check in at the main information desk  You need to re-schedule your appointment should you arrive 10 or more minutes late.  We strive to give you quality time with our providers, and arriving late affects you and other patients whose appointments are after yours.  Also, if you no show three or more times for appointments you may be dismissed from the clinic at the providers discretion.     Again, thank you for choosing [facility].  Our hope is that these requests will decrease the amount of time that you wait before being seen by our physicians.       _____________________________________________________________  Should you have questions after your visit to [facility], please contact our office at [phone] between the hours of 8:00 a.m. and 4:30 p.m.  Voicemails left after 4:00 p.m. will not be returned until the following business day.  For prescription refill requests, have your pharmacy contact our office and allow 72 hours.    Cancer Center Support Programs:   > Cancer Support Group  2nd Tuesday of the month 1pm-2pm, Journey Room

## 2018-10-16 ENCOUNTER — Ambulatory Visit (INDEPENDENT_AMBULATORY_CARE_PROVIDER_SITE_OTHER): Payer: BLUE CROSS/BLUE SHIELD | Admitting: Psychology

## 2018-10-16 DIAGNOSIS — F064 Anxiety disorder due to known physiological condition: Secondary | ICD-10-CM

## 2018-10-21 ENCOUNTER — Ambulatory Visit (INDEPENDENT_AMBULATORY_CARE_PROVIDER_SITE_OTHER): Payer: BLUE CROSS/BLUE SHIELD | Admitting: Psychology

## 2018-10-21 DIAGNOSIS — F064 Anxiety disorder due to known physiological condition: Secondary | ICD-10-CM | POA: Diagnosis not present

## 2018-10-29 ENCOUNTER — Ambulatory Visit (INDEPENDENT_AMBULATORY_CARE_PROVIDER_SITE_OTHER): Payer: BLUE CROSS/BLUE SHIELD | Admitting: Psychology

## 2018-10-29 DIAGNOSIS — F064 Anxiety disorder due to known physiological condition: Secondary | ICD-10-CM | POA: Diagnosis not present

## 2018-11-05 ENCOUNTER — Ambulatory Visit (INDEPENDENT_AMBULATORY_CARE_PROVIDER_SITE_OTHER): Payer: BLUE CROSS/BLUE SHIELD | Admitting: Psychology

## 2018-11-05 DIAGNOSIS — F064 Anxiety disorder due to known physiological condition: Secondary | ICD-10-CM

## 2018-11-13 DIAGNOSIS — Z0289 Encounter for other administrative examinations: Secondary | ICD-10-CM

## 2018-11-19 ENCOUNTER — Ambulatory Visit (INDEPENDENT_AMBULATORY_CARE_PROVIDER_SITE_OTHER): Payer: BLUE CROSS/BLUE SHIELD | Admitting: Psychology

## 2018-11-19 DIAGNOSIS — F064 Anxiety disorder due to known physiological condition: Secondary | ICD-10-CM

## 2018-12-04 ENCOUNTER — Ambulatory Visit (INDEPENDENT_AMBULATORY_CARE_PROVIDER_SITE_OTHER): Payer: BLUE CROSS/BLUE SHIELD | Admitting: Psychology

## 2018-12-04 DIAGNOSIS — F064 Anxiety disorder due to known physiological condition: Secondary | ICD-10-CM | POA: Diagnosis not present

## 2018-12-18 ENCOUNTER — Ambulatory Visit (INDEPENDENT_AMBULATORY_CARE_PROVIDER_SITE_OTHER): Payer: BLUE CROSS/BLUE SHIELD | Admitting: Psychology

## 2018-12-18 DIAGNOSIS — F064 Anxiety disorder due to known physiological condition: Secondary | ICD-10-CM | POA: Diagnosis not present

## 2019-01-01 ENCOUNTER — Ambulatory Visit (INDEPENDENT_AMBULATORY_CARE_PROVIDER_SITE_OTHER): Payer: BC Managed Care – PPO | Admitting: Psychology

## 2019-01-01 DIAGNOSIS — F064 Anxiety disorder due to known physiological condition: Secondary | ICD-10-CM

## 2019-01-08 ENCOUNTER — Inpatient Hospital Stay (HOSPITAL_COMMUNITY): Payer: BC Managed Care – PPO | Attending: Hematology

## 2019-01-08 ENCOUNTER — Other Ambulatory Visit: Payer: Self-pay

## 2019-01-08 DIAGNOSIS — D509 Iron deficiency anemia, unspecified: Secondary | ICD-10-CM | POA: Diagnosis not present

## 2019-01-08 DIAGNOSIS — D473 Essential (hemorrhagic) thrombocythemia: Secondary | ICD-10-CM | POA: Diagnosis not present

## 2019-01-08 DIAGNOSIS — E559 Vitamin D deficiency, unspecified: Secondary | ICD-10-CM | POA: Insufficient documentation

## 2019-01-08 DIAGNOSIS — D75839 Thrombocytosis, unspecified: Secondary | ICD-10-CM

## 2019-01-08 DIAGNOSIS — Z79899 Other long term (current) drug therapy: Secondary | ICD-10-CM | POA: Diagnosis not present

## 2019-01-08 LAB — CBC WITH DIFFERENTIAL/PLATELET
Abs Immature Granulocytes: 0.06 10*3/uL (ref 0.00–0.07)
Basophils Absolute: 0.1 10*3/uL (ref 0.0–0.1)
Basophils Relative: 0 %
Eosinophils Absolute: 0.2 10*3/uL (ref 0.0–0.5)
Eosinophils Relative: 1 %
HCT: 40.4 % (ref 36.0–46.0)
Hemoglobin: 12.8 g/dL (ref 12.0–15.0)
Immature Granulocytes: 0 %
Lymphocytes Relative: 22 %
Lymphs Abs: 3 10*3/uL (ref 0.7–4.0)
MCH: 28.1 pg (ref 26.0–34.0)
MCHC: 31.7 g/dL (ref 30.0–36.0)
MCV: 88.6 fL (ref 80.0–100.0)
Monocytes Absolute: 1 10*3/uL (ref 0.1–1.0)
Monocytes Relative: 7 %
Neutro Abs: 9.3 10*3/uL — ABNORMAL HIGH (ref 1.7–7.7)
Neutrophils Relative %: 70 %
Platelets: 497 10*3/uL — ABNORMAL HIGH (ref 150–400)
RBC: 4.56 MIL/uL (ref 3.87–5.11)
RDW: 14.4 % (ref 11.5–15.5)
WBC: 13.6 10*3/uL — ABNORMAL HIGH (ref 4.0–10.5)
nRBC: 0 % (ref 0.0–0.2)

## 2019-01-08 LAB — IRON AND TIBC
Iron: 54 ug/dL (ref 28–170)
Saturation Ratios: 16 % (ref 10.4–31.8)
TIBC: 329 ug/dL (ref 250–450)
UIBC: 275 ug/dL

## 2019-01-08 LAB — FERRITIN: Ferritin: 48 ng/mL (ref 11–307)

## 2019-01-08 LAB — FOLATE: Folate: 14.6 ng/mL (ref >5.9)

## 2019-01-08 LAB — VITAMIN B12: Vitamin B-12: 342 pg/mL (ref 180–914)

## 2019-01-09 LAB — VITAMIN D 25 HYDROXY (VIT D DEFICIENCY, FRACTURES): Vit D, 25-Hydroxy: 25.3 ng/mL — ABNORMAL LOW (ref 30.0–100.0)

## 2019-01-14 ENCOUNTER — Other Ambulatory Visit: Payer: Self-pay

## 2019-01-15 ENCOUNTER — Inpatient Hospital Stay (HOSPITAL_BASED_OUTPATIENT_CLINIC_OR_DEPARTMENT_OTHER): Payer: BC Managed Care – PPO | Admitting: Hematology

## 2019-01-15 ENCOUNTER — Encounter (HOSPITAL_COMMUNITY): Payer: Self-pay | Admitting: Hematology

## 2019-01-15 ENCOUNTER — Ambulatory Visit (INDEPENDENT_AMBULATORY_CARE_PROVIDER_SITE_OTHER): Payer: BC Managed Care – PPO | Admitting: Psychology

## 2019-01-15 VITALS — BP 134/94 | HR 85 | Temp 98.4°F | Resp 16 | Wt 303.8 lb

## 2019-01-15 DIAGNOSIS — F064 Anxiety disorder due to known physiological condition: Secondary | ICD-10-CM | POA: Diagnosis not present

## 2019-01-15 DIAGNOSIS — D509 Iron deficiency anemia, unspecified: Secondary | ICD-10-CM

## 2019-01-15 DIAGNOSIS — D72829 Elevated white blood cell count, unspecified: Secondary | ICD-10-CM

## 2019-01-15 DIAGNOSIS — D473 Essential (hemorrhagic) thrombocythemia: Secondary | ICD-10-CM | POA: Diagnosis not present

## 2019-01-15 DIAGNOSIS — E559 Vitamin D deficiency, unspecified: Secondary | ICD-10-CM | POA: Diagnosis not present

## 2019-01-15 DIAGNOSIS — Z79899 Other long term (current) drug therapy: Secondary | ICD-10-CM

## 2019-01-15 DIAGNOSIS — D75839 Thrombocytosis, unspecified: Secondary | ICD-10-CM

## 2019-01-15 DIAGNOSIS — D72825 Bandemia: Secondary | ICD-10-CM

## 2019-01-15 MED ORDER — ERGOCALCIFEROL 1.25 MG (50000 UT) PO CAPS: 50000.0000 [IU] | ORAL_CAPSULE | ORAL | 1 refills | Status: DC

## 2019-01-15 NOTE — Patient Instructions (Signed)
[place]  [facility] at [facility]  Discharge Instructions:  You saw Dr. Katragadda today. _______________________________________________________________  Thank you for choosing [facility] at [facility] to provide your oncology and hematology care.  To afford each patient quality time with our providers, please arrive at least 15 minutes before your scheduled appointment.  You need to re-schedule your appointment if you arrive 10 or more minutes late.  We strive to give you quality time with our providers, and arriving late affects you and other patients whose appointments are after yours.  Also, if you no show three or more times for appointments you may be dismissed from the clinic.  Again, thank you for choosing [facility] at [facility]. Our hope is that these requests will allow you access to exceptional care and in a timely manner. _______________________________________________________________  If you have questions after your visit, please contact our office at [phone] between the hours of 8:30 a.m. and 5:00 p.m. Voicemails left after 4:30 p.m. will not be returned until the following business day. _______________________________________________________________  For prescription refill requests, have your pharmacy contact our office. _______________________________________________________________  Recommendations made by the consultant and any test results will be sent to your referring physician. _______________________________________________________________ 

## 2019-01-15 NOTE — Assessment & Plan Note (Signed)
1.  Leukocytosis and thrombocytosis: - She had mildly elevated white count and platelet count with a normal hemoglobin checked at Dr. Juel Burrow office in February 2020. - Her CBC on epic from 2013 showed slightly increased white count of 11.9 and platelet count of 438. - Her spleen is intact.  She is not on chronic steroids.  She is never smoker. - Testing for myeloproliferative disorders including BCR/ABL by FISH and Jak 2 V6 1 7 with reflex testing are negative. - ANA was mildly positive with 1 and 160 dilution.  She does not have any clinical symptoms of lupus. - We are delaying bone marrow aspiration and biopsy at this time.  Repeat labs on 01/08/2019 shows more or less stable white count and platelet count. -We will reevaluate in 3 months.  If there is any significant change, will consider bone marrow biopsy.  2.  Vitamin D deficiency:  - She is on vitamin D 50,000 units started in March 2020.  Latest vitamin D has improved to 25.3 from 11.1. -She will continue vitamin D 50,000 units weekly.  We will check it at next visit.  3.  Iron deficiency: -She is taking iron tablet daily.  Latest ferritin has improved to 48.  She will continue iron tablet daily.

## 2019-01-15 NOTE — Progress Notes (Signed)
Shannon Brooks, Shannon Brooks   CLINIC:  Medical Oncology/Hematology  PCP:  Celene Squibb, MD [place] [address] [phone]   REASON FOR VISIT:  Follow-up for Increase WBC and increased platelets.      INTERVAL HISTORY:  Shannon Brooks 33 y.o. female returns for follow-up of her elevated white count and platelet count.  Denies any recent infections in the last 3 months.  Denies fevers, night sweats or weight loss.  She is taking iron tablet 1 daily without any problems.  She is also continuing vitamin D 1 tablet daily without any side effects.  Her baseline fatigue has been stable.  Shortness of breath from asthma is also stable.  She has nausea at times which is also stable.  She occasionally gets headaches and dizziness which is also stable.  No ER visits or hospitalizations.    REVIEW OF SYSTEMS:  Review of Systems  Constitutional: Positive for fatigue.  Gastrointestinal: Positive for diarrhea and nausea.  Neurological: Positive for dizziness.  All other systems reviewed and are negative.    PAST MEDICAL/SURGICAL HISTORY:  Past Medical History:  Diagnosis Date  . Asthma   . Broken toe   . Carpal tunnel syndrome   . Migraine   . Near syncope Episodic near-syncope  . Obesity, unspecified    Obesity  . Vertigo    Past Surgical History:  Procedure Laterality Date  . TONSILLECTOMY    . WISDOM TOOTH EXTRACTION Bilateral      SOCIAL HISTORY:  Social History   Socioeconomic History  . Marital status: Married    Spouse name: Not on file  . Number of children: 0  . Years of education: some college  . Highest education level: Not on file  Occupational History  . Occupation: Unemployed  Social Needs  . Financial resource strain: Not on file  . Food insecurity    Worry: Not on file    Inability: Not on file  . Transportation needs    Medical: Not on file    Non-medical: Not on file  Tobacco Use   . Smoking status: Never Smoker  . Smokeless tobacco: Never Used  Substance and Sexual Activity  . Alcohol use: No    Frequency: Never  . Drug use: No  . Sexual activity: Yes    Birth control/protection: Pill  Lifestyle  . Physical activity    Days per week: Not on file    Minutes per session: Not on file  . Stress: Not on file  Relationships  . Social Herbalist on phone: Not on file    Gets together: Not on file    Attends religious service: Not on file    Active member of club or organization: Not on file    Attends meetings of clubs or organizations: Not on file    Relationship status: Not on file  . Intimate partner violence    Fear of current or ex partner: Not on file    Emotionally abused: Not on file    Physically abused: Not on file    Forced sexual activity: Not on file  Other Topics Concern  . Not on file  Social History Narrative   Lives at home with spouse.   Right-handed.    Occasional caffeine use.    FAMILY HISTORY:  Family History  Problem Relation Age of Onset  . Deafness Mother   . Diabetes  Mother   . Diabetes Maternal Grandmother   . Brain cancer Maternal Grandmother   . Diabetes Maternal Grandfather   . Multiple sclerosis Other   . Lupus Other     CURRENT MEDICATIONS:  Outpatient Encounter Medications as of 01/15/2019  Medication Sig  . ferrous sulfate 325 (65 FE) MG tablet Take 325 mg by mouth daily with breakfast.  . albuterol (PROVENTIL HFA;VENTOLIN HFA) 108 (90 BASE) MCG/ACT inhaler Inhale 2 puffs into the lungs every 6 (six) hours as needed for wheezing or shortness of breath.   Marland Kitchen albuterol (PROVENTIL) (5 MG/ML) 0.5% nebulizer solution Take 0.5 mLs (2.5 mg total) by nebulization every 6 (six) hours as needed for wheezing or shortness of breath.  . cetirizine (ZYRTEC) 10 MG chewable tablet Chew 10 mg by mouth daily.  . ergocalciferol (VITAMIN D2) 1.25 MG (50000 UT) capsule Take 1 capsule (50,000 Units total) by mouth once a week.   Marland Kitchen guaiFENesin (MUCINEX) 600 MG 12 hr tablet Take 600 mg by mouth 2 (two) times daily.  Marland Kitchen ibuprofen (ADVIL,MOTRIN) 800 MG tablet Take 800 mg by mouth every 8 (eight) hours as needed.  . JENCYCLA 0.35 MG tablet TAKE 1 TABLET BY MOUTH ONCE DAILY  . magnesium oxide (MAG-OX) 400 MG tablet Take 400 mg by mouth 2 (two) times daily.  . Potassium 99 MG TABS Take by mouth at bedtime.   . Riboflavin (VITAMIN B2 PO) Take by mouth 2 (two) times daily.   . SUMAtriptan (IMITREX) 50 MG tablet Take 1 tablet (50 mg total) by mouth every 2 (two) hours as needed for migraine. May repeat in 2 hours if headache persists or recurs.  . topiramate (TOPAMAX) 100 MG tablet Take 1 tablet (100 mg total) by mouth 2 (two) times daily.  . [DISCONTINUED] ergocalciferol (VITAMIN D2) 1.25 MG (50000 UT) capsule Take 1 capsule (50,000 Units total) by mouth once a week.   No facility-administered encounter medications on file as of 01/15/2019.     ALLERGIES:  Allergies  Allergen Reactions  . Penicillins   . Desipramine Rash     PHYSICAL EXAM:  ECOG Performance status: 1  Vitals:   01/15/19 1110  BP: (!) 134/94  Pulse: 85  Resp: 16  Temp: 98.4 F (36.9 C)  SpO2: 99%   Filed Weights   01/15/19 1110  Weight: (!) 303 lb 12.8 oz (137.8 kg)    Physical Exam Constitutional:      Appearance: Normal appearance.  Cardiovascular:     Rate and Rhythm: Normal rate and regular rhythm.     Heart sounds: Normal heart sounds.  Pulmonary:     Effort: Pulmonary effort is normal.     Breath sounds: Normal breath sounds.  Abdominal:     Palpations: Abdomen is soft.  Skin:    General: Skin is warm.  Neurological:     General: No focal deficit present.     Mental Status: She is alert and oriented to person, place, and time.  Psychiatric:        Mood and Affect: Mood normal.        Behavior: Behavior normal.      LABORATORY DATA:  I have reviewed the labs as listed.  CBC    Component Value Date/Time   WBC 13.6  (H) 01/08/2019 1130   RBC 4.56 01/08/2019 1130   HGB 12.8 01/08/2019 1130   HCT 40.4 01/08/2019 1130   PLT 497 (H) 01/08/2019 1130   MCV 88.6 01/08/2019 1130   MCH 28.1  01/08/2019 1130   MCHC 31.7 01/08/2019 1130   RDW 14.4 01/08/2019 1130   LYMPHSABS 3.0 01/08/2019 1130   MONOABS 1.0 01/08/2019 1130   EOSABS 0.2 01/08/2019 1130   BASOSABS 0.1 01/08/2019 1130   CMP Latest Ref Rng & Units 09/05/2018 04/30/2012  Glucose 70 - 99 mg/dL 88 89  BUN 6 - 20 mg/dL 13 13  Creatinine 0.44 - 1.00 mg/dL 0.76 0.62  Sodium 135 - 145 mmol/L 134(L) 136  Potassium 3.5 - 5.1 mmol/L 3.7 4.0  Chloride 98 - 111 mmol/L 107 101  CO2 22 - 32 mmol/L 19(L) 24  Calcium 8.9 - 10.3 mg/dL 9.0 9.6  Total Protein 6.5 - 8.1 g/dL 7.4 -  Total Bilirubin 0.3 - 1.2 mg/dL 0.5 -  Alkaline Phos 38 - 126 U/L 77 -  AST 15 - 41 U/L 32 -  ALT 0 - 44 U/L 47(H) -       DIAGNOSTIC IMAGING:  I have independently reviewed the scans and discussed with the patient.   ASSESSMENT & PLAN:   Leukocytosis 1.  Leukocytosis and thrombocytosis: - She had mildly elevated white count and platelet count with a normal hemoglobin checked at Dr. Juel Burrow office in February 2020. - Her CBC on epic from 2013 showed slightly increased white count of 11.9 and platelet count of 438. - Her spleen is intact.  She is not on chronic steroids.  She is never smoker. - Testing for myeloproliferative disorders including BCR/ABL by FISH and Jak 2 V6 1 7 with reflex testing are negative. - ANA was mildly positive with 1 and 160 dilution.  She does not have any clinical symptoms of lupus. - We are delaying bone marrow aspiration and biopsy at this time.  Repeat labs on 01/08/2019 shows more or less stable white count and platelet count. -We will reevaluate in 3 months.  If there is any significant change, will consider bone marrow biopsy.  2.  Vitamin D deficiency:  - She is on vitamin D 50,000 units started in March 2020.  Latest vitamin D has  improved to 25.3 from 11.1. -She will continue vitamin D 50,000 units weekly.  We will check it at next visit.  3.  Iron deficiency: -She is taking iron tablet daily.  Latest ferritin has improved to 48.  She will continue iron tablet daily.  Total time spent is 25 minutes with more than 50% of the time spent face-to-face discussing blood test results, further treatment options and coordination of care.    Orders placed this encounter:  Orders Placed This Encounter  Procedures  . CBC with Differential  . Comprehensive metabolic panel  . Ferritin  . Vitamin B12  . Iron and TIBC  . Folate  . Vitamin D 25 hydroxy      Derek Jack, MD [place] [phone]

## 2019-01-29 ENCOUNTER — Ambulatory Visit (INDEPENDENT_AMBULATORY_CARE_PROVIDER_SITE_OTHER): Payer: BC Managed Care – PPO | Admitting: Psychology

## 2019-01-29 DIAGNOSIS — F064 Anxiety disorder due to known physiological condition: Secondary | ICD-10-CM

## 2019-02-12 ENCOUNTER — Ambulatory Visit (INDEPENDENT_AMBULATORY_CARE_PROVIDER_SITE_OTHER): Payer: BC Managed Care – PPO | Admitting: Psychology

## 2019-02-12 DIAGNOSIS — F064 Anxiety disorder due to known physiological condition: Secondary | ICD-10-CM

## 2019-02-28 DIAGNOSIS — E782 Mixed hyperlipidemia: Secondary | ICD-10-CM | POA: Diagnosis not present

## 2019-03-05 ENCOUNTER — Ambulatory Visit (INDEPENDENT_AMBULATORY_CARE_PROVIDER_SITE_OTHER): Payer: BC Managed Care – PPO | Admitting: Psychology

## 2019-03-05 DIAGNOSIS — F064 Anxiety disorder due to known physiological condition: Secondary | ICD-10-CM | POA: Diagnosis not present

## 2019-03-19 ENCOUNTER — Ambulatory Visit (INDEPENDENT_AMBULATORY_CARE_PROVIDER_SITE_OTHER): Payer: BC Managed Care – PPO | Admitting: Psychology

## 2019-03-19 DIAGNOSIS — F064 Anxiety disorder due to known physiological condition: Secondary | ICD-10-CM | POA: Diagnosis not present

## 2019-03-25 DIAGNOSIS — D72829 Elevated white blood cell count, unspecified: Secondary | ICD-10-CM | POA: Diagnosis not present

## 2019-03-25 DIAGNOSIS — E782 Mixed hyperlipidemia: Secondary | ICD-10-CM | POA: Diagnosis not present

## 2019-03-25 DIAGNOSIS — R197 Diarrhea, unspecified: Secondary | ICD-10-CM | POA: Diagnosis not present

## 2019-03-25 DIAGNOSIS — D473 Essential (hemorrhagic) thrombocythemia: Secondary | ICD-10-CM | POA: Diagnosis not present

## 2019-04-02 ENCOUNTER — Ambulatory Visit (INDEPENDENT_AMBULATORY_CARE_PROVIDER_SITE_OTHER): Payer: BC Managed Care – PPO | Admitting: Psychology

## 2019-04-02 DIAGNOSIS — F064 Anxiety disorder due to known physiological condition: Secondary | ICD-10-CM | POA: Diagnosis not present

## 2019-04-15 ENCOUNTER — Other Ambulatory Visit: Payer: Self-pay

## 2019-04-15 ENCOUNTER — Inpatient Hospital Stay (HOSPITAL_COMMUNITY): Payer: BC Managed Care – PPO | Attending: Hematology

## 2019-04-15 DIAGNOSIS — Z23 Encounter for immunization: Secondary | ICD-10-CM | POA: Insufficient documentation

## 2019-04-15 DIAGNOSIS — R7989 Other specified abnormal findings of blood chemistry: Secondary | ICD-10-CM | POA: Insufficient documentation

## 2019-04-15 DIAGNOSIS — R42 Dizziness and giddiness: Secondary | ICD-10-CM | POA: Insufficient documentation

## 2019-04-15 DIAGNOSIS — R197 Diarrhea, unspecified: Secondary | ICD-10-CM | POA: Insufficient documentation

## 2019-04-15 DIAGNOSIS — E559 Vitamin D deficiency, unspecified: Secondary | ICD-10-CM | POA: Diagnosis not present

## 2019-04-15 DIAGNOSIS — D72829 Elevated white blood cell count, unspecified: Secondary | ICD-10-CM | POA: Diagnosis not present

## 2019-04-15 DIAGNOSIS — D473 Essential (hemorrhagic) thrombocythemia: Secondary | ICD-10-CM

## 2019-04-15 DIAGNOSIS — D509 Iron deficiency anemia, unspecified: Secondary | ICD-10-CM | POA: Diagnosis not present

## 2019-04-15 DIAGNOSIS — D75839 Thrombocytosis, unspecified: Secondary | ICD-10-CM

## 2019-04-15 LAB — COMPREHENSIVE METABOLIC PANEL
ALT: 37 U/L (ref 0–44)
AST: 21 U/L (ref 15–41)
Albumin: 4 g/dL (ref 3.5–5.0)
Alkaline Phosphatase: 63 U/L (ref 38–126)
Anion gap: 8 (ref 5–15)
BUN: 12 mg/dL (ref 6–20)
CO2: 24 mmol/L (ref 22–32)
Calcium: 9.7 mg/dL (ref 8.9–10.3)
Chloride: 106 mmol/L (ref 98–111)
Creatinine, Ser: 0.87 mg/dL (ref 0.44–1.00)
GFR calc Af Amer: >60 mL/min (ref >60)
GFR calc non Af Amer: >60 mL/min (ref >60)
Glucose, Bld: 108 mg/dL — ABNORMAL HIGH (ref 70–99)
Potassium: 3.9 mmol/L (ref 3.5–5.1)
Sodium: 138 mmol/L (ref 135–145)
Total Bilirubin: 0.3 mg/dL (ref 0.3–1.2)
Total Protein: 7.6 g/dL (ref 6.5–8.1)

## 2019-04-15 LAB — CBC WITH DIFFERENTIAL/PLATELET
Abs Immature Granulocytes: 0.04 10*3/uL (ref 0.00–0.07)
Basophils Absolute: 0.1 10*3/uL (ref 0.0–0.1)
Basophils Relative: 1 %
Eosinophils Absolute: 0.1 10*3/uL (ref 0.0–0.5)
Eosinophils Relative: 1 %
HCT: 43.3 % (ref 36.0–46.0)
Hemoglobin: 13.8 g/dL (ref 12.0–15.0)
Immature Granulocytes: 0 %
Lymphocytes Relative: 26 %
Lymphs Abs: 2.6 10*3/uL (ref 0.7–4.0)
MCH: 28.7 pg (ref 26.0–34.0)
MCHC: 31.9 g/dL (ref 30.0–36.0)
MCV: 90 fL (ref 80.0–100.0)
Monocytes Absolute: 0.8 10*3/uL (ref 0.1–1.0)
Monocytes Relative: 8 %
Neutro Abs: 6.4 10*3/uL (ref 1.7–7.7)
Neutrophils Relative %: 64 %
Platelets: 481 10*3/uL — ABNORMAL HIGH (ref 150–400)
RBC: 4.81 MIL/uL (ref 3.87–5.11)
RDW: 13.5 % (ref 11.5–15.5)
WBC: 9.9 10*3/uL (ref 4.0–10.5)
nRBC: 0 % (ref 0.0–0.2)

## 2019-04-15 LAB — IRON AND TIBC
Iron: 92 ug/dL (ref 28–170)
Saturation Ratios: 27 % (ref 10.4–31.8)
TIBC: 343 ug/dL (ref 250–450)
UIBC: 251 ug/dL

## 2019-04-15 LAB — FERRITIN: Ferritin: 60 ng/mL (ref 11–307)

## 2019-04-15 LAB — FOLATE: Folate: 12.9 ng/mL (ref >5.9)

## 2019-04-15 LAB — VITAMIN B12: Vitamin B-12: 336 pg/mL (ref 180–914)

## 2019-04-16 ENCOUNTER — Ambulatory Visit (INDEPENDENT_AMBULATORY_CARE_PROVIDER_SITE_OTHER): Payer: BC Managed Care – PPO | Admitting: Psychology

## 2019-04-16 DIAGNOSIS — F064 Anxiety disorder due to known physiological condition: Secondary | ICD-10-CM | POA: Diagnosis not present

## 2019-04-16 LAB — VITAMIN D 25 HYDROXY (VIT D DEFICIENCY, FRACTURES): Vit D, 25-Hydroxy: 29.2 ng/mL — ABNORMAL LOW (ref 30.0–100.0)

## 2019-04-22 ENCOUNTER — Inpatient Hospital Stay (HOSPITAL_BASED_OUTPATIENT_CLINIC_OR_DEPARTMENT_OTHER): Payer: BC Managed Care – PPO | Admitting: Hematology

## 2019-04-22 ENCOUNTER — Encounter (HOSPITAL_COMMUNITY): Payer: Self-pay | Admitting: Hematology

## 2019-04-22 ENCOUNTER — Other Ambulatory Visit: Payer: Self-pay

## 2019-04-22 VITALS — BP 134/92 | HR 100 | Temp 98.0°F | Resp 18 | Wt 295.9 lb

## 2019-04-22 DIAGNOSIS — D473 Essential (hemorrhagic) thrombocythemia: Secondary | ICD-10-CM

## 2019-04-22 DIAGNOSIS — Z0000 Encounter for general adult medical examination without abnormal findings: Secondary | ICD-10-CM | POA: Diagnosis not present

## 2019-04-22 DIAGNOSIS — R197 Diarrhea, unspecified: Secondary | ICD-10-CM | POA: Diagnosis not present

## 2019-04-22 DIAGNOSIS — R42 Dizziness and giddiness: Secondary | ICD-10-CM | POA: Diagnosis not present

## 2019-04-22 DIAGNOSIS — D509 Iron deficiency anemia, unspecified: Secondary | ICD-10-CM | POA: Diagnosis not present

## 2019-04-22 DIAGNOSIS — D75839 Thrombocytosis, unspecified: Secondary | ICD-10-CM

## 2019-04-22 DIAGNOSIS — Z23 Encounter for immunization: Secondary | ICD-10-CM | POA: Diagnosis not present

## 2019-04-22 DIAGNOSIS — E559 Vitamin D deficiency, unspecified: Secondary | ICD-10-CM | POA: Diagnosis not present

## 2019-04-22 DIAGNOSIS — R7989 Other specified abnormal findings of blood chemistry: Secondary | ICD-10-CM | POA: Diagnosis not present

## 2019-04-22 DIAGNOSIS — D72829 Elevated white blood cell count, unspecified: Secondary | ICD-10-CM | POA: Diagnosis not present

## 2019-04-22 MED ORDER — INFLUENZA VAC SPLIT QUAD 0.5 ML IM SUSY
0.5000 mL | PREFILLED_SYRINGE | Freq: Once | INTRAMUSCULAR | Status: AC
  Administered 2019-04-22: 11:00:00 0.5 mL via INTRAMUSCULAR
  Filled 2019-04-22: qty 0.5

## 2019-04-22 NOTE — Patient Instructions (Signed)
[facility] at [facility] Discharge Instructions  You were seen today by Dr. Katragadda. He went over your recent lab results. He will see you back in 6 months for labs and follow up.   Thank you for choosing [facility] at [facility] to provide your oncology and hematology care.  To afford each patient quality time with our provider, please arrive at least 15 minutes before your scheduled appointment time.   If you have a lab appointment with the Cancer Center please come in thru the  Main Entrance and check in at the main information desk  You need to re-schedule your appointment should you arrive 10 or more minutes late.  We strive to give you quality time with our providers, and arriving late affects you and other patients whose appointments are after yours.  Also, if you no show three or more times for appointments you may be dismissed from the clinic at the providers discretion.     Again, thank you for choosing [facility].  Our hope is that these requests will decrease the amount of time that you wait before being seen by our physicians.       _____________________________________________________________  Should you have questions after your visit to [facility], please contact our office at [phone] between the hours of 8:00 a.m. and 4:30 p.m.  Voicemails left after 4:00 p.m. will not be returned until the following business day.  For prescription refill requests, have your pharmacy contact our office and allow 72 hours.    Cancer Center Support Programs:   > Cancer Support Group  2nd Tuesday of the month 1pm-2pm, Journey Room    

## 2019-05-14 ENCOUNTER — Ambulatory Visit (INDEPENDENT_AMBULATORY_CARE_PROVIDER_SITE_OTHER): Payer: BC Managed Care – PPO | Admitting: Psychology

## 2019-05-14 DIAGNOSIS — F064 Anxiety disorder due to known physiological condition: Secondary | ICD-10-CM | POA: Diagnosis not present

## 2019-05-24 ENCOUNTER — Encounter (HOSPITAL_COMMUNITY): Payer: Self-pay | Admitting: Hematology

## 2019-05-24 NOTE — Progress Notes (Signed)
Shannon Brooks, [place] [address]   CLINIC:  Medical Oncology/Hematology  PCP:  Celene Squibb, MD [place] [address] [phone]   REASON FOR VISIT:  Follow-up for Increase WBC and increased platelets.      INTERVAL HISTORY:  Ms. Shannon Brooks 33 y.o. female seen for follow-up of thrombocytosis and leukocytosis.  Appetite is 25%.  Energy levels are 50%.  Occasional dizziness present.  Chronic diarrhea stable.  Denies any fevers, night sweats or weight loss.  Denies any infections or hospitalizations.  Denies any recent steroid use.    REVIEW OF SYSTEMS:  Review of Systems  Constitutional: Negative for fatigue.  Gastrointestinal: Positive for diarrhea.  Neurological: Positive for dizziness.  All other systems reviewed and are negative.    PAST MEDICAL/SURGICAL HISTORY:  Past Medical History:  Diagnosis Date  . Asthma   . Broken toe   . Carpal tunnel syndrome   . Migraine   . Near syncope Episodic near-syncope  . Obesity, unspecified    Obesity  . Vertigo    Past Surgical History:  Procedure Laterality Date  . TONSILLECTOMY    . WISDOM TOOTH EXTRACTION Bilateral      SOCIAL HISTORY:  Social History   Socioeconomic History  . Marital status: Married    Spouse name: Not on file  . Number of children: 0  . Years of education: some college  . Highest education level: Not on file  Occupational History  . Occupation: Unemployed  Social Needs  . Financial resource strain: Not on file  . Food insecurity    Worry: Not on file    Inability: Not on file  . Transportation needs    Medical: Not on file    Non-medical: Not on file  Tobacco Use  . Smoking status: Never Smoker  . Smokeless tobacco: Never Used  Substance and Sexual Activity  . Alcohol use: No    Frequency: Never  . Drug use: No  . Sexual activity: Yes    Birth control/protection: Pill  Lifestyle  . Physical activity    Days per week:  Not on file    Minutes per session: Not on file  . Stress: Not on file  Relationships  . Social Herbalist on phone: Not on file    Gets together: Not on file    Attends religious service: Not on file    Active member of club or organization: Not on file    Attends meetings of clubs or organizations: Not on file    Relationship status: Not on file  . Intimate partner violence    Fear of current or ex partner: Not on file    Emotionally abused: Not on file    Physically abused: Not on file    Forced sexual activity: Not on file  Other Topics Concern  . Not on file  Social History Narrative   Lives at home with spouse.   Right-handed.    Occasional caffeine use.    FAMILY HISTORY:  Family History  Problem Relation Age of Onset  . Deafness Mother   . Diabetes Mother   . Diabetes Maternal Grandmother   . Brain cancer Maternal Grandmother   . Diabetes Maternal Grandfather   . Multiple sclerosis Other   . Lupus Other     CURRENT MEDICATIONS:  Outpatient Encounter Medications as of 04/22/2019  Medication Sig  . cetirizine (ZYRTEC) 10 MG chewable  tablet Chew 10 mg by mouth daily.  . ergocalciferol (VITAMIN D2) 1.25 MG (50000 UT) capsule Take 1 capsule (50,000 Units total) by mouth once a week.  . ferrous sulfate 325 (65 FE) MG tablet Take 325 mg by mouth daily with breakfast.  . guaiFENesin (MUCINEX) 600 MG 12 hr tablet Take 600 mg by mouth 2 (two) times daily.  . JENCYCLA 0.35 MG tablet TAKE 1 TABLET BY MOUTH ONCE DAILY  . magnesium oxide (MAG-OX) 400 MG tablet Take 400 mg by mouth 2 (two) times daily.  . Potassium 99 MG TABS Take by mouth at bedtime.   . Riboflavin (VITAMIN B2 PO) Take by mouth 2 (two) times daily.   Marland Kitchen topiramate (TOPAMAX) 100 MG tablet Take 1 tablet (100 mg total) by mouth 2 (two) times daily.  Marland Kitchen albuterol (PROVENTIL HFA;VENTOLIN HFA) 108 (90 BASE) MCG/ACT inhaler Inhale 2 puffs into the lungs every 6 (six) hours as needed for wheezing or shortness  of breath.   Marland Kitchen albuterol (PROVENTIL) (5 MG/ML) 0.5% nebulizer solution Take 0.5 mLs (2.5 mg total) by nebulization every 6 (six) hours as needed for wheezing or shortness of breath. (Patient not taking: Reported on 04/22/2019)  . hyoscyamine (LEVSIN) 0.125 MG tablet TAKE 1 TABLET BY MOUTH EVERY 6 HOURS AS NEEDED FOR DIARRHEA  . ibuprofen (ADVIL,MOTRIN) 800 MG tablet Take 800 mg by mouth every 8 (eight) hours as needed.  . SUMAtriptan (IMITREX) 50 MG tablet Take 1 tablet (50 mg total) by mouth every 2 (two) hours as needed for migraine. May repeat in 2 hours if headache persists or recurs. (Patient not taking: Reported on 04/22/2019)  . [EXPIRED] influenza vac split quadrivalent PF (FLUARIX) injection 0.5 mL    No facility-administered encounter medications on file as of 04/22/2019.     ALLERGIES:  Allergies  Allergen Reactions  . Penicillins   . Desipramine Rash     PHYSICAL EXAM:  ECOG Performance status: 1  Vitals:   04/22/19 1039  BP: (!) 134/92  Pulse: 100  Resp: 18  Temp: 98 F (36.7 C)  SpO2: 97%   Filed Weights   04/22/19 1039  Weight: 295 lb 14.4 oz (134.2 kg)    Physical Exam Constitutional:      Appearance: Normal appearance.  Cardiovascular:     Rate and Rhythm: Normal rate and regular rhythm.     Heart sounds: Normal heart sounds.  Pulmonary:     Effort: Pulmonary effort is normal.     Breath sounds: Normal breath sounds.  Abdominal:     Palpations: Abdomen is soft.  Skin:    General: Skin is warm.  Neurological:     General: No focal deficit present.     Mental Status: She is alert and oriented to person, place, and time.  Psychiatric:        Mood and Affect: Mood normal.        Behavior: Behavior normal.      LABORATORY DATA:  I have reviewed the labs as listed.  CBC    Component Value Date/Time   WBC 9.9 04/15/2019 1106   RBC 4.81 04/15/2019 1106   HGB 13.8 04/15/2019 1106   HCT 43.3 04/15/2019 1106   PLT 481 (H) 04/15/2019 1106   MCV  90.0 04/15/2019 1106   MCH 28.7 04/15/2019 1106   MCHC 31.9 04/15/2019 1106   RDW 13.5 04/15/2019 1106   LYMPHSABS 2.6 04/15/2019 1106   MONOABS 0.8 04/15/2019 1106   EOSABS 0.1 04/15/2019 1106  BASOSABS 0.1 04/15/2019 1106   CMP Latest Ref Rng & Units 04/15/2019 09/05/2018 04/30/2012  Glucose 70 - 99 mg/dL 108(H) 88 89  BUN 6 - 20 mg/dL 12 13 13   Creatinine 0.44 - 1.00 mg/dL 0.87 0.76 0.62  Sodium 135 - 145 mmol/L 138 134(L) 136  Potassium 3.5 - 5.1 mmol/L 3.9 3.7 4.0  Chloride 98 - 111 mmol/L 106 107 101  CO2 22 - 32 mmol/L 24 19(L) 24  Calcium 8.9 - 10.3 mg/dL 9.7 9.0 9.6  Total Protein 6.5 - 8.1 g/dL 7.6 7.4 -  Total Bilirubin 0.3 - 1.2 mg/dL 0.3 0.5 -  Alkaline Phos 38 - 126 U/L 63 77 -  AST 15 - 41 U/L 21 32 -  ALT 0 - 44 U/L 37 47(H) -       DIAGNOSTIC IMAGING:  I have independently reviewed the scans and discussed with the patient.   ASSESSMENT & PLAN:   Thrombocytosis ([place]) 1.  Leukocytosis and thrombocytosis: - She had mildly elevated white count and platelet count with a normal hemoglobin checked at Dr. Juel Burrow office in February 2020. - Her CBC on epic from 2013 showed slightly increased white count of 11.9 and platelet count of 438. - Her spleen is intact.  She is not on chronic steroids.  She is never smoker. - Testing for myeloproliferative disorders including BCR/ABL by FISH and Jak 2 V6 1 7 with reflex testing are negative. - ANA was mildly positive with 1 and 160 dilution.  She does not have any clinical symptoms of lupus. -We held off on bone marrow aspiration biopsy at this time. -She did not experience any fevers, night sweats or weight loss. -We reviewed her labs which showed normalization of white count and platelet count improved to 481. -We will see her back in 6 months for follow-up with repeat labs and exam.  If there is any significant change in her counts will consider bone marrow aspiration biopsy.  2.  Vitamin D deficiency:  - She is on  vitamin D 50,000 units started in March 2020.   -Vitamin D is 29.2.  He will continue 50,000 units weekly.  3.  Iron deficiency: -She is taking iron tablet daily.  Ferritin today is 60.  She will continue daily.   Orders placed this encounter:  Orders Placed This Encounter  Procedures  . CBC with Differential/Platelet  . Comprehensive metabolic panel      Derek Jack, MD [place] [phone]

## 2019-05-24 NOTE — Assessment & Plan Note (Signed)
1.  Leukocytosis and thrombocytosis: - She had mildly elevated white count and platelet count with a normal hemoglobin checked at Dr. Juel Burrow office in February 2020. - Her CBC on epic from 2013 showed slightly increased white count of 11.9 and platelet count of 438. - Her spleen is intact.  She is not on chronic steroids.  She is never smoker. - Testing for myeloproliferative disorders including BCR/ABL by FISH and Jak 2 V6 1 7 with reflex testing are negative. - ANA was mildly positive with 1 and 160 dilution.  She does not have any clinical symptoms of lupus. -We held off on bone marrow aspiration biopsy at this time. -She did not experience any fevers, night sweats or weight loss. -We reviewed her labs which showed normalization of white count and platelet count improved to 481. -We will see her back in 6 months for follow-up with repeat labs and exam.  If there is any significant change in her counts will consider bone marrow aspiration biopsy.  2.  Vitamin D deficiency:  - She is on vitamin D 50,000 units started in March 2020.   -Vitamin D is 29.2.  He will continue 50,000 units weekly.  3.  Iron deficiency: -She is taking iron tablet daily.  Ferritin today is 60.  She will continue daily.

## 2019-05-28 ENCOUNTER — Ambulatory Visit (INDEPENDENT_AMBULATORY_CARE_PROVIDER_SITE_OTHER): Payer: BC Managed Care – PPO | Admitting: Psychology

## 2019-05-28 DIAGNOSIS — F064 Anxiety disorder due to known physiological condition: Secondary | ICD-10-CM | POA: Diagnosis not present

## 2019-06-11 ENCOUNTER — Ambulatory Visit (INDEPENDENT_AMBULATORY_CARE_PROVIDER_SITE_OTHER): Payer: BC Managed Care – PPO | Admitting: Psychology

## 2019-06-11 DIAGNOSIS — F064 Anxiety disorder due to known physiological condition: Secondary | ICD-10-CM | POA: Diagnosis not present

## 2019-06-13 ENCOUNTER — Other Ambulatory Visit: Payer: Self-pay | Admitting: Advanced Practice Midwife

## 2019-06-16 ENCOUNTER — Other Ambulatory Visit: Payer: Self-pay | Admitting: Advanced Practice Midwife

## 2019-06-16 ENCOUNTER — Other Ambulatory Visit: Payer: Self-pay | Admitting: *Deleted

## 2019-06-16 MED ORDER — NORETHINDRONE 0.35 MG PO TABS: 1.0000 | ORAL_TABLET | Freq: Every day | ORAL | 3 refills | Status: DC

## 2019-06-25 ENCOUNTER — Ambulatory Visit (INDEPENDENT_AMBULATORY_CARE_PROVIDER_SITE_OTHER): Payer: BC Managed Care – PPO | Admitting: Psychology

## 2019-06-25 DIAGNOSIS — F064 Anxiety disorder due to known physiological condition: Secondary | ICD-10-CM | POA: Diagnosis not present

## 2019-07-04 ENCOUNTER — Other Ambulatory Visit (HOSPITAL_COMMUNITY): Payer: Self-pay | Admitting: Hematology

## 2019-07-04 DIAGNOSIS — E559 Vitamin D deficiency, unspecified: Secondary | ICD-10-CM

## 2019-07-09 ENCOUNTER — Ambulatory Visit (INDEPENDENT_AMBULATORY_CARE_PROVIDER_SITE_OTHER): Payer: BC Managed Care – PPO | Admitting: Psychology

## 2019-07-09 DIAGNOSIS — F064 Anxiety disorder due to known physiological condition: Secondary | ICD-10-CM

## 2019-07-23 ENCOUNTER — Ambulatory Visit (INDEPENDENT_AMBULATORY_CARE_PROVIDER_SITE_OTHER): Payer: BC Managed Care – PPO | Admitting: Psychology

## 2019-07-23 DIAGNOSIS — F064 Anxiety disorder due to known physiological condition: Secondary | ICD-10-CM

## 2019-08-06 ENCOUNTER — Ambulatory Visit: Payer: BC Managed Care – PPO | Admitting: Psychology

## 2019-08-08 ENCOUNTER — Ambulatory Visit (INDEPENDENT_AMBULATORY_CARE_PROVIDER_SITE_OTHER): Payer: BC Managed Care – PPO | Admitting: Psychology

## 2019-08-08 DIAGNOSIS — F064 Anxiety disorder due to known physiological condition: Secondary | ICD-10-CM

## 2019-08-20 ENCOUNTER — Ambulatory Visit: Payer: BC Managed Care – PPO | Admitting: Psychology

## 2019-08-23 ENCOUNTER — Ambulatory Visit (INDEPENDENT_AMBULATORY_CARE_PROVIDER_SITE_OTHER): Payer: BC Managed Care – PPO | Admitting: Psychology

## 2019-08-23 DIAGNOSIS — F064 Anxiety disorder due to known physiological condition: Secondary | ICD-10-CM | POA: Diagnosis not present

## 2019-09-03 ENCOUNTER — Ambulatory Visit (INDEPENDENT_AMBULATORY_CARE_PROVIDER_SITE_OTHER): Payer: BC Managed Care – PPO | Admitting: Psychology

## 2019-09-03 DIAGNOSIS — F064 Anxiety disorder due to known physiological condition: Secondary | ICD-10-CM

## 2019-09-17 ENCOUNTER — Ambulatory Visit (INDEPENDENT_AMBULATORY_CARE_PROVIDER_SITE_OTHER): Payer: BC Managed Care – PPO | Admitting: Psychology

## 2019-09-17 DIAGNOSIS — F064 Anxiety disorder due to known physiological condition: Secondary | ICD-10-CM | POA: Diagnosis not present

## 2019-10-01 ENCOUNTER — Ambulatory Visit (INDEPENDENT_AMBULATORY_CARE_PROVIDER_SITE_OTHER): Payer: BC Managed Care – PPO | Admitting: Psychology

## 2019-10-01 DIAGNOSIS — F064 Anxiety disorder due to known physiological condition: Secondary | ICD-10-CM

## 2019-10-02 DIAGNOSIS — F411 Generalized anxiety disorder: Secondary | ICD-10-CM | POA: Diagnosis not present

## 2019-10-02 DIAGNOSIS — Z9181 History of falling: Secondary | ICD-10-CM | POA: Diagnosis not present

## 2019-10-02 DIAGNOSIS — J4541 Moderate persistent asthma with (acute) exacerbation: Secondary | ICD-10-CM | POA: Diagnosis not present

## 2019-10-02 DIAGNOSIS — E6609 Other obesity due to excess calories: Secondary | ICD-10-CM | POA: Diagnosis not present

## 2019-10-03 ENCOUNTER — Other Ambulatory Visit (HOSPITAL_COMMUNITY): Payer: Self-pay | Admitting: Nurse Practitioner

## 2019-10-03 DIAGNOSIS — E559 Vitamin D deficiency, unspecified: Secondary | ICD-10-CM

## 2019-10-07 DIAGNOSIS — Z0001 Encounter for general adult medical examination with abnormal findings: Secondary | ICD-10-CM | POA: Diagnosis not present

## 2019-10-10 ENCOUNTER — Other Ambulatory Visit: Payer: Self-pay | Admitting: Neurology

## 2019-10-15 ENCOUNTER — Ambulatory Visit (INDEPENDENT_AMBULATORY_CARE_PROVIDER_SITE_OTHER): Payer: BC Managed Care – PPO | Admitting: Psychology

## 2019-10-15 DIAGNOSIS — F064 Anxiety disorder due to known physiological condition: Secondary | ICD-10-CM

## 2019-10-16 DIAGNOSIS — Z23 Encounter for immunization: Secondary | ICD-10-CM | POA: Diagnosis not present

## 2019-10-27 ENCOUNTER — Other Ambulatory Visit: Payer: Self-pay

## 2019-10-27 ENCOUNTER — Inpatient Hospital Stay (HOSPITAL_COMMUNITY): Payer: BC Managed Care – PPO | Attending: Hematology

## 2019-10-27 DIAGNOSIS — J45909 Unspecified asthma, uncomplicated: Secondary | ICD-10-CM | POA: Insufficient documentation

## 2019-10-27 DIAGNOSIS — E669 Obesity, unspecified: Secondary | ICD-10-CM | POA: Insufficient documentation

## 2019-10-27 DIAGNOSIS — R7989 Other specified abnormal findings of blood chemistry: Secondary | ICD-10-CM | POA: Insufficient documentation

## 2019-10-27 DIAGNOSIS — D75839 Thrombocytosis, unspecified: Secondary | ICD-10-CM

## 2019-10-27 DIAGNOSIS — Z79899 Other long term (current) drug therapy: Secondary | ICD-10-CM | POA: Diagnosis not present

## 2019-10-27 DIAGNOSIS — E559 Vitamin D deficiency, unspecified: Secondary | ICD-10-CM | POA: Insufficient documentation

## 2019-10-27 DIAGNOSIS — D473 Essential (hemorrhagic) thrombocythemia: Secondary | ICD-10-CM

## 2019-10-27 DIAGNOSIS — D72829 Elevated white blood cell count, unspecified: Secondary | ICD-10-CM | POA: Diagnosis not present

## 2019-10-27 LAB — CBC WITH DIFFERENTIAL/PLATELET
Abs Immature Granulocytes: 0.04 10*3/uL (ref 0.00–0.07)
Basophils Absolute: 0 10*3/uL (ref 0.0–0.1)
Basophils Relative: 0 %
Eosinophils Absolute: 0.2 10*3/uL (ref 0.0–0.5)
Eosinophils Relative: 2 %
HCT: 41.7 % (ref 36.0–46.0)
Hemoglobin: 13.1 g/dL (ref 12.0–15.0)
Immature Granulocytes: 0 %
Lymphocytes Relative: 28 %
Lymphs Abs: 2.9 10*3/uL (ref 0.7–4.0)
MCH: 28.5 pg (ref 26.0–34.0)
MCHC: 31.4 g/dL (ref 30.0–36.0)
MCV: 90.8 fL (ref 80.0–100.0)
Monocytes Absolute: 0.8 10*3/uL (ref 0.1–1.0)
Monocytes Relative: 8 %
Neutro Abs: 6.3 10*3/uL (ref 1.7–7.7)
Neutrophils Relative %: 62 %
Platelets: 465 10*3/uL — ABNORMAL HIGH (ref 150–400)
RBC: 4.59 MIL/uL (ref 3.87–5.11)
RDW: 13.7 % (ref 11.5–15.5)
WBC: 10.2 10*3/uL (ref 4.0–10.5)
nRBC: 0 % (ref 0.0–0.2)

## 2019-10-27 LAB — COMPREHENSIVE METABOLIC PANEL
ALT: 26 U/L (ref 0–44)
AST: 17 U/L (ref 15–41)
Albumin: 3.9 g/dL (ref 3.5–5.0)
Alkaline Phosphatase: 61 U/L (ref 38–126)
Anion gap: 9 (ref 5–15)
BUN: 10 mg/dL (ref 6–20)
CO2: 23 mmol/L (ref 22–32)
Calcium: 9.2 mg/dL (ref 8.9–10.3)
Chloride: 106 mmol/L (ref 98–111)
Creatinine, Ser: 0.77 mg/dL (ref 0.44–1.00)
GFR calc Af Amer: >60 mL/min (ref >60)
GFR calc non Af Amer: >60 mL/min (ref >60)
Glucose, Bld: 105 mg/dL — ABNORMAL HIGH (ref 70–99)
Potassium: 3.7 mmol/L (ref 3.5–5.1)
Sodium: 138 mmol/L (ref 135–145)
Total Bilirubin: 0.5 mg/dL (ref 0.3–1.2)
Total Protein: 7.2 g/dL (ref 6.5–8.1)

## 2019-10-28 ENCOUNTER — Ambulatory Visit (INDEPENDENT_AMBULATORY_CARE_PROVIDER_SITE_OTHER): Payer: BC Managed Care – PPO | Admitting: Psychology

## 2019-10-28 DIAGNOSIS — F064 Anxiety disorder due to known physiological condition: Secondary | ICD-10-CM

## 2019-10-29 ENCOUNTER — Ambulatory Visit: Payer: BC Managed Care – PPO | Admitting: Psychology

## 2019-11-03 ENCOUNTER — Inpatient Hospital Stay (HOSPITAL_BASED_OUTPATIENT_CLINIC_OR_DEPARTMENT_OTHER): Payer: BC Managed Care – PPO | Admitting: Hematology

## 2019-11-03 ENCOUNTER — Other Ambulatory Visit: Payer: Self-pay

## 2019-11-03 ENCOUNTER — Ambulatory Visit (HOSPITAL_COMMUNITY): Payer: BC Managed Care – PPO | Admitting: Hematology

## 2019-11-03 DIAGNOSIS — J45909 Unspecified asthma, uncomplicated: Secondary | ICD-10-CM | POA: Diagnosis not present

## 2019-11-03 DIAGNOSIS — R7989 Other specified abnormal findings of blood chemistry: Secondary | ICD-10-CM | POA: Diagnosis not present

## 2019-11-03 DIAGNOSIS — D473 Essential (hemorrhagic) thrombocythemia: Secondary | ICD-10-CM

## 2019-11-03 DIAGNOSIS — E559 Vitamin D deficiency, unspecified: Secondary | ICD-10-CM | POA: Diagnosis not present

## 2019-11-03 DIAGNOSIS — D72829 Elevated white blood cell count, unspecified: Secondary | ICD-10-CM | POA: Diagnosis not present

## 2019-11-03 DIAGNOSIS — E669 Obesity, unspecified: Secondary | ICD-10-CM | POA: Diagnosis not present

## 2019-11-03 DIAGNOSIS — Z79899 Other long term (current) drug therapy: Secondary | ICD-10-CM | POA: Diagnosis not present

## 2019-11-03 DIAGNOSIS — D75839 Thrombocytosis, unspecified: Secondary | ICD-10-CM

## 2019-11-03 NOTE — Assessment & Plan Note (Addendum)
1.  Leukocytosis and thrombocytosis: -She was found to have elevated white count and platelet count with normal hemoglobin since 2013. -She is a never smoker.  No prior history of splenectomy.  No history of steroid use. -Testing for myeloproliferative disorders including BCR/ABL by and JAK2 V617F with reflex testing was negative. -ANA was mildly positive with 1 in 160 dilution.  She does not have any clinical signs or symptoms of lupus. -Her white count has normalized 6 months ago.  We reviewed labs from 10/27/2019.  White count is 10.2 with normal differential. -Platelet count is slightly elevated at 465 but improved from last time at 481. -We will reevaluate her in 1 year with repeat blood counts and physical exam.  Will consider bone marrow biopsy if there is any significant changes.  2.  Vitamin D deficiency: -Last vitamin D in September 2020 was 29. -She will continue 50,000 units weekly.  3.  Iron deficiency: -Last ferritin was 60 on 04/15/2019.  She will continue iron tablet daily.

## 2019-11-03 NOTE — Progress Notes (Signed)
[place], [place] [address]   CLINIC:  Medical Oncology/Hematology  PCP:  Celene Squibb, MD [place] [address] [phone]   REASON FOR VISIT:  Elevated platelet count and white count.     INTERVAL HISTORY:  Ms. Jamie 34 y.o. female seen for follow-up of leukocytosis and thrombocytosis.  Denies any recent infections.  However reports discharge from the right breast.  She has a follow-up with her GYN tomorrow for this particular problem.  She has chronic cough and shortness of breath on exertion from asthma.  Denies any dental or skin infections.    REVIEW OF SYSTEMS:  Review of Systems  Respiratory: Positive for cough and shortness of breath.   All other systems reviewed and are negative.    PAST MEDICAL/SURGICAL HISTORY:  Past Medical History:  Diagnosis Date  . Asthma   . Broken toe   . Carpal tunnel syndrome   . Migraine   . Near syncope Episodic near-syncope  . Obesity, unspecified    Obesity  . Vertigo    Past Surgical History:  Procedure Laterality Date  . TONSILLECTOMY    . WISDOM TOOTH EXTRACTION Bilateral      SOCIAL HISTORY:  Social History   Socioeconomic History  . Marital status: Married    Spouse name: Not on file  . Number of children: 0  . Years of education: some college  . Highest education level: Not on file  Occupational History  . Occupation: Unemployed  Tobacco Use  . Smoking status: Never Smoker  . Smokeless tobacco: Never Used  Substance and Sexual Activity  . Alcohol use: No  . Drug use: No  . Sexual activity: Yes    Birth control/protection: Pill  Other Topics Concern  . Not on file  Social History Narrative   Lives at home with spouse.   Right-handed.    Occasional caffeine use.   Social Determinants of Health   Financial Resource Strain:   . Difficulty of Paying Living Expenses:   Food Insecurity:   . Worried About Charity fundraiser in the  Last Year:   . Arboriculturist in the Last Year:   Transportation Needs:   . Film/video editor (Medical):   Marland Kitchen Lack of Transportation (Non-Medical):   Physical Activity:   . Days of Exercise per Week:   . Minutes of Exercise per Session:   Stress:   . Feeling of Stress :   Social Connections:   . Frequency of Communication with Friends and Family:   . Frequency of Social Gatherings with Friends and Family:   . Attends Religious Services:   . Active Member of Clubs or Organizations:   . Attends Archivist Meetings:   Marland Kitchen Marital Status:   Intimate Partner Violence:   . Fear of Current or Ex-Partner:   . Emotionally Abused:   Marland Kitchen Physically Abused:   . Sexually Abused:     FAMILY HISTORY:  Family History  Problem Relation Age of Onset  . Deafness Mother   . Diabetes Mother   . Diabetes Maternal Grandmother   . Brain cancer Maternal Grandmother   . Diabetes Maternal Grandfather   . Multiple sclerosis Other   . Lupus Other     CURRENT MEDICATIONS:  Outpatient Encounter Medications as of 11/03/2019  Medication Sig  . cetirizine (ZYRTEC) 10 MG chewable tablet Chew 10 mg by mouth daily.  . cholecalciferol (  VITAMIN D3) 25 MCG (1000 UNIT) tablet Take 1,000 Units by mouth daily. Pt takes 2,000 units daily  . ferrous sulfate 325 (65 FE) MG tablet Take 325 mg by mouth daily with breakfast.  . guaiFENesin (MUCINEX) 600 MG 12 hr tablet Take 600 mg by mouth 2 (two) times daily.  Marland Kitchen ibuprofen (ADVIL,MOTRIN) 800 MG tablet Take 800 mg by mouth every 8 (eight) hours as needed.  . magnesium oxide (MAG-OX) 400 MG tablet Take 400 mg by mouth 2 (two) times daily.  . norethindrone (JENCYCLA) 0.35 MG tablet Take 1 tablet (0.35 mg total) by mouth daily.  . Potassium 99 MG TABS Take by mouth at bedtime.   . Riboflavin (VITAMIN B2 PO) Take by mouth 2 (two) times daily.   . SUMAtriptan (IMITREX) 50 MG tablet Take 1 tablet (50 mg total) by mouth every 2 (two) hours as needed for migraine.  May repeat in 2 hours if headache persists or recurs.  . topiramate (TOPAMAX) 100 MG tablet Take 1 tablet by mouth twice daily  . Vitamin D, Ergocalciferol, (DRISDOL) 1.25 MG (50000 UNIT) CAPS capsule Take 1 capsule by mouth once a week  . albuterol (PROVENTIL HFA;VENTOLIN HFA) 108 (90 BASE) MCG/ACT inhaler Inhale 2 puffs into the lungs every 6 (six) hours as needed for wheezing or shortness of breath.   Marland Kitchen albuterol (PROVENTIL) (5 MG/ML) 0.5% nebulizer solution Take 0.5 mLs (2.5 mg total) by nebulization every 6 (six) hours as needed for wheezing or shortness of breath. (Patient not taking: Reported on 04/22/2019)  . hyoscyamine (LEVSIN) 0.125 MG tablet TAKE 1 TABLET BY MOUTH EVERY 6 HOURS AS NEEDED FOR DIARRHEA   No facility-administered encounter medications on file as of 11/03/2019.    ALLERGIES:  Allergies  Allergen Reactions  . Penicillins   . Desipramine Rash     PHYSICAL EXAM:  ECOG Performance status: 1  Vitals:   11/03/19 1155  BP: (!) 141/77  Pulse: 84  Resp: 18  Temp: 97.7 F (36.5 C)  SpO2: 100%   Filed Weights   11/03/19 1155  Weight: (!) 301 lb (136.5 kg)    Physical Exam Constitutional:      Appearance: Normal appearance.  Cardiovascular:     Rate and Rhythm: Normal rate and regular rhythm.     Heart sounds: Normal heart sounds.  Pulmonary:     Effort: Pulmonary effort is normal.     Breath sounds: Normal breath sounds.  Abdominal:     Palpations: Abdomen is soft.  Skin:    General: Skin is warm.  Neurological:     General: No focal deficit present.     Mental Status: She is alert and oriented to person, place, and time.  Psychiatric:        Mood and Affect: Mood normal.        Behavior: Behavior normal.      LABORATORY DATA:  I have reviewed the labs as listed.  CBC    Component Value Date/Time   WBC 10.2 10/27/2019 1035   RBC 4.59 10/27/2019 1035   HGB 13.1 10/27/2019 1035   HCT 41.7 10/27/2019 1035   PLT 465 (H) 10/27/2019 1035   MCV  90.8 10/27/2019 1035   MCH 28.5 10/27/2019 1035   MCHC 31.4 10/27/2019 1035   RDW 13.7 10/27/2019 1035   LYMPHSABS 2.9 10/27/2019 1035   MONOABS 0.8 10/27/2019 1035   EOSABS 0.2 10/27/2019 1035   BASOSABS 0.0 10/27/2019 1035   CMP Latest Ref Rng & Units 10/27/2019  04/15/2019 09/05/2018  Glucose 70 - 99 mg/dL 105(H) 108(H) 88  BUN 6 - 20 mg/dL '10 12 13  '$ Creatinine 0.44 - 1.00 mg/dL 0.77 0.87 0.76  Sodium 135 - 145 mmol/L 138 138 134(L)  Potassium 3.5 - 5.1 mmol/L 3.7 3.9 3.7  Chloride 98 - 111 mmol/L 106 106 107  CO2 22 - 32 mmol/L 23 24 19(L)  Calcium 8.9 - 10.3 mg/dL 9.2 9.7 9.0  Total Protein 6.5 - 8.1 g/dL 7.2 7.6 7.4  Total Bilirubin 0.3 - 1.2 mg/dL 0.5 0.3 0.5  Alkaline Phos 38 - 126 U/L 61 63 77  AST 15 - 41 U/L 17 21 32  ALT 0 - 44 U/L 26 37 47(H)       DIAGNOSTIC IMAGING:  I have reviewed scans.   ASSESSMENT & PLAN:   Thrombocytosis ([place]) 1.  Leukocytosis and thrombocytosis: -She was found to have elevated white count and platelet count with normal hemoglobin since 2013. -She is a never smoker.  No prior history of splenectomy.  No history of steroid use. -Testing for myeloproliferative disorders including BCR/ABL by and JAK2 V617F with reflex testing was negative. -ANA was mildly positive with 1 in 160 dilution.  She does not have any clinical signs or symptoms of lupus. -Her white count has normalized 6 months ago.  We reviewed labs from 10/27/2019.  White count is 10.2 with normal differential. -Platelet count is slightly elevated at 465 but improved from last time at 481. -We will reevaluate her in 1 year with repeat blood counts and physical exam.  Will consider bone marrow biopsy if there is any significant changes.  2.  Vitamin D deficiency: -Last vitamin D in September 2020 was 29. -She will continue 50,000 units weekly.  3.  Iron deficiency: -Last ferritin was 60 on 04/15/2019.  She will continue iron tablet daily.   Orders placed this encounter:  Orders  Placed This Encounter  Procedures  . CBC with Differential  . Lactate dehydrogenase      Derek Jack, MD [place] [phone]

## 2019-11-03 NOTE — Patient Instructions (Signed)
[facility] Discharge Instructions  You were seen today by Dr. Katragadda. He went over your recent lab results. He will see you back in 1 year for labs and follow up.   Thank you for choosing [facility] to provide your oncology and hematology care.  To afford each patient quality time with our provider, please arrive at least 15 minutes before your scheduled appointment time.   If you have a lab appointment with the Cancer Center please come in thru the  Main Entrance and check in at the main information desk  You need to re-schedule your appointment should you arrive 10 or more minutes late.  We strive to give you quality time with our providers, and arriving late affects you and other patients whose appointments are after yours.  Also, if you no show three or more times for appointments you may be dismissed from the clinic at the providers discretion.     Again, thank you for choosing [facility].  Our hope is that these requests will decrease the amount of time that you wait before being seen by our physicians.       _____________________________________________________________  Should you have questions after your visit to  Cancer Center, please contact our office at [phone] between the hours of 8:00 a.m. and 4:30 p.m.  Voicemails left after 4:00 p.m. will not be returned until the following business day.  For prescription refill requests, have your pharmacy contact our office and allow 72 hours.    Cancer Center Support Programs:   > Cancer Support Group  2nd Tuesday of the month 1pm-2pm, Journey Room    

## 2019-11-06 ENCOUNTER — Encounter: Payer: Self-pay | Admitting: Advanced Practice Midwife

## 2019-11-06 ENCOUNTER — Other Ambulatory Visit: Payer: Self-pay

## 2019-11-06 ENCOUNTER — Ambulatory Visit (INDEPENDENT_AMBULATORY_CARE_PROVIDER_SITE_OTHER): Payer: Medicare Other | Admitting: Advanced Practice Midwife

## 2019-11-06 ENCOUNTER — Other Ambulatory Visit (HOSPITAL_COMMUNITY)
Admission: RE | Admit: 2019-11-06 | Discharge: 2019-11-06 | Disposition: A | Discharge status: Home / Self Care | Payer: BC Managed Care – PPO | Source: Ambulatory Visit | Attending: Advanced Practice Midwife | Admitting: Advanced Practice Midwife

## 2019-11-06 VITALS — BP 117/78 | HR 80 | Ht 65.0 in | Wt 303.0 lb

## 2019-11-06 DIAGNOSIS — N6452 Nipple discharge: Secondary | ICD-10-CM | POA: Insufficient documentation

## 2019-11-06 NOTE — Progress Notes (Signed)
[facility] Visit  Patient name: Shannon Brooks MRN [id number]  Date of birth: 1986-06-07  CC & HPI:  Shannon Brooks is a 34 y.o. Caucasian female presenting today for right nipple discharge.  She noticed it spontaneously leaking on 4/4 for many hours. No more spontaneous leaking, but drops will come out of 2 separate ducts when provoked. None from left breast  No pain/redness/tenderness/masses. No new meds except Vit D.   Pertinent History Reviewed:  Medical & Surgical Hx:   Past Medical History:  Diagnosis Date  . Asthma   . Broken toe   . Carpal tunnel syndrome   . Iron deficiency anemia   . Migraine   . Near syncope Episodic near-syncope  . Obesity, unspecified    Obesity  . Vertigo   . Vitamin D deficiency    Past Surgical History:  Procedure Laterality Date  . TONSILLECTOMY    . WISDOM TOOTH EXTRACTION Bilateral    Family History  Problem Relation Age of Onset  . Deafness Mother   . Diabetes Mother   . Diabetes Maternal Grandmother   . Brain cancer Maternal Grandmother   . Diabetes Maternal Grandfather   . Multiple sclerosis Other   . Lupus Other     Current Outpatient Medications:  .  albuterol (PROVENTIL HFA;VENTOLIN HFA) 108 (90 BASE) MCG/ACT inhaler, Inhale 2 puffs into the lungs every 6 (six) hours as needed for wheezing or shortness of breath. , Disp: , Rfl:  .  cetirizine (ZYRTEC) 10 MG chewable tablet, Chew 10 mg by mouth daily., Disp: , Rfl:  .  cholecalciferol (VITAMIN D3) 25 MCG (1000 UNIT) tablet, Take 1,000 Units by mouth daily. Pt takes 2,000 units daily, Disp: , Rfl:  .  ferrous sulfate 325 (65 FE) MG tablet, Take 325 mg by mouth daily with breakfast., Disp: , Rfl:  .  guaiFENesin (MUCINEX) 600 MG 12 hr tablet, Take 600 mg by mouth 2 (two) times daily., Disp: , Rfl:  .  hyoscyamine (LEVSIN) 0.125 MG tablet, TAKE 1 TABLET BY MOUTH EVERY 6 HOURS AS NEEDED FOR DIARRHEA, Disp: , Rfl:  .  magnesium oxide (MAG-OX) 400 MG tablet, Take 400 mg  by mouth 2 (two) times daily., Disp: , Rfl:  .  norethindrone (JENCYCLA) 0.35 MG tablet, Take 1 tablet (0.35 mg total) by mouth daily., Disp: 84 tablet, Rfl: 3 .  Potassium 99 MG TABS, Take by mouth at bedtime. , Disp: , Rfl:  .  Riboflavin (VITAMIN B2 PO), Take by mouth 2 (two) times daily. , Disp: , Rfl:  .  SUMAtriptan (IMITREX) 50 MG tablet, Take 1 tablet (50 mg total) by mouth every 2 (two) hours as needed for migraine. May repeat in 2 hours if headache persists or recurs., Disp: 12 tablet, Rfl: 11 .  topiramate (TOPAMAX) 100 MG tablet, Take 1 tablet by mouth twice daily, Disp: 60 tablet, Rfl: 0 .  Vitamin D, Ergocalciferol, (DRISDOL) 1.25 MG (50000 UNIT) CAPS capsule, Take 1 capsule by mouth once a week, Disp: 12 capsule, Rfl: 0 .  albuterol (PROVENTIL) (5 MG/ML) 0.5% nebulizer solution, Take 0.5 mLs (2.5 mg total) by nebulization every 6 (six) hours as needed for wheezing or shortness of breath. (Patient not taking: Reported on 04/22/2019), Disp: 20 mL, Rfl: 12 .  ibuprofen (ADVIL,MOTRIN) 800 MG tablet, Take 800 mg by mouth every 8 (eight) hours as needed., Disp: , Rfl:  Social History: Reviewed -  reports that she has never smoked. She has never used smokeless  tobacco.  Review of Systems:   Constitutional: Negative for fever and chills Eyes: Negative for visual disturbances Respiratory: Negative for shortness of breath, dyspnea Cardiovascular: Negative for chest pain or palpitations  Gastrointestinal: Negative for vomiting, diarrhea and constipation; no abdominal pain Genitourinary: Negative for dysuria and urgency, vaginal irritation or itching Musculoskeletal: Negative for back pain, joint pain, myalgias  Neurological: Negative for dizziness and headaches    Objective Findings:    Physical Examination: Vitals:   11/06/19 1356  BP: 117/78  Pulse: 80   General appearance - well appearing, and in no distress Mental status - alert, oriented to person, place, and time Chest:   Normal respiratory effort Heart - normal rate and regular rhythm Abdomen:  Soft, nontender Pelvic: deferred Musculoskeletal:  Normal range of motion without pain Extremities:  No edema    No results found for this or any previous visit (from the past 24 hour(s)).    Assessment & Plan:  A:   Nipple discharge, hopefully physiological P:  Check cytology, prolactin/TSH/CBC   No follow-ups on file.  Joaquim Lai Cresenzo-Dishmon CNM 11/06/2019 2:15 PM

## 2019-11-07 DIAGNOSIS — N6452 Nipple discharge: Secondary | ICD-10-CM | POA: Diagnosis not present

## 2019-11-08 DIAGNOSIS — Z23 Encounter for immunization: Secondary | ICD-10-CM | POA: Diagnosis not present

## 2019-11-08 LAB — CBC WITH DIFFERENTIAL/PLATELET
Basophils Absolute: 0.1 10*3/uL (ref 0.0–0.2)
Basos: 1 %
EOS (ABSOLUTE): 0.2 10*3/uL (ref 0.0–0.4)
Eos: 2 %
Hematocrit: 40.5 % (ref 34.0–46.6)
Hemoglobin: 13.2 g/dL (ref 11.1–15.9)
Immature Grans (Abs): 0 10*3/uL (ref 0.0–0.1)
Immature Granulocytes: 0 %
Lymphocytes Absolute: 3.4 10*3/uL — ABNORMAL HIGH (ref 0.7–3.1)
Lymphs: 28 %
MCH: 28.4 pg (ref 26.6–33.0)
MCHC: 32.6 g/dL (ref 31.5–35.7)
MCV: 87 fL (ref 79–97)
Monocytes Absolute: 1.2 10*3/uL — ABNORMAL HIGH (ref 0.1–0.9)
Monocytes: 10 %
Neutrophils Absolute: 7.2 10*3/uL — ABNORMAL HIGH (ref 1.4–7.0)
Neutrophils: 59 %
Platelets: 455 10*3/uL — ABNORMAL HIGH (ref 150–450)
RBC: 4.65 x10E6/uL (ref 3.77–5.28)
RDW: 12.7 % (ref 11.7–15.4)
WBC: 12.1 10*3/uL — ABNORMAL HIGH (ref 3.4–10.8)

## 2019-11-08 LAB — TSH: TSH: 1.41 u[IU]/mL (ref 0.450–4.500)

## 2019-11-08 LAB — PROLACTIN: Prolactin: 22.7 ng/mL (ref 4.8–23.3)

## 2019-11-10 ENCOUNTER — Other Ambulatory Visit: Payer: Self-pay | Admitting: Neurology

## 2019-11-10 ENCOUNTER — Other Ambulatory Visit: Payer: Self-pay | Admitting: Advanced Practice Midwife

## 2019-11-10 DIAGNOSIS — N6452 Nipple discharge: Secondary | ICD-10-CM

## 2019-11-10 LAB — CYTOLOGY - NON PAP

## 2019-11-10 MED ORDER — CEPHALEXIN 500 MG PO CAPS: 500.0000 mg | ORAL_CAPSULE | Freq: Three times a day (TID) | ORAL | 0 refills | Status: DC

## 2019-11-11 ENCOUNTER — Ambulatory Visit (HOSPITAL_COMMUNITY)
Admission: RE | Admit: 2019-11-11 | Discharge: 2019-11-11 | Disposition: A | Discharge status: Home / Self Care | Payer: BC Managed Care – PPO | Source: Ambulatory Visit | Attending: Advanced Practice Midwife | Admitting: Advanced Practice Midwife

## 2019-11-11 ENCOUNTER — Encounter (HOSPITAL_COMMUNITY): Payer: Self-pay

## 2019-11-11 ENCOUNTER — Other Ambulatory Visit: Payer: Self-pay

## 2019-11-11 DIAGNOSIS — N6452 Nipple discharge: Secondary | ICD-10-CM

## 2019-11-12 ENCOUNTER — Ambulatory Visit (INDEPENDENT_AMBULATORY_CARE_PROVIDER_SITE_OTHER): Payer: BC Managed Care – PPO | Admitting: Psychology

## 2019-11-12 DIAGNOSIS — F064 Anxiety disorder due to known physiological condition: Secondary | ICD-10-CM | POA: Diagnosis not present

## 2019-11-25 ENCOUNTER — Other Ambulatory Visit: Payer: Self-pay

## 2019-11-25 ENCOUNTER — Encounter: Payer: Medicare Other | Attending: Internal Medicine | Admitting: Nutrition

## 2019-11-25 ENCOUNTER — Encounter: Payer: Self-pay | Admitting: Nutrition

## 2019-11-25 ENCOUNTER — Encounter (HOSPITAL_COMMUNITY): Payer: BC Managed Care – PPO

## 2019-11-25 ENCOUNTER — Other Ambulatory Visit (HOSPITAL_COMMUNITY): Payer: BC Managed Care – PPO

## 2019-11-25 VITALS — Ht 65.0 in | Wt 304.6 lb

## 2019-11-25 DIAGNOSIS — E669 Obesity, unspecified: Secondary | ICD-10-CM

## 2019-11-25 NOTE — Progress Notes (Signed)
Medical Nutrition Therapy:  Appt start time: 1300 end time:  1400.   Assessment:  Primary concerns today: Obesity. PMH Asthma, Vertigo and Vit D deficiency, Anemia and carpal Tunnel syndrome. Seh also reports some OCD and ADHD. Has vestipular chronic migraines that causes syncope episodes. Marland Kitchen Her husband is a Administrator and is only home every 3 months. Currently on disability. Has physical bulemia. She notes food just comes up at times without trying. Has constant diarrhea. . She notes she is not as concerned about losing weight as she is about eating healthier.  Can't cook at home to vestibular chronic migraines.  Use microwave instead of stove due to risk of burning. Limited mobility due to migraines and physical aches and pains. Morbid obesity with BMI of 50. Elevated glucose levels. At high risk for Type 2 DM. Eats 1-2 meals per day. Sleeps in late in morning.  Wt Readings from Last 3 Encounters:  11/25/19 (!) 304 lb 9.6 oz (138.2 kg)  11/06/19 (!) 303 lb (137.4 kg)  11/03/19 (!) 301 lb (136.5 kg)   Ht Readings from Last 3 Encounters:  11/25/19 5\' 5"  (1.651 m)  11/06/19 5\' 5"  (1.651 m)  09/30/18 5\' 5"  (1.651 m)   Body mass index is 50.69 kg/m. @BMIFA @ Facility age limit for growth percentiles is 20 years. Facility age limit for growth percentiles is 20 years.  CMP Latest Ref Rng & Units 10/27/2019 04/15/2019 09/05/2018  Glucose 70 - 99 mg/dL 105(H) 108(H) 88  BUN 6 - 20 mg/dL 10 12 13   Creatinine 0.44 - 1.00 mg/dL 0.77 0.87 0.76  Sodium 135 - 145 mmol/L 138 138 134(L)  Potassium 3.5 - 5.1 mmol/L 3.7 3.9 3.7  Chloride 98 - 111 mmol/L 106 106 107  CO2 22 - 32 mmol/L 23 24 19(L)  Calcium 8.9 - 10.3 mg/dL 9.2 9.7 9.0  Total Protein 6.5 - 8.1 g/dL 7.2 7.6 7.4  Total Bilirubin 0.3 - 1.2 mg/dL 0.5 0.3 0.5  Alkaline Phos 38 - 126 U/L 61 63 77  AST 15 - 41 U/L 17 21 32  ALT 0 - 44 U/L 26 37 47(H)      Preferred Learning Style:   No preference indicated   Learning  Readiness:  Ready  Change in progress   MEDICATIONS:    DIETARY INTAKE:.    24-hr recall:  B  11 am AM): Brunch mixed green salad with 2 tbsp with viengarette of blue cheese crumbles, egg whites and dried cranberries, 3 strips of baked chicken strips 1 cup coffee with 3 packs stevia and creamer  D ( PM): Healthy Choice meal, water Snk ( PM):  Beverages: water  Usual physical activity: sometimes.  Estimated energy needs: 1200 calories 135 g carbohydrates 90 g protein 33 g fat  Progress Towards Goal(s):  In progress.   Nutritional Diagnosis:  NB-1.1 Food and nutrition-related knowledge deficit As related to Morbid Obesity.  As evidenced by BMI 50.    Intervention:  Nutrition and  Pre-Diabetes education provided on My Plate, CHO counting, meal planning, portion sizes, timing of meals, avoiding snacks between meals taking medications as prescribed, benefits of exercising 15- 30 minutes per day and prevention of DM. Weight loss tips.  Goals Eat three meals a day Cut back on stevia Keep food journal .  Teaching Method Utilized:  Visual Auditory Hands on  Handouts given during visit include:  The Plate Method   Weight loss tips  Meal Plan Card   Barriers to learning/adherence to  lifestyle change: None  Demonstrated degree of understanding via:  Teach Back   Monitoring/Evaluation:  Dietary intake, exercise,  and body weight in 1 month(s).

## 2019-11-25 NOTE — Patient Instructions (Signed)
Goals Eat three meals a day Cut back on stevia Keep food journal

## 2019-11-26 ENCOUNTER — Ambulatory Visit (INDEPENDENT_AMBULATORY_CARE_PROVIDER_SITE_OTHER): Payer: BC Managed Care – PPO | Admitting: Psychology

## 2019-11-26 DIAGNOSIS — F064 Anxiety disorder due to known physiological condition: Secondary | ICD-10-CM

## 2019-12-02 ENCOUNTER — Ambulatory Visit (HOSPITAL_COMMUNITY): Admission: RE | Admit: 2019-12-02 | Payer: BC Managed Care – PPO | Source: Ambulatory Visit

## 2019-12-02 ENCOUNTER — Ambulatory Visit (HOSPITAL_COMMUNITY)
Admission: RE | Admit: 2019-12-02 | Discharge: 2019-12-02 | Disposition: A | Discharge status: Home / Self Care | Payer: BC Managed Care – PPO | Source: Ambulatory Visit | Attending: Advanced Practice Midwife | Admitting: Advanced Practice Midwife

## 2019-12-02 ENCOUNTER — Other Ambulatory Visit: Payer: Self-pay

## 2019-12-02 DIAGNOSIS — N6452 Nipple discharge: Secondary | ICD-10-CM | POA: Diagnosis not present

## 2019-12-02 DIAGNOSIS — R928 Other abnormal and inconclusive findings on diagnostic imaging of breast: Secondary | ICD-10-CM | POA: Diagnosis not present

## 2019-12-04 DIAGNOSIS — Z713 Dietary counseling and surveillance: Secondary | ICD-10-CM | POA: Diagnosis not present

## 2019-12-10 ENCOUNTER — Ambulatory Visit: Payer: Medicare Other | Admitting: Psychology

## 2019-12-11 ENCOUNTER — Ambulatory Visit (INDEPENDENT_AMBULATORY_CARE_PROVIDER_SITE_OTHER): Payer: BC Managed Care – PPO | Admitting: Psychology

## 2019-12-11 DIAGNOSIS — F064 Anxiety disorder due to known physiological condition: Secondary | ICD-10-CM | POA: Diagnosis not present

## 2019-12-17 ENCOUNTER — Other Ambulatory Visit: Payer: Self-pay

## 2019-12-17 ENCOUNTER — Ambulatory Visit (INDEPENDENT_AMBULATORY_CARE_PROVIDER_SITE_OTHER): Payer: Medicare Other | Admitting: Neurology

## 2019-12-17 ENCOUNTER — Encounter: Payer: Self-pay | Admitting: Neurology

## 2019-12-17 VITALS — BP 148/87 | HR 62 | Ht 65.0 in | Wt 303.0 lb

## 2019-12-17 DIAGNOSIS — G43109 Migraine with aura, not intractable, without status migrainosus: Secondary | ICD-10-CM | POA: Diagnosis not present

## 2019-12-17 MED ORDER — TOPIRAMATE 100 MG PO TABS: 100.0000 mg | ORAL_TABLET | Freq: Two times a day (BID) | ORAL | 3 refills | Status: DC

## 2019-12-17 MED ORDER — SUMATRIPTAN SUCCINATE 50 MG PO TABS: 50.0000 mg | ORAL_TABLET | ORAL | 11 refills | Status: DC | PRN

## 2019-12-17 NOTE — Progress Notes (Signed)
PATIENT: Shannon Brooks DOB: Oct 14, 1985  REASON FOR VISIT: follow up HISTORY FROM: patient  HISTORY OF PRESENT ILLNESS: Today 12/17/19  HISTORY Shannon Brooks is a 34 years old female, seen in refer by his primary care doctor Celene Squibb for evaluation of dizziness, initial evaluation was on April 10 2017.  I reviewed and summarized the referring note, she had a past medical history of chronic migraine, obese, presented with frequent vertigo with associated headache since 2017   She had migraine headaches since 34 years old, her typical migraine are lateralized severe pounding headache with associated light noise sensitivity, nauseous, lasting for a few hours, was helped by caffeine, over-the-counter Excedrin Migraine, Tylenol ibuprofen as needed, sometimes her migraines are preceded by auras, visual distortion, dizziness,  She also complains of intermittent vertigo episodes since 2013, gradually getting worse especially since 2017, over the past few months, she has it almost on a weekly basis, sudden onset of unsteady gait, difficulty focusing, brain foggy sensation, lasting for few hours, caffeine usually helps her symptoms temporarily, oftentimes is followed by severe migraine headaches,  Stopping birth control in March 2018 seems to make her symptoms worse   Update July 10, 2017: She is now taking Topamax 100 mg twice a day, doing very well, lost 11 pounds over past 3 months, Imitrex as needed works well for her migraine headaches, and recurrent dizzy spells, continue have recurrent spells of lightheadedness, whole body tremor, without loss consciousness lasting for a few minutes, not necessarily associated with headaches,  UPDATE September 30 2018: She is over all happy with current migraine control, she is taking Topamax 200mg  daily, magnesium oxide, riboflavin 100mg  twice a day as preventive medications.    She has migraine once a week,  Sometimes precede by  dizziness, sleeping would help.    Imitrex 50mg  as needed was helpful.  She also came with a list of constellation of complaints, frequent body tremor, disturbance spells, staring at the computer screen,Watching TV, increased brain fog  EEG was normal in Dec 2018   Update Dec 17, 2019 SS: Overall happy with current migraine control, taking Topamax 100 mg twice a day, magnesium oxide, riboflavin for preventative medications.  Has headache with pain about once a month, will take Imitrex with good benefit.  Has daily dizziness/vertigo vision, "wake up on the boat", it may worsen throughout the day.  She carries a cane just in case.  She is now on disability.  She may have occasional sensation of cold or heat to crown of head during spells.  Daily dizzy spells have been chronic.  Here with husband, not planning to start a family currently.  Has close follow-up with PCP, found to have low vitamin D.  Topamax makes symptoms manageable.  REVIEW OF SYSTEMS: Out of a complete 14 system review of symptoms, the patient complains only of the following symptoms, and all other reviewed systems are negative.  Headache   ALLERGIES: Allergies  Allergen Reactions  . Penicillins   . Desipramine Rash    HOME MEDICATIONS: Outpatient Medications Prior to Visit  Medication Sig Dispense Refill  . albuterol (PROVENTIL HFA;VENTOLIN HFA) 108 (90 BASE) MCG/ACT inhaler Inhale 2 puffs into the lungs every 6 (six) hours as needed for wheezing or shortness of breath.     . cephALEXin (KEFLEX) 500 MG capsule Take 1 capsule (500 mg total) by mouth 3 (three) times daily. 21 capsule 0  . cetirizine (ZYRTEC) 10 MG chewable tablet Chew 10 mg by mouth  daily.    . cholecalciferol (VITAMIN D3) 25 MCG (1000 UNIT) tablet Take 1,000 Units by mouth daily. Pt takes 2,000 units daily    . ferrous sulfate 325 (65 FE) MG tablet Take 325 mg by mouth daily with breakfast.    . guaiFENesin (MUCINEX) 600 MG 12 hr tablet Take 600 mg by  mouth 2 (two) times daily.    . hyoscyamine (LEVSIN) 0.125 MG tablet TAKE 1 TABLET BY MOUTH EVERY 6 HOURS AS NEEDED FOR DIARRHEA    . ibuprofen (ADVIL,MOTRIN) 800 MG tablet Take 800 mg by mouth every 8 (eight) hours as needed.    . magnesium oxide (MAG-OX) 400 MG tablet Take 400 mg by mouth 2 (two) times daily.    . norethindrone (JENCYCLA) 0.35 MG tablet Take 1 tablet (0.35 mg total) by mouth daily. 84 tablet 3  . Potassium 99 MG TABS Take by mouth at bedtime.     . Riboflavin (VITAMIN B2 PO) Take by mouth 2 (two) times daily.     . Vitamin D, Ergocalciferol, (DRISDOL) 1.25 MG (50000 UNIT) CAPS capsule Take 1 capsule by mouth once a week 12 capsule 0  . SUMAtriptan (IMITREX) 50 MG tablet Take 1 tablet (50 mg total) by mouth every 2 (two) hours as needed for migraine. May repeat in 2 hours if headache persists or recurs. 12 tablet 11  . topiramate (TOPAMAX) 100 MG tablet Take 1 tablet by mouth twice daily 60 tablet 0  . albuterol (PROVENTIL) (5 MG/ML) 0.5% nebulizer solution Take 0.5 mLs (2.5 mg total) by nebulization every 6 (six) hours as needed for wheezing or shortness of breath. (Patient not taking: Reported on 04/22/2019) 20 mL 12   No facility-administered medications prior to visit.    PAST MEDICAL HISTORY: Past Medical History:  Diagnosis Date  . Asthma   . Broken toe   . Carpal tunnel syndrome   . Iron deficiency anemia   . Migraine   . Near syncope Episodic near-syncope  . Obesity, unspecified    Obesity  . Vertigo   . Vitamin D deficiency     PAST SURGICAL HISTORY: Past Surgical History:  Procedure Laterality Date  . TONSILLECTOMY    . WISDOM TOOTH EXTRACTION Bilateral     FAMILY HISTORY: Family History  Problem Relation Age of Onset  . Deafness Mother   . Diabetes Mother   . Diabetes Maternal Grandmother   . Brain cancer Maternal Grandmother   . Diabetes Maternal Grandfather   . Multiple sclerosis Other   . Lupus Other     SOCIAL HISTORY: Social History    Socioeconomic History  . Marital status: Married    Spouse name: Not on file  . Number of children: 0  . Years of education: some college  . Highest education level: Not on file  Occupational History  . Occupation: Unemployed  Tobacco Use  . Smoking status: Never Smoker  . Smokeless tobacco: Never Used  Substance and Sexual Activity  . Alcohol use: No  . Drug use: No  . Sexual activity: Yes    Birth control/protection: Pill  Other Topics Concern  . Not on file  Social History Narrative   Lives at home with spouse.   Right-handed.    Occasional caffeine use.   Social Determinants of Health   Financial Resource Strain:   . Difficulty of Paying Living Expenses:   Food Insecurity:   . Worried About Charity fundraiser in the Last Year:   . [employer]  of Food in the Last Year:   Transportation Needs:   . Film/video editor (Medical):   Marland Kitchen Lack of Transportation (Non-Medical):   Physical Activity:   . Days of Exercise per Week:   . Minutes of Exercise per Session:   Stress:   . Feeling of Stress :   Social Connections:   . Frequency of Communication with Friends and Family:   . Frequency of Social Gatherings with Friends and Family:   . Attends Religious Services:   . Active Member of Clubs or Organizations:   . Attends Archivist Meetings:   Marland Kitchen Marital Status:   Intimate Partner Violence:   . Fear of Current or Ex-Partner:   . Emotionally Abused:   Marland Kitchen Physically Abused:   . Sexually Abused:   PHYSICAL EXAM  Vitals:   12/17/19 0824  BP: (!) 148/87  Pulse: 62  Weight: (!) 303 lb (137.4 kg)  Height: 5\' 5"  (1.651 m)   Body mass index is 50.42 kg/m.  Generalized: Well developed, in no acute distress , obese  Neurological examination  Mentation: Alert oriented to time, place, history taking. Follows all commands speech and language fluent Cranial nerve II-XII: Pupils were equal round reactive to light. Extraocular movements were full, visual field  were full on confrontational test. Facial sensation and strength were normal. Head turning and shoulder shrug  were normal and symmetric. Motor: The motor testing reveals 5 over 5 strength of all 4 extremities. Good symmetric motor tone is noted throughout.  Sensory: Sensory testing is intact to soft touch on all 4 extremities. No evidence of extinction is noted.  Coordination: Cerebellar testing reveals good finger-nose-finger and heel-to-shin bilaterally.  Gait and station: Has to push off to stand, with gait, wide-based, cautious, mildly unsteady, relies on cane. Reflexes: Deep tendon reflexes are symmetric and normal bilaterally.   DIAGNOSTIC DATA (LABS, IMAGING, TESTING) - I reviewed patient records, labs, notes, testing and imaging myself where available.  Lab Results  Component Value Date   WBC 12.1 (H) 11/07/2019   HGB 13.2 11/07/2019   HCT 40.5 11/07/2019   MCV 87 11/07/2019   PLT 455 (H) 11/07/2019      Component Value Date/Time   NA 138 10/27/2019 1035   K 3.7 10/27/2019 1035   CL 106 10/27/2019 1035   CO2 23 10/27/2019 1035   GLUCOSE 105 (H) 10/27/2019 1035   BUN 10 10/27/2019 1035   CREATININE 0.77 10/27/2019 1035   CALCIUM 9.2 10/27/2019 1035   PROT 7.2 10/27/2019 1035   ALBUMIN 3.9 10/27/2019 1035   AST 17 10/27/2019 1035   ALT 26 10/27/2019 1035   ALKPHOS 61 10/27/2019 1035   BILITOT 0.5 10/27/2019 1035   GFRNONAA >60 10/27/2019 1035   GFRAA >60 10/27/2019 1035   No results found for: CHOL, HDL, LDLCALC, LDLDIRECT, TRIG, CHOLHDL No results found for: HGBA1C Lab Results  Component Value Date   VITAMINB12 336 04/15/2019   Lab Results  Component Value Date   TSH 1.410 11/07/2019      ASSESSMENT AND PLAN 34 y.o. year old female  has a past medical history of Asthma, Broken toe, Carpal tunnel syndrome, Iron deficiency anemia, Migraine, Near syncope (Episodic near-syncope), Obesity, unspecified, Vertigo, and Vitamin D deficiency. here with:  1.   Complicated migraine headache -Overall, stable, happy with current control -Continue Topamax 100 mg twice daily -Continue Imitrex 50 mg as needed -Follow-up in 1 year or sooner if needed  I spent 20 minutes of face-to-face  and non-face-to-face time with patient.  This included previsit chart review, lab review, study review, order entry, electronic health record documentation, patient education.  Butler Denmark, AGNP-C, DNP 12/17/2019, 9:10 AM [facility] [address], [place], [place] [address] [phone]

## 2019-12-17 NOTE — Patient Instructions (Signed)
It was nice to meet you today! Continue current medications See you back in 1 year or sooner

## 2019-12-24 ENCOUNTER — Ambulatory Visit (INDEPENDENT_AMBULATORY_CARE_PROVIDER_SITE_OTHER): Payer: BC Managed Care – PPO | Admitting: Psychology

## 2019-12-24 DIAGNOSIS — F064 Anxiety disorder due to known physiological condition: Secondary | ICD-10-CM

## 2020-01-07 ENCOUNTER — Ambulatory Visit (INDEPENDENT_AMBULATORY_CARE_PROVIDER_SITE_OTHER): Payer: BC Managed Care – PPO | Admitting: Psychology

## 2020-01-07 DIAGNOSIS — F064 Anxiety disorder due to known physiological condition: Secondary | ICD-10-CM | POA: Diagnosis not present

## 2020-01-08 ENCOUNTER — Ambulatory Visit: Payer: BC Managed Care – PPO | Admitting: Nutrition

## 2020-01-15 ENCOUNTER — Ambulatory Visit: Payer: BC Managed Care – PPO | Admitting: Nutrition

## 2020-01-20 DIAGNOSIS — Z713 Dietary counseling and surveillance: Secondary | ICD-10-CM | POA: Diagnosis not present

## 2020-01-21 ENCOUNTER — Ambulatory Visit (INDEPENDENT_AMBULATORY_CARE_PROVIDER_SITE_OTHER): Payer: BC Managed Care – PPO | Admitting: Psychology

## 2020-01-21 DIAGNOSIS — F064 Anxiety disorder due to known physiological condition: Secondary | ICD-10-CM | POA: Diagnosis not present

## 2020-02-04 ENCOUNTER — Ambulatory Visit (INDEPENDENT_AMBULATORY_CARE_PROVIDER_SITE_OTHER): Payer: BC Managed Care – PPO | Admitting: Psychology

## 2020-02-04 DIAGNOSIS — F064 Anxiety disorder due to known physiological condition: Secondary | ICD-10-CM | POA: Diagnosis not present

## 2020-02-18 ENCOUNTER — Ambulatory Visit (INDEPENDENT_AMBULATORY_CARE_PROVIDER_SITE_OTHER): Payer: BC Managed Care – PPO | Admitting: Psychology

## 2020-02-18 DIAGNOSIS — F064 Anxiety disorder due to known physiological condition: Secondary | ICD-10-CM | POA: Diagnosis not present

## 2020-03-03 ENCOUNTER — Ambulatory Visit (INDEPENDENT_AMBULATORY_CARE_PROVIDER_SITE_OTHER): Payer: BC Managed Care – PPO | Admitting: Psychology

## 2020-03-03 DIAGNOSIS — F064 Anxiety disorder due to known physiological condition: Secondary | ICD-10-CM | POA: Diagnosis not present

## 2020-03-17 ENCOUNTER — Ambulatory Visit (INDEPENDENT_AMBULATORY_CARE_PROVIDER_SITE_OTHER): Payer: BC Managed Care – PPO | Admitting: Psychology

## 2020-03-17 DIAGNOSIS — F064 Anxiety disorder due to known physiological condition: Secondary | ICD-10-CM

## 2020-03-31 ENCOUNTER — Ambulatory Visit (INDEPENDENT_AMBULATORY_CARE_PROVIDER_SITE_OTHER): Payer: BC Managed Care – PPO | Admitting: Psychology

## 2020-03-31 DIAGNOSIS — F064 Anxiety disorder due to known physiological condition: Secondary | ICD-10-CM

## 2020-04-09 DIAGNOSIS — E559 Vitamin D deficiency, unspecified: Secondary | ICD-10-CM | POA: Diagnosis not present

## 2020-04-09 DIAGNOSIS — D509 Iron deficiency anemia, unspecified: Secondary | ICD-10-CM | POA: Diagnosis not present

## 2020-04-09 DIAGNOSIS — J4541 Moderate persistent asthma with (acute) exacerbation: Secondary | ICD-10-CM | POA: Diagnosis not present

## 2020-04-09 DIAGNOSIS — E6609 Other obesity due to excess calories: Secondary | ICD-10-CM | POA: Diagnosis not present

## 2020-04-09 DIAGNOSIS — Z9181 History of falling: Secondary | ICD-10-CM | POA: Diagnosis not present

## 2020-04-09 DIAGNOSIS — F411 Generalized anxiety disorder: Secondary | ICD-10-CM | POA: Diagnosis not present

## 2020-04-13 DIAGNOSIS — D72829 Elevated white blood cell count, unspecified: Secondary | ICD-10-CM | POA: Diagnosis not present

## 2020-04-13 DIAGNOSIS — D473 Essential (hemorrhagic) thrombocythemia: Secondary | ICD-10-CM | POA: Diagnosis not present

## 2020-04-13 DIAGNOSIS — E782 Mixed hyperlipidemia: Secondary | ICD-10-CM | POA: Diagnosis not present

## 2020-04-13 DIAGNOSIS — R197 Diarrhea, unspecified: Secondary | ICD-10-CM | POA: Diagnosis not present

## 2020-04-14 ENCOUNTER — Ambulatory Visit: Payer: Medicare Other | Admitting: Psychology

## 2020-04-23 ENCOUNTER — Ambulatory Visit (INDEPENDENT_AMBULATORY_CARE_PROVIDER_SITE_OTHER): Payer: BC Managed Care – PPO | Admitting: Psychology

## 2020-04-23 DIAGNOSIS — F064 Anxiety disorder due to known physiological condition: Secondary | ICD-10-CM

## 2020-04-28 ENCOUNTER — Ambulatory Visit (INDEPENDENT_AMBULATORY_CARE_PROVIDER_SITE_OTHER): Payer: BC Managed Care – PPO | Admitting: Psychology

## 2020-04-28 DIAGNOSIS — F064 Anxiety disorder due to known physiological condition: Secondary | ICD-10-CM | POA: Diagnosis not present

## 2020-05-12 ENCOUNTER — Ambulatory Visit (INDEPENDENT_AMBULATORY_CARE_PROVIDER_SITE_OTHER): Payer: BC Managed Care – PPO | Admitting: Psychology

## 2020-05-12 DIAGNOSIS — F064 Anxiety disorder due to known physiological condition: Secondary | ICD-10-CM | POA: Diagnosis not present

## 2020-05-23 ENCOUNTER — Other Ambulatory Visit: Payer: Self-pay | Admitting: Obstetrics & Gynecology

## 2020-05-26 ENCOUNTER — Ambulatory Visit (INDEPENDENT_AMBULATORY_CARE_PROVIDER_SITE_OTHER): Payer: BC Managed Care – PPO | Admitting: Psychology

## 2020-05-26 DIAGNOSIS — F064 Anxiety disorder due to known physiological condition: Secondary | ICD-10-CM | POA: Diagnosis not present

## 2020-06-09 ENCOUNTER — Ambulatory Visit (INDEPENDENT_AMBULATORY_CARE_PROVIDER_SITE_OTHER): Payer: BC Managed Care – PPO | Admitting: Psychology

## 2020-06-09 DIAGNOSIS — F064 Anxiety disorder due to known physiological condition: Secondary | ICD-10-CM | POA: Diagnosis not present

## 2020-06-14 NOTE — Progress Notes (Signed)
I have reviewed and agreed above plan. 

## 2020-06-23 ENCOUNTER — Ambulatory Visit (INDEPENDENT_AMBULATORY_CARE_PROVIDER_SITE_OTHER): Payer: BC Managed Care – PPO | Admitting: Psychology

## 2020-06-23 DIAGNOSIS — F064 Anxiety disorder due to known physiological condition: Secondary | ICD-10-CM

## 2020-07-07 ENCOUNTER — Ambulatory Visit (INDEPENDENT_AMBULATORY_CARE_PROVIDER_SITE_OTHER): Payer: BC Managed Care – PPO | Admitting: Psychology

## 2020-07-07 DIAGNOSIS — F064 Anxiety disorder due to known physiological condition: Secondary | ICD-10-CM | POA: Diagnosis not present

## 2020-07-22 ENCOUNTER — Ambulatory Visit (INDEPENDENT_AMBULATORY_CARE_PROVIDER_SITE_OTHER): Payer: BC Managed Care – PPO | Admitting: Psychology

## 2020-07-22 DIAGNOSIS — F064 Anxiety disorder due to known physiological condition: Secondary | ICD-10-CM | POA: Diagnosis not present

## 2020-08-18 ENCOUNTER — Ambulatory Visit (INDEPENDENT_AMBULATORY_CARE_PROVIDER_SITE_OTHER): Payer: BC Managed Care – PPO | Admitting: Psychology

## 2020-08-18 DIAGNOSIS — F064 Anxiety disorder due to known physiological condition: Secondary | ICD-10-CM

## 2020-09-02 ENCOUNTER — Ambulatory Visit (INDEPENDENT_AMBULATORY_CARE_PROVIDER_SITE_OTHER): Payer: BC Managed Care – PPO | Admitting: Psychology

## 2020-09-02 DIAGNOSIS — F064 Anxiety disorder due to known physiological condition: Secondary | ICD-10-CM

## 2020-09-15 ENCOUNTER — Ambulatory Visit (INDEPENDENT_AMBULATORY_CARE_PROVIDER_SITE_OTHER): Payer: BC Managed Care – PPO | Admitting: Psychology

## 2020-09-15 DIAGNOSIS — F064 Anxiety disorder due to known physiological condition: Secondary | ICD-10-CM

## 2020-09-24 ENCOUNTER — Other Ambulatory Visit (HOSPITAL_COMMUNITY): Payer: Self-pay

## 2020-09-24 DIAGNOSIS — E559 Vitamin D deficiency, unspecified: Secondary | ICD-10-CM

## 2020-09-24 MED ORDER — VITAMIN D (ERGOCALCIFEROL) 1.25 MG (50000 UNIT) PO CAPS: 50000.0000 [IU] | ORAL_CAPSULE | ORAL | 0 refills | Status: DC

## 2020-09-29 ENCOUNTER — Ambulatory Visit (INDEPENDENT_AMBULATORY_CARE_PROVIDER_SITE_OTHER): Payer: BC Managed Care – PPO | Admitting: Psychology

## 2020-09-29 DIAGNOSIS — F064 Anxiety disorder due to known physiological condition: Secondary | ICD-10-CM | POA: Diagnosis not present

## 2020-10-08 DIAGNOSIS — D509 Iron deficiency anemia, unspecified: Secondary | ICD-10-CM | POA: Diagnosis not present

## 2020-10-08 DIAGNOSIS — R799 Abnormal finding of blood chemistry, unspecified: Secondary | ICD-10-CM | POA: Diagnosis not present

## 2020-10-08 DIAGNOSIS — E559 Vitamin D deficiency, unspecified: Secondary | ICD-10-CM | POA: Diagnosis not present

## 2020-10-08 DIAGNOSIS — E782 Mixed hyperlipidemia: Secondary | ICD-10-CM | POA: Diagnosis not present

## 2020-10-08 DIAGNOSIS — Z0000 Encounter for general adult medical examination without abnormal findings: Secondary | ICD-10-CM | POA: Diagnosis not present

## 2020-10-08 DIAGNOSIS — D72829 Elevated white blood cell count, unspecified: Secondary | ICD-10-CM | POA: Diagnosis not present

## 2020-10-08 DIAGNOSIS — D696 Thrombocytopenia, unspecified: Secondary | ICD-10-CM | POA: Diagnosis not present

## 2020-10-12 DIAGNOSIS — Z0001 Encounter for general adult medical examination with abnormal findings: Secondary | ICD-10-CM | POA: Diagnosis not present

## 2020-10-13 ENCOUNTER — Ambulatory Visit (INDEPENDENT_AMBULATORY_CARE_PROVIDER_SITE_OTHER): Payer: BC Managed Care – PPO | Admitting: Psychology

## 2020-10-13 DIAGNOSIS — F064 Anxiety disorder due to known physiological condition: Secondary | ICD-10-CM

## 2020-10-27 ENCOUNTER — Ambulatory Visit (INDEPENDENT_AMBULATORY_CARE_PROVIDER_SITE_OTHER): Payer: BC Managed Care – PPO | Admitting: Psychology

## 2020-10-27 DIAGNOSIS — F064 Anxiety disorder due to known physiological condition: Secondary | ICD-10-CM | POA: Diagnosis not present

## 2020-11-09 ENCOUNTER — Inpatient Hospital Stay (HOSPITAL_COMMUNITY): Payer: BC Managed Care – PPO | Attending: Hematology

## 2020-11-10 ENCOUNTER — Ambulatory Visit (INDEPENDENT_AMBULATORY_CARE_PROVIDER_SITE_OTHER): Payer: BC Managed Care – PPO | Admitting: Psychology

## 2020-11-10 DIAGNOSIS — F064 Anxiety disorder due to known physiological condition: Secondary | ICD-10-CM

## 2020-11-11 ENCOUNTER — Other Ambulatory Visit: Payer: Self-pay | Admitting: Obstetrics & Gynecology

## 2020-11-16 ENCOUNTER — Ambulatory Visit (HOSPITAL_COMMUNITY): Payer: BC Managed Care – PPO | Admitting: Hematology

## 2020-11-24 ENCOUNTER — Ambulatory Visit (INDEPENDENT_AMBULATORY_CARE_PROVIDER_SITE_OTHER): Payer: BC Managed Care – PPO | Admitting: Psychology

## 2020-11-24 ENCOUNTER — Other Ambulatory Visit (HOSPITAL_COMMUNITY): Payer: Self-pay

## 2020-11-24 DIAGNOSIS — F064 Anxiety disorder due to known physiological condition: Secondary | ICD-10-CM

## 2020-11-24 DIAGNOSIS — D75839 Thrombocytosis, unspecified: Secondary | ICD-10-CM

## 2020-11-24 NOTE — Progress Notes (Signed)
Order placed per Dr. Tomie China last note from 11/12/2019.

## 2020-11-25 ENCOUNTER — Other Ambulatory Visit: Payer: Self-pay

## 2020-11-25 ENCOUNTER — Inpatient Hospital Stay (HOSPITAL_COMMUNITY): Payer: BC Managed Care – PPO | Attending: Hematology

## 2020-11-25 DIAGNOSIS — R5383 Other fatigue: Secondary | ICD-10-CM | POA: Diagnosis not present

## 2020-11-25 DIAGNOSIS — D500 Iron deficiency anemia secondary to blood loss (chronic): Secondary | ICD-10-CM | POA: Diagnosis not present

## 2020-11-25 DIAGNOSIS — R197 Diarrhea, unspecified: Secondary | ICD-10-CM | POA: Diagnosis not present

## 2020-11-25 DIAGNOSIS — D72829 Elevated white blood cell count, unspecified: Secondary | ICD-10-CM | POA: Insufficient documentation

## 2020-11-25 DIAGNOSIS — E559 Vitamin D deficiency, unspecified: Secondary | ICD-10-CM | POA: Insufficient documentation

## 2020-11-25 DIAGNOSIS — G43809 Other migraine, not intractable, without status migrainosus: Secondary | ICD-10-CM | POA: Diagnosis not present

## 2020-11-25 DIAGNOSIS — G629 Polyneuropathy, unspecified: Secondary | ICD-10-CM | POA: Insufficient documentation

## 2020-11-25 DIAGNOSIS — Z808 Family history of malignant neoplasm of other organs or systems: Secondary | ICD-10-CM | POA: Diagnosis not present

## 2020-11-25 DIAGNOSIS — Z79899 Other long term (current) drug therapy: Secondary | ICD-10-CM | POA: Diagnosis not present

## 2020-11-25 DIAGNOSIS — D75839 Thrombocytosis, unspecified: Secondary | ICD-10-CM

## 2020-11-25 LAB — CBC WITH DIFFERENTIAL/PLATELET
Abs Immature Granulocytes: 0.08 10*3/uL — ABNORMAL HIGH (ref 0.00–0.07)
Basophils Absolute: 0.1 10*3/uL (ref 0.0–0.1)
Basophils Relative: 1 %
Eosinophils Absolute: 0.1 10*3/uL (ref 0.0–0.5)
Eosinophils Relative: 1 %
HCT: 43.7 % (ref 36.0–46.0)
Hemoglobin: 13.8 g/dL (ref 12.0–15.0)
Immature Granulocytes: 1 %
Lymphocytes Relative: 22 %
Lymphs Abs: 2.3 10*3/uL (ref 0.7–4.0)
MCH: 28.7 pg (ref 26.0–34.0)
MCHC: 31.6 g/dL (ref 30.0–36.0)
MCV: 90.9 fL (ref 80.0–100.0)
Monocytes Absolute: 0.8 10*3/uL (ref 0.1–1.0)
Monocytes Relative: 7 %
Neutro Abs: 7.1 10*3/uL (ref 1.7–7.7)
Neutrophils Relative %: 68 %
Platelets: 460 10*3/uL — ABNORMAL HIGH (ref 150–400)
RBC: 4.81 MIL/uL (ref 3.87–5.11)
RDW: 13.7 % (ref 11.5–15.5)
WBC: 10.4 10*3/uL (ref 4.0–10.5)
nRBC: 0 % (ref 0.0–0.2)

## 2020-11-25 LAB — LACTATE DEHYDROGENASE: LDH: 150 U/L (ref 98–192)

## 2020-12-02 ENCOUNTER — Inpatient Hospital Stay (HOSPITAL_BASED_OUTPATIENT_CLINIC_OR_DEPARTMENT_OTHER): Payer: BC Managed Care – PPO | Admitting: Physician Assistant

## 2020-12-02 ENCOUNTER — Other Ambulatory Visit: Payer: Self-pay

## 2020-12-02 VITALS — BP 144/97 | HR 98 | Temp 97.3°F | Resp 20 | Wt 308.5 lb

## 2020-12-02 DIAGNOSIS — E559 Vitamin D deficiency, unspecified: Secondary | ICD-10-CM

## 2020-12-02 DIAGNOSIS — D500 Iron deficiency anemia secondary to blood loss (chronic): Secondary | ICD-10-CM | POA: Diagnosis not present

## 2020-12-02 DIAGNOSIS — Z808 Family history of malignant neoplasm of other organs or systems: Secondary | ICD-10-CM | POA: Diagnosis not present

## 2020-12-02 DIAGNOSIS — D75839 Thrombocytosis, unspecified: Secondary | ICD-10-CM | POA: Diagnosis not present

## 2020-12-02 DIAGNOSIS — D72825 Bandemia: Secondary | ICD-10-CM

## 2020-12-02 DIAGNOSIS — R5383 Other fatigue: Secondary | ICD-10-CM | POA: Diagnosis not present

## 2020-12-02 DIAGNOSIS — R197 Diarrhea, unspecified: Secondary | ICD-10-CM | POA: Diagnosis not present

## 2020-12-02 DIAGNOSIS — D72829 Elevated white blood cell count, unspecified: Secondary | ICD-10-CM | POA: Diagnosis not present

## 2020-12-02 DIAGNOSIS — Z79899 Other long term (current) drug therapy: Secondary | ICD-10-CM | POA: Diagnosis not present

## 2020-12-02 DIAGNOSIS — G629 Polyneuropathy, unspecified: Secondary | ICD-10-CM | POA: Diagnosis not present

## 2020-12-02 DIAGNOSIS — G43809 Other migraine, not intractable, without status migrainosus: Secondary | ICD-10-CM | POA: Diagnosis not present

## 2020-12-02 NOTE — Progress Notes (Signed)
[facility] [address], [place] [address]   CLINIC:  Medical Oncology/Hematology  PCP:  Celene Squibb, MD [place] [address] [phone]   REASON FOR VISIT:  Follow-up for leukocytosis and thrombocytosis  CURRENT THERAPY: Observation  INTERVAL HISTORY:  Shannon Brooks 35 y.o. female returns for routine follow-up of her leukocytosis and thrombocytosis.  She was last seen in office by Dr. Delton Coombes on 11/03/2019.  At today's visit, she reports that she has been doing fair.  She has significant fatigue, which she attributes to her frequent vestibular migraines and recent decreased dose of supplemental vitamin D per her PCP.  She has shortness of breath and cough related to seasonal allergies and asthma.  She has chronic diarrhea.  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  No fever, chills, unintentional weight loss.  Denies any signs or symptoms of blood loss.  No current signs or symptoms of blood clots.  She denies any dental infection, skin infection, or systemic infections; no current steroid use.  She has very little energy and 50% appetite. She endorses that she is maintaining a stable weight.    REVIEW OF SYSTEMS:  Review of Systems  Constitutional: Positive for fatigue. Negative for appetite change, chills, diaphoresis, fever and unexpected weight change.  HENT:   Negative for lump/mass and nosebleeds.   Eyes: Negative for eye problems.  Respiratory: Positive for cough and shortness of breath. Negative for hemoptysis.   Cardiovascular: Negative for chest pain, leg swelling and palpitations.  Gastrointestinal: Positive for diarrhea. Negative for abdominal pain, blood in stool, constipation, nausea and vomiting.  Genitourinary: Negative for hematuria.   Skin: Negative.   Neurological: Positive for headaches and numbness. Negative for dizziness and light-headedness.  Hematological: Does not bruise/bleed  easily.      PAST MEDICAL/SURGICAL HISTORY:  Past Medical History:  Diagnosis Date  . Asthma   . Broken toe   . Carpal tunnel syndrome   . Iron deficiency anemia   . Migraine   . Near syncope Episodic near-syncope  . Obesity, unspecified    Obesity  . Vertigo   . Vitamin D deficiency    Past Surgical History:  Procedure Laterality Date  . TONSILLECTOMY    . WISDOM TOOTH EXTRACTION Bilateral      SOCIAL HISTORY:  Social History   Socioeconomic History  . Marital status: Married    Spouse name: Not on file  . Number of children: 0  . Years of education: some college  . Highest education level: Not on file  Occupational History  . Occupation: Unemployed  Tobacco Use  . Smoking status: Never Smoker  . Smokeless tobacco: Never Used  Vaping Use  . Vaping Use: Never used  Substance and Sexual Activity  . Alcohol use: No  . Drug use: No  . Sexual activity: Yes    Birth control/protection: Pill  Other Topics Concern  . Not on file  Social History Narrative   Lives at home with spouse.   Right-handed.    Occasional caffeine use.   Social Determinants of Health   Financial Resource Strain: Not on file  Food Insecurity: Not on file  Transportation Needs: Not on file  Physical Activity: Not on file  Stress: Not on file  Social Connections: Not on file  Intimate Partner Violence: Not on file    FAMILY HISTORY:  Family History  Problem Relation Age of Onset  . Deafness Mother   .  Diabetes Mother   . Diabetes Maternal Grandmother   . Brain cancer Maternal Grandmother   . Diabetes Maternal Grandfather   . Multiple sclerosis Other   . Lupus Other     CURRENT MEDICATIONS:  Outpatient Encounter Medications as of 12/02/2020  Medication Sig  . albuterol (PROVENTIL HFA;VENTOLIN HFA) 108 (90 BASE) MCG/ACT inhaler Inhale 2 puffs into the lungs every 6 (six) hours as needed for wheezing or shortness of breath.   . cephALEXin (KEFLEX) 500 MG capsule Take 1 capsule  (500 mg total) by mouth 3 (three) times daily.  . cetirizine (ZYRTEC) 10 MG chewable tablet Chew 10 mg by mouth daily.  . cholecalciferol (VITAMIN D3) 25 MCG (1000 UNIT) tablet Take 1,000 Units by mouth daily. Pt takes 2,000 units daily  . ferrous sulfate 325 (65 FE) MG tablet Take 325 mg by mouth daily with breakfast.  . guaiFENesin (MUCINEX) 600 MG 12 hr tablet Take 600 mg by mouth 2 (two) times daily.  . hyoscyamine (LEVSIN) 0.125 MG tablet TAKE 1 TABLET BY MOUTH EVERY 6 HOURS AS NEEDED FOR DIARRHEA  . ibuprofen (ADVIL,MOTRIN) 800 MG tablet Take 800 mg by mouth every 8 (eight) hours as needed.  . JENCYCLA 0.35 MG tablet Take 1 tablet by mouth once daily  . magnesium oxide (MAG-OX) 400 MG tablet Take 400 mg by mouth 2 (two) times daily.  . Potassium 99 MG TABS Take by mouth at bedtime.   . Riboflavin (VITAMIN B2 PO) Take by mouth 2 (two) times daily.   . SUMAtriptan (IMITREX) 50 MG tablet Take 1 tablet (50 mg total) by mouth every 2 (two) hours as needed for migraine. May repeat in 2 hours if headache persists or recurs.  . topiramate (TOPAMAX) 100 MG tablet Take 1 tablet (100 mg total) by mouth 2 (two) times daily.  . Vitamin D, Ergocalciferol, (DRISDOL) 1.25 MG (50000 UNIT) CAPS capsule Take 1 capsule (50,000 Units total) by mouth once a week.   No facility-administered encounter medications on file as of 12/02/2020.    ALLERGIES:  Allergies  Allergen Reactions  . Penicillins   . Desipramine Rash     PHYSICAL EXAM:  ECOG PERFORMANCE STATUS: 1 - Symptomatic but completely ambulatory  There were no vitals filed for this visit. There were no vitals filed for this visit. Physical Exam Constitutional:      Appearance: Normal appearance.     Comments: Morbidly obese body habitus  HENT:     Head: Normocephalic and atraumatic.     Mouth/Throat:     Mouth: Mucous membranes are moist.  Eyes:     Extraocular Movements: Extraocular movements intact.     Pupils: Pupils are equal,  round, and reactive to light.  Cardiovascular:     Rate and Rhythm: Normal rate and regular rhythm.     Pulses: Normal pulses.     Heart sounds: Normal heart sounds.  Pulmonary:     Effort: Pulmonary effort is normal.     Breath sounds: Normal breath sounds.  Abdominal:     General: Bowel sounds are normal.     Palpations: Abdomen is soft.     Tenderness: There is no abdominal tenderness.  Musculoskeletal:        General: No swelling.     Right lower leg: No edema.     Left lower leg: No edema.  Lymphadenopathy:     Cervical: No cervical adenopathy.  Skin:    General: Skin is warm and dry.  Neurological:  General: No focal deficit present.     Mental Status: She is alert and oriented to person, place, and time.  Psychiatric:        Mood and Affect: Mood normal.        Behavior: Behavior normal.      LABORATORY DATA:  I have reviewed the labs as listed.  CBC    Component Value Date/Time   WBC 10.4 11/25/2020 1415   RBC 4.81 11/25/2020 1415   HGB 13.8 11/25/2020 1415   HGB 13.2 11/07/2019 0821   HCT 43.7 11/25/2020 1415   HCT 40.5 11/07/2019 0821   PLT 460 (H) 11/25/2020 1415   PLT 455 (H) 11/07/2019 0821   MCV 90.9 11/25/2020 1415   MCV 87 11/07/2019 0821   MCH 28.7 11/25/2020 1415   MCHC 31.6 11/25/2020 1415   RDW 13.7 11/25/2020 1415   RDW 12.7 11/07/2019 0821   LYMPHSABS 2.3 11/25/2020 1415   LYMPHSABS 3.4 (H) 11/07/2019 0821   MONOABS 0.8 11/25/2020 1415   EOSABS 0.1 11/25/2020 1415   EOSABS 0.2 11/07/2019 0821   BASOSABS 0.1 11/25/2020 1415   BASOSABS 0.1 11/07/2019 0821   CMP Latest Ref Rng & Units 10/27/2019 04/15/2019 09/05/2018  Glucose 70 - 99 mg/dL 105(H) 108(H) 88  BUN 6 - 20 mg/dL $Remove'10 12 13  'MAyzEWP$ Creatinine 0.44 - 1.00 mg/dL 0.77 0.87 0.76  Sodium 135 - 145 mmol/L 138 138 134(L)  Potassium 3.5 - 5.1 mmol/L 3.7 3.9 3.7  Chloride 98 - 111 mmol/L 106 106 107  CO2 22 - 32 mmol/L 23 24 19(L)  Calcium 8.9 - 10.3 mg/dL 9.2 9.7 9.0  Total Protein 6.5 -  8.1 g/dL 7.2 7.6 7.4  Total Bilirubin 0.3 - 1.2 mg/dL 0.5 0.3 0.5  Alkaline Phos 38 - 126 U/L 61 63 77  AST 15 - 41 U/L 17 21 32  ALT 0 - 44 U/L 26 37 47(H)    DIAGNOSTIC IMAGING:  I have independently reviewed the relevant imaging and discussed with the patient.  ASSESSMENT: 1.  Leukocytosis and thrombocytosis: -She was found to have elevated white count and platelet count (normal hemoglobin) since 2013. -She is a never smoker.  No prior history of splenectomy.  No history of steroid use.  No known connective tissue disease or rheumatologic disorder. -Testing for myeloproliferative disorders was negative (negative BCR/ABL, JAK2 V617F, JAK2 exon 12-15, CALR, MPL) -ANA was mildly positive with 1 in 160 dilution.  She does not have any clinical signs or symptoms of lupus. -Her white count has occasionally normalized. - Reviewed labs from (11/25/2020): WBC normalized at 10.4 with normal differential; platelets elevated at 460; LDH normal at 150 -Differential diagnosis includes reactive or obesity-related blood abnormalities, but cannot exclude early myeloproliferative disorder   PLAN:  1.  Leukocytosis and thrombocytosis: - Reviewed labs from (11/25/2020): WBC normalized at 10.4 with normal differential; platelets elevated at 460; LDH normal at 150 -Differential diagnosis includes reactive or obesity-related blood abnormalities, but cannot exclude early myeloproliferative disorder -We will reevaluate her in 1 year with repeat blood counts and physical exam.  Will consider bone marrow biopsy if there is any significant changes.  2.  Vitamin D deficiency: -Last vitamin D in September 2020 was 29. -Vitamin D deficiency being managed by PCP  3.  Iron deficiency: -Last ferritin was 60 on 04/15/2019.  She will continue iron tablet daily.   PLAN SUMMARY & DISPOSITION: -Labs (CBC, LDH, ferritin/iron, B12, vitamin D) in 1 year - RTC in 1 year  All questions were answered. The patient knows to  call the clinic with any problems, questions or concerns.  Medical decision making: Low  Time spent on visit: I spent 15 minutes counseling the patient face to face. The total time spent in the appointment was 20 minutes and more than 50% was on counseling.   Harriett Rush, PA-C  12/02/20 12:28 PM

## 2020-12-02 NOTE — Patient Instructions (Addendum)
[place] at [facility] Discharge Instructions  You were seen today by Tarri Abernethy PA-C for your leukocytosis and thrombocytosis. Your white blood cell counts are normal at this time, and your platelets are mildly elevated.  This is most likely reactive related to obesity.    Please follow up with your primary care provider for ongoing management of your Vitamin D deficiency.  LABS: Return in 1 year for labs   OTHER TESTS: None  MEDICATIONS: No changes - follow up with PCP for Vitamin D  FOLLOW-UP APPOINTMENT: Follow up in 1 year   Thank you for choosing [place] at [facility] to provide your oncology and hematology care.  To afford each patient quality time with our provider, please arrive at least 15 minutes before your scheduled appointment time.   If you have a lab appointment with the [facility] please come in thru the Main Entrance and check in at the main information desk.  You need to re-schedule your appointment should you arrive 10 or more minutes late.  We strive to give you quality time with our providers, and arriving late affects you and other patients whose appointments are after yours.  Also, if you no show three or more times for appointments you may be dismissed from the clinic at the providers discretion.     Again, thank you for choosing [facility].  Our hope is that these requests will decrease the amount of time that you wait before being seen by our physicians.       _____________________________________________________________  Should you have questions after your visit to [facility], please contact our office at [phone] and follow the prompts.  Our office hours are 8:00 a.m. and 4:30 p.m. Monday - Friday.  Please note that voicemails left after 4:00 p.m. may not be returned until the following business day.  We are closed weekends and major holidays.  You do have access to  a nurse 24-7, just call the main number to the clinic [phone] and do not press any options, hold on the line and a nurse will answer the phone.    For prescription refill requests, have your pharmacy contact our office and allow 72 hours.    Due to Covid, you will need to wear a mask upon entering the hospital. If you do not have a mask, a mask will be given to you at the Main Entrance upon arrival. For doctor visits, patients may have 1 support person age 2 or older with them. For treatment visits, patients can not have anyone with them due to social distancing guidelines and our immunocompromised population.

## 2020-12-07 DIAGNOSIS — R5383 Other fatigue: Secondary | ICD-10-CM | POA: Diagnosis not present

## 2020-12-08 ENCOUNTER — Ambulatory Visit (INDEPENDENT_AMBULATORY_CARE_PROVIDER_SITE_OTHER): Payer: BC Managed Care – PPO | Admitting: Psychology

## 2020-12-08 DIAGNOSIS — F064 Anxiety disorder due to known physiological condition: Secondary | ICD-10-CM

## 2020-12-21 ENCOUNTER — Ambulatory Visit (INDEPENDENT_AMBULATORY_CARE_PROVIDER_SITE_OTHER): Payer: BC Managed Care – PPO | Admitting: Neurology

## 2020-12-21 ENCOUNTER — Encounter: Payer: Self-pay | Admitting: Neurology

## 2020-12-21 VITALS — BP 128/78 | HR 68 | Ht 65.0 in | Wt 313.0 lb

## 2020-12-21 DIAGNOSIS — G43109 Migraine with aura, not intractable, without status migrainosus: Secondary | ICD-10-CM | POA: Diagnosis not present

## 2020-12-21 MED ORDER — TOPIRAMATE 100 MG PO TABS: 100.0000 mg | ORAL_TABLET | Freq: Two times a day (BID) | ORAL | 3 refills | Status: DC

## 2020-12-21 MED ORDER — SUMATRIPTAN SUCCINATE 50 MG PO TABS: 50.0000 mg | ORAL_TABLET | ORAL | 11 refills | Status: DC | PRN

## 2020-12-21 NOTE — Progress Notes (Signed)
PATIENT: Shannon Brooks DOB: May 18, 1986  REASON FOR VISIT: follow up HISTORY FROM: patient  HISTORY OF PRESENT ILLNESS: Today 12/21/20  HISTORY Shannon Brooks is a 35 years old female, seen in refer by his primary care doctor Celene Squibb for evaluation of dizziness, initial evaluation was on April 10 2017.  I reviewed and summarized the referring note, she had a past medical history of chronic migraine, obese, presented with frequent vertigo with associated headache since 2017   She had migraine headaches since 35 years old, her typical migraine are lateralized severe pounding headache with associated light noise sensitivity, nauseous, lasting for a few hours, was helped by caffeine, over-the-counter Excedrin Migraine, Tylenol ibuprofen as needed, sometimes her migraines are preceded by auras, visual distortion, dizziness,  She also complains of intermittent vertigo episodes since 2013, gradually getting worse especially since 2017, over the past few months, she has it almost on a weekly basis, sudden onset of unsteady gait, difficulty focusing, brain foggy sensation, lasting for few hours, caffeine usually helps her symptoms temporarily, oftentimes is followed by severe migraine headaches,  Stopping birth control in March 2018 seems to make her symptoms worse   Update July 10, 2017: She is now taking Topamax 100 mg twice a day, doing very well, lost 11 pounds over past 3 months, Imitrex as needed works well for her migraine headaches, and recurrent dizzy spells, continue have recurrent spells of lightheadedness, whole body tremor, without loss consciousness lasting for a few minutes, not necessarily associated with headaches,  UPDATE September 30 2018: She is over all happy with current migraine control, she is taking Topamax 200mg  daily, magnesium oxide, riboflavin 100mg  twice a day as preventive medications.    She has migraine once a week,  Sometimes precede by  dizziness, sleeping would help.    Imitrex 50mg  as needed was helpful.  She also came with a list of constellation of complaints, frequent body tremor, disturbance spells, staring at the computer screen,Watching TV, increased brain fog  EEG was normal in Dec 2018   Update Dec 17, 2019 SS: Overall happy with current migraine control, taking Topamax 100 mg twice a day, magnesium oxide, riboflavin for preventative medications.  Has headache with pain about once a month, will take Imitrex with good benefit.  Has daily dizziness/vertigo vision, "wake up on the boat", it may worsen throughout the day.  She carries a cane just in case.  She is now on disability.  She may have occasional sensation of cold or heat to crown of head during spells.  Daily dizzy spells have been chronic.  Here with husband, not planning to start a family currently.  Has close follow-up with PCP, found to have low vitamin D.  Topamax makes symptoms manageable.  Update Dec 21, 2020 SS: Sees hematology at [facility] for thrombocytosis, vitamin D level has been low, on prescription strength, did trial off Vitamin D, noticed significant worsening in memory, gained 13 lbs, was sleeping a lot. Is back on D2, feeling great now. Rare migraines, noted significant worsening in vertigo when off the D2. Only 4 Imitrex a year, rapid onset, notes worst with menstrual cycle and barometric pressure change. Has dizziness from minute she gets up, topamax "mutes it". Some visual hallucinations rare, could be migraine or side effect from Topamax? Doesn't drive further than 5 minutes from home. Also on magnesium/riboflavin. Has cane if needed.  REVIEW OF SYSTEMS: Out of a complete 14 system review of symptoms, the patient complains only of  the following symptoms, and all other reviewed systems are negative.  Headache   ALLERGIES: Allergies  Allergen Reactions  . Penicillins   . Desipramine Rash    HOME MEDICATIONS: Outpatient Medications Prior to  Visit  Medication Sig Dispense Refill  . albuterol (PROVENTIL HFA;VENTOLIN HFA) 108 (90 BASE) MCG/ACT inhaler Inhale 2 puffs into the lungs every 6 (six) hours as needed for wheezing or shortness of breath.     . cephALEXin (KEFLEX) 500 MG capsule Take 1 capsule (500 mg total) by mouth 3 (three) times daily. 21 capsule 0  . cetirizine (ZYRTEC) 10 MG chewable tablet Chew 10 mg by mouth daily.    . cholecalciferol (VITAMIN D3) 25 MCG (1000 UNIT) tablet Take 1,000 Units by mouth daily. Pt takes 2,000 units daily    . ergocalciferol (VITAMIN D2) 1.25 MG (50000 UT) capsule     . ferrous sulfate 325 (65 FE) MG tablet Take 325 mg by mouth daily with breakfast.    . guaiFENesin (MUCINEX) 600 MG 12 hr tablet Take 600 mg by mouth 2 (two) times daily.    . hyoscyamine (LEVSIN) 0.125 MG tablet TAKE 1 TABLET BY MOUTH EVERY 6 HOURS AS NEEDED FOR DIARRHEA    . ibuprofen (ADVIL,MOTRIN) 800 MG tablet Take 800 mg by mouth every 8 (eight) hours as needed.    . JENCYCLA 0.35 MG tablet Take 1 tablet by mouth once daily 168 tablet 3  . magnesium oxide (MAG-OX) 400 MG tablet Take 400 mg by mouth 2 (two) times daily.    . Potassium 99 MG TABS Take by mouth at bedtime.     . Riboflavin (VITAMIN B2 PO) Take by mouth 2 (two) times daily.     . SUMAtriptan (IMITREX) 50 MG tablet Take 1 tablet (50 mg total) by mouth every 2 (two) hours as needed for migraine. May repeat in 2 hours if headache persists or recurs. 12 tablet 11  . topiramate (TOPAMAX) 100 MG tablet Take 1 tablet (100 mg total) by mouth 2 (two) times daily. 180 tablet 3   No facility-administered medications prior to visit.    PAST MEDICAL HISTORY: Past Medical History:  Diagnosis Date  . Asthma   . Broken toe   . Carpal tunnel syndrome   . Iron deficiency anemia   . Migraine   . Near syncope Episodic near-syncope  . Obesity, unspecified    Obesity  . Vertigo   . Vitamin D deficiency     PAST SURGICAL HISTORY: Past Surgical History:  Procedure  Laterality Date  . TONSILLECTOMY    . WISDOM TOOTH EXTRACTION Bilateral     FAMILY HISTORY: Family History  Problem Relation Age of Onset  . Deafness Mother   . Diabetes Mother   . Diabetes Maternal Grandmother   . Brain cancer Maternal Grandmother   . Diabetes Maternal Grandfather   . Multiple sclerosis Other   . Lupus Other     SOCIAL HISTORY: Social History   Socioeconomic History  . Marital status: Married    Spouse name: Not on file  . Number of children: 0  . Years of education: some college  . Highest education level: Not on file  Occupational History  . Occupation: Unemployed  Tobacco Use  . Smoking status: Never Smoker  . Smokeless tobacco: Never Used  Vaping Use  . Vaping Use: Never used  Substance and Sexual Activity  . Alcohol use: No  . Drug use: No  . Sexual activity: Yes    Birth  control/protection: Pill  Other Topics Concern  . Not on file  Social History Narrative   Lives at home with spouse.   Right-handed.    Occasional caffeine use.   Social Determinants of Health   Financial Resource Strain: Not on file  Food Insecurity: Not on file  Transportation Needs: Not on file  Physical Activity: Not on file  Stress: Not on file  Social Connections: Not on file  Intimate Partner Violence: Not on file  PHYSICAL EXAM  Vitals:   12/21/20 1016  BP: 128/78  Pulse: 68  Weight: (!) 313 lb (142 kg)  Height: 5\' 5"  (1.651 m)   Body mass index is 52.09 kg/m.  Generalized: Well developed, in no acute distress , obese  Neurological examination  Mentation: Alert oriented to time, place, history taking. Follows all commands speech and language fluent Cranial nerve II-XII: Pupils were equal round reactive to light. Extraocular movements were full, visual field were full on confrontational test. Facial sensation and strength were normal. Head turning and shoulder shrug  were normal and symmetric. Motor: The motor testing reveals 5 over 5 strength of  all 4 extremities. Good symmetric motor tone is noted throughout.  Sensory: Sensory testing is intact to soft touch on all 4 extremities. No evidence of extinction is noted.  Coordination: Cerebellar testing reveals good finger-nose-finger and heel-to-shin bilaterally.  Gait and station: Has to push off to stand, with gait, wide-based, cautious, mildly unsteady, uses quad cane Reflexes: Deep tendon reflexes are symmetric and normal bilaterally.   DIAGNOSTIC DATA (LABS, IMAGING, TESTING) - I reviewed patient records, labs, notes, testing and imaging myself where available.  Lab Results  Component Value Date   WBC 10.4 11/25/2020   HGB 13.8 11/25/2020   HCT 43.7 11/25/2020   MCV 90.9 11/25/2020   PLT 460 (H) 11/25/2020      Component Value Date/Time   NA 138 10/27/2019 1035   K 3.7 10/27/2019 1035   CL 106 10/27/2019 1035   CO2 23 10/27/2019 1035   GLUCOSE 105 (H) 10/27/2019 1035   BUN 10 10/27/2019 1035   CREATININE 0.77 10/27/2019 1035   CALCIUM 9.2 10/27/2019 1035   PROT 7.2 10/27/2019 1035   ALBUMIN 3.9 10/27/2019 1035   AST 17 10/27/2019 1035   ALT 26 10/27/2019 1035   ALKPHOS 61 10/27/2019 1035   BILITOT 0.5 10/27/2019 1035   GFRNONAA >60 10/27/2019 1035   GFRAA >60 10/27/2019 1035   No results found for: CHOL, HDL, LDLCALC, LDLDIRECT, TRIG, CHOLHDL No results found for: HGBA1C Lab Results  Component Value Date   VITAMINB12 336 04/15/2019   Lab Results  Component Value Date   TSH 1.410 11/07/2019    ASSESSMENT AND PLAN 35 y.o. year old female  has a past medical history of Asthma, Broken toe, Carpal tunnel syndrome, Iron deficiency anemia, Migraine, Near syncope (Episodic near-syncope), Obesity, unspecified, Vertigo, and Vitamin D deficiency. here with:  1.  Complicated migraine headache -Continues to be overall stable, happy with Topamax -Continue Topamax 100 mg twice daily for prevention -Continue Imitrex 50 mg as needed for acute headache -Also on  magnesium and riboflavin -Follows with hematology for thrombocytosis, PCP, has been found to have low vitamin D, on prescription strength -Follow-up in 1 year or sooner if needed  Shannon Dakin, DNP 12/21/2020, 10:54 AM [facility] [address], [place], [place] [address] [phone]

## 2020-12-21 NOTE — Patient Instructions (Signed)
Continue current medications  See you back in 1 year

## 2020-12-22 ENCOUNTER — Ambulatory Visit: Payer: Medicare Other | Admitting: Psychology

## 2021-01-05 ENCOUNTER — Ambulatory Visit (INDEPENDENT_AMBULATORY_CARE_PROVIDER_SITE_OTHER): Payer: BC Managed Care – PPO | Admitting: Psychology

## 2021-01-05 DIAGNOSIS — F064 Anxiety disorder due to known physiological condition: Secondary | ICD-10-CM | POA: Diagnosis not present

## 2021-01-19 ENCOUNTER — Ambulatory Visit (INDEPENDENT_AMBULATORY_CARE_PROVIDER_SITE_OTHER): Payer: BC Managed Care – PPO | Admitting: Psychology

## 2021-01-19 DIAGNOSIS — F064 Anxiety disorder due to known physiological condition: Secondary | ICD-10-CM | POA: Diagnosis not present

## 2021-02-02 ENCOUNTER — Ambulatory Visit: Payer: Medicare Other | Admitting: Psychology

## 2021-02-08 IMAGING — MG DIGITAL DIAGNOSTIC BILAT W/ TOMO W/ CAD
6 of 10 series · 6 of 30 positions shown · non-contrast
Comparison: None.

CLINICAL DATA: 34-year-old female presenting with right nipple
discharge that is clear/white only when expressed. Patient has
completed a course of antibiotics and the discharge persists. No
family history of breast cancer.

EXAM:
DIGITAL DIAGNOSTIC BILATERAL MAMMOGRAM WITH CAD AND TOMO
ULTRASOUND RIGHT BREAST

[R CC synth-2D (1 of 2)]
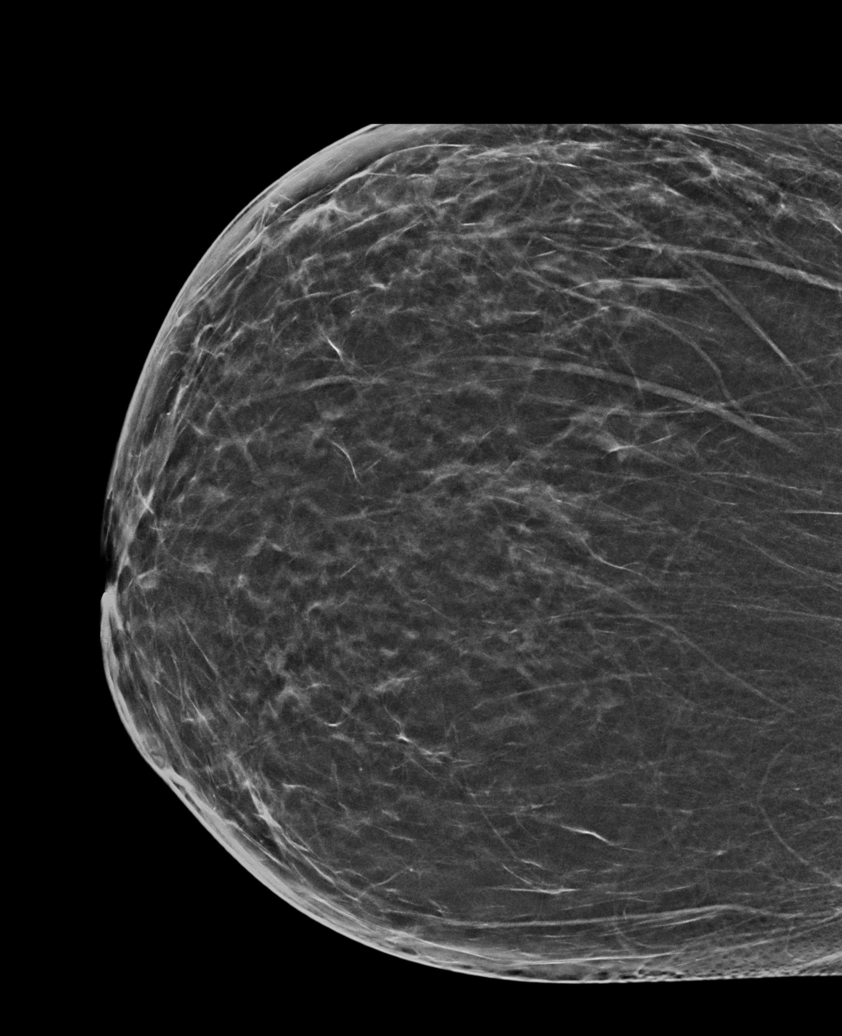

[L CC synth-2D]
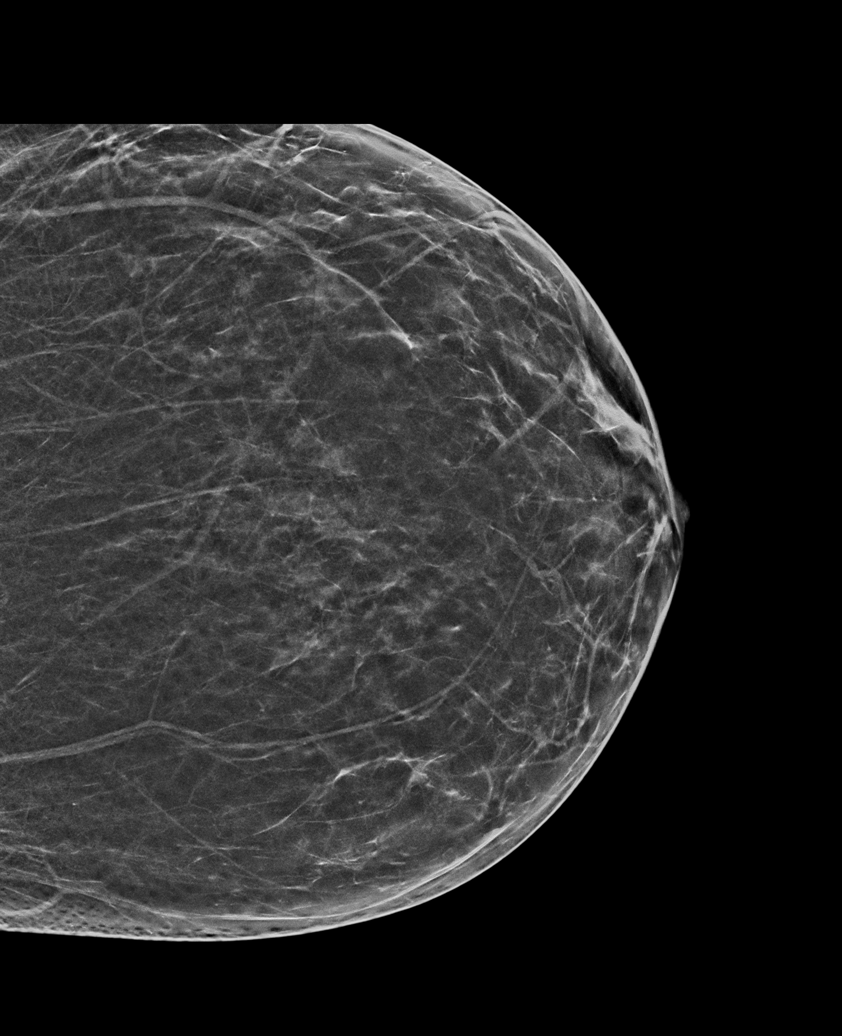

[R CC synth-2D (2 of 2)]
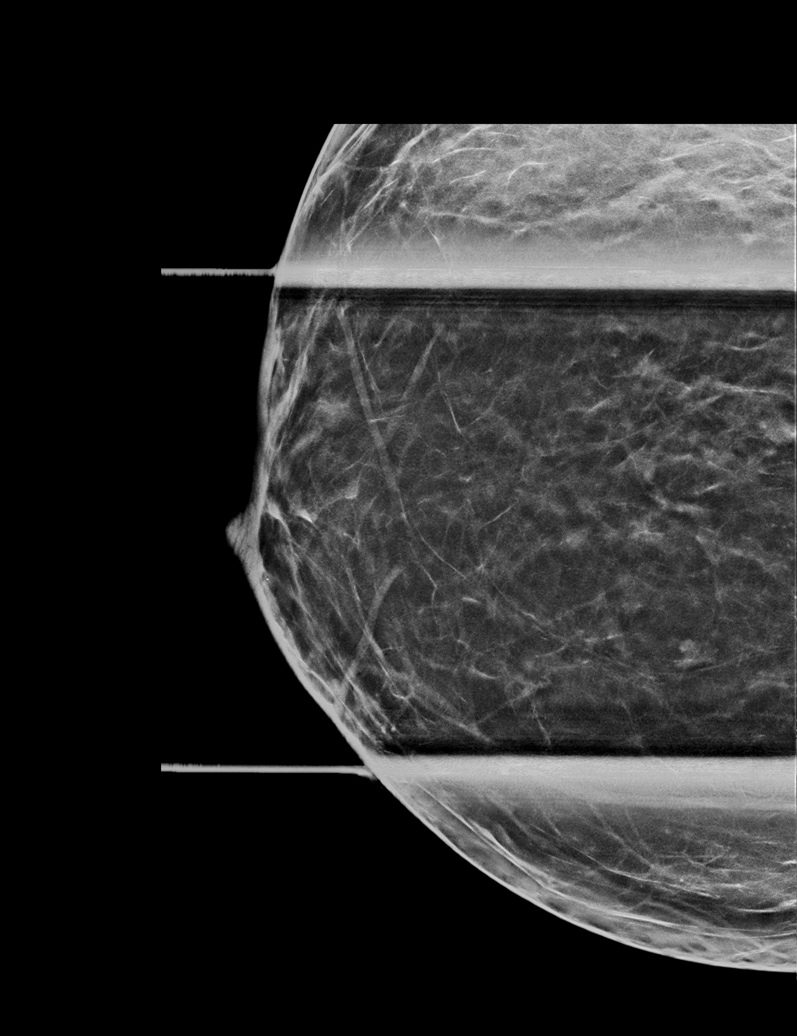

[R MLO synth-2D]
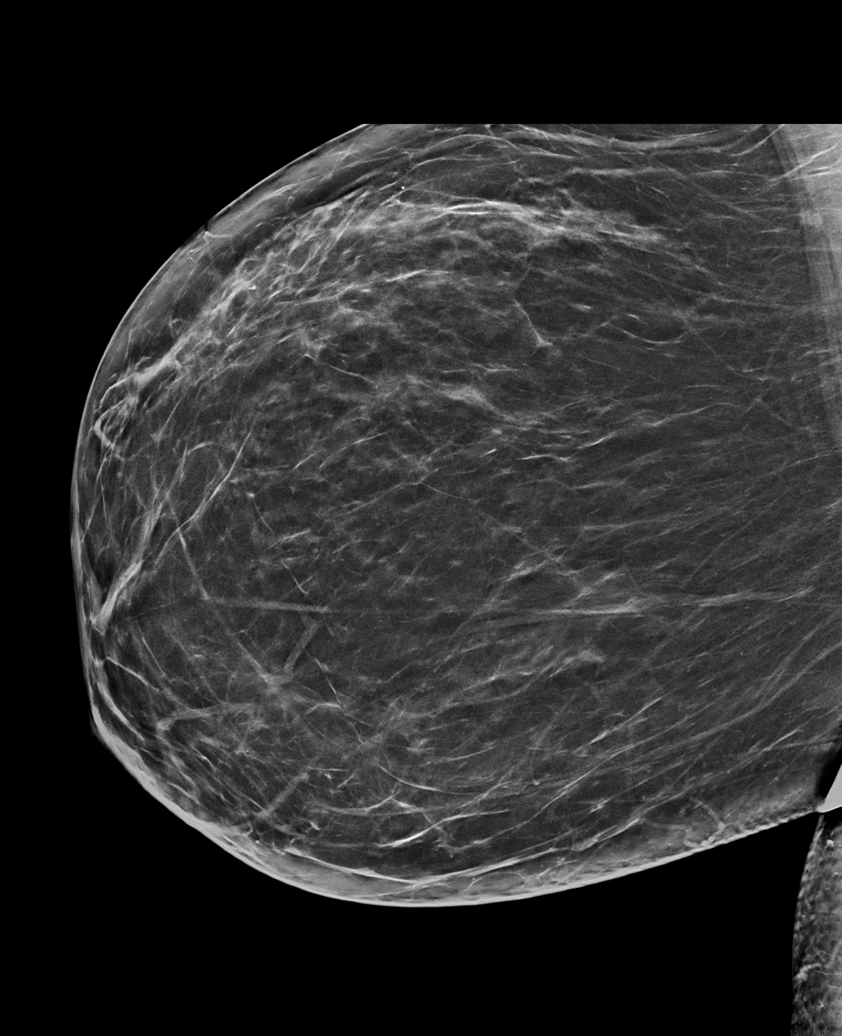

[L MLO synth-2D]
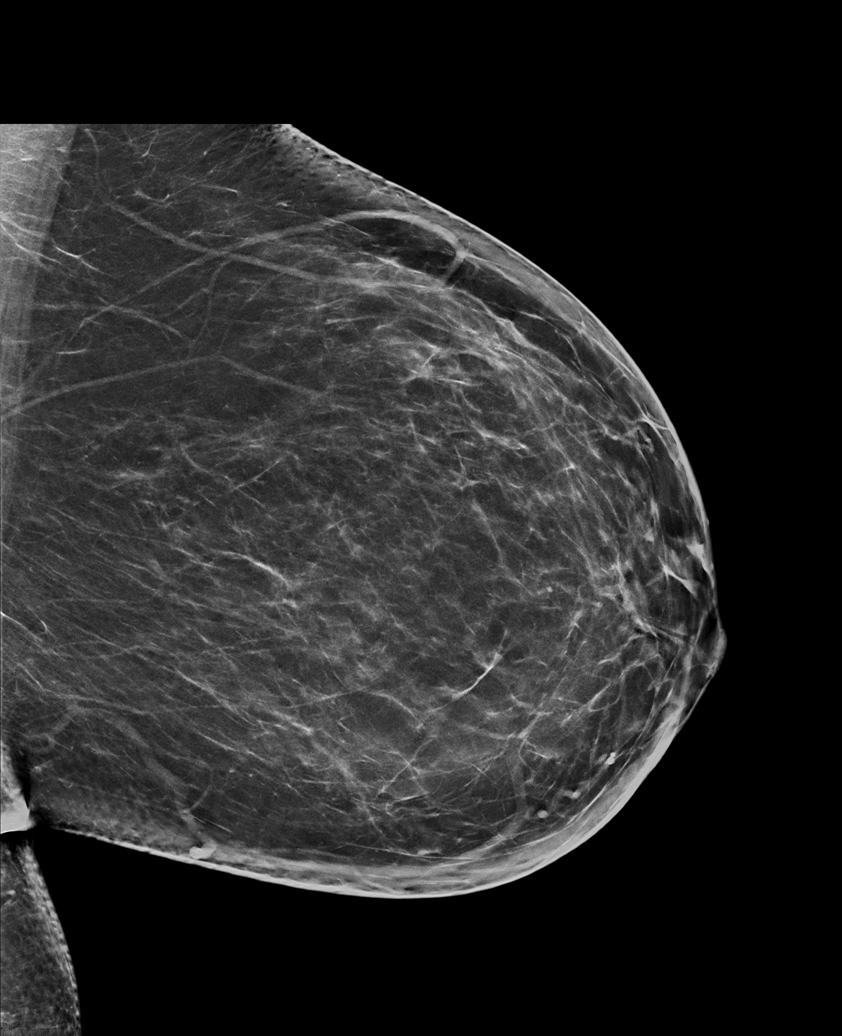

[R CC tomo · tomo slice 33/66.0]
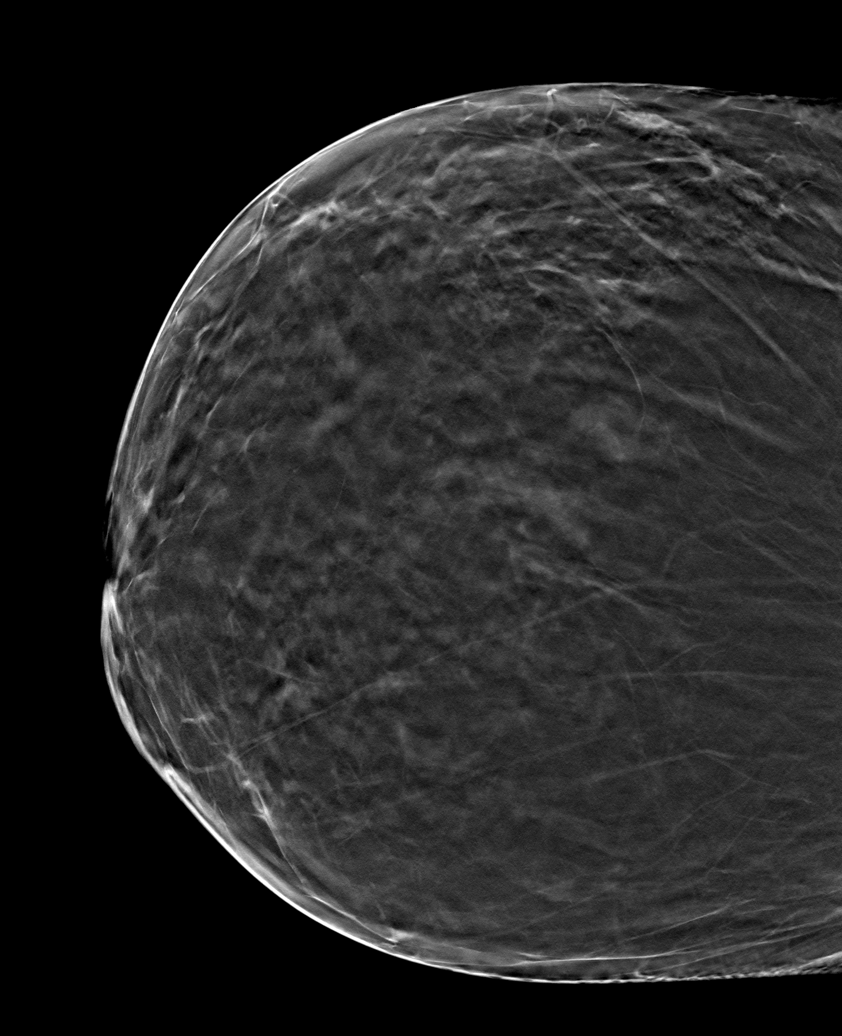

[6 of 30 positions shown; findings below may reference images not displayed]

ACR Breast Density Category b: There are scattered areas of
fibroglandular density.
FINDINGS: Mammogram:

Right breast: Spot compression tomosynthesis views of the
retroareolar right breast were performed in addition to the full
field standard views. No suspicious mass, distortion, or
microcalcifications are identified to suggest presence of
malignancy.

Left breast: No suspicious mass, distortion, or microcalcifications
are identified to suggest presence of malignancy.

Mammographic images were processed with CAD.

Ultrasound:

Targeted ultrasound is performed in the right retroareolar breast
demonstrating no suspicious cystic or solid mass. No dilated duct or
focal fluid collection. Normal skin thickness and sonographic
appearance of the nipple.
IMPRESSION: No mammographic or sonographic evidence of malignancy in the right
breast or other finding to explain the patient's nipple discharge
which is likely physiologic. No mammographic evidence of malignancy
in the left breast.

RECOMMENDATION:
1. Clinical follow-up as needed for the right nipple discharge.
Patient was counseled for features of nipple discharge that would be
concerning including bloody or spontaneous discharge.

2.  Begin annual screening mammography at age 40.

I have discussed the findings and recommendations with the patient.
If applicable, a reminder letter will be sent to the patient
regarding the next appointment.

BI-RADS CATEGORY  1: Negative.

## 2021-02-16 ENCOUNTER — Ambulatory Visit: Payer: Medicare Other | Admitting: Psychology

## 2021-02-24 ENCOUNTER — Encounter: Payer: Self-pay | Admitting: Advanced Practice Midwife

## 2021-02-24 ENCOUNTER — Other Ambulatory Visit (HOSPITAL_COMMUNITY)
Admission: RE | Admit: 2021-02-24 | Discharge: 2021-02-24 | Disposition: A | Discharge status: Home / Self Care | Payer: BC Managed Care – PPO | Source: Ambulatory Visit | Attending: Advanced Practice Midwife | Admitting: Advanced Practice Midwife

## 2021-02-24 ENCOUNTER — Ambulatory Visit (INDEPENDENT_AMBULATORY_CARE_PROVIDER_SITE_OTHER): Payer: BC Managed Care – PPO | Admitting: Advanced Practice Midwife

## 2021-02-24 ENCOUNTER — Other Ambulatory Visit: Payer: Self-pay

## 2021-02-24 VITALS — BP 135/85 | HR 78 | Ht 65.0 in | Wt 314.0 lb

## 2021-02-24 DIAGNOSIS — Z1151 Encounter for screening for human papillomavirus (HPV): Secondary | ICD-10-CM | POA: Diagnosis not present

## 2021-02-24 DIAGNOSIS — Z01419 Encounter for gynecological examination (general) (routine) without abnormal findings: Secondary | ICD-10-CM

## 2021-02-24 NOTE — Progress Notes (Signed)
Kaydyn Morozov 35 y.o.  Vitals:   02/24/21 1420  BP: 135/85  Pulse: 78     Filed Weights   02/24/21 1420  Weight: (!) 314 lb (142.4 kg)    Past Medical History: Past Medical History:  Diagnosis Date   Asthma    Broken toe    Carpal tunnel syndrome    Iron deficiency anemia    Migraine    Near syncope Episodic near-syncope   Obesity, unspecified    Obesity   Vertigo    Vitamin D deficiency     Past Surgical History: Past Surgical History:  Procedure Laterality Date   TONSILLECTOMY     WISDOM TOOTH EXTRACTION Bilateral     Family History: Family History  Problem Relation Age of Onset   Deafness Mother    Diabetes Mother    Diabetes Maternal Grandmother    Brain cancer Maternal Grandmother    Diabetes Maternal Grandfather    Multiple sclerosis Other    Lupus Other     Social History: Social History   Tobacco Use   Smoking status: Never   Smokeless tobacco: Never  Vaping Use   Vaping Use: Never used  Substance Use Topics   Alcohol use: No   Drug use: No    Allergies:  Allergies  Allergen Reactions   Penicillins    Desipramine Rash      Current Outpatient Medications:    albuterol (PROVENTIL HFA;VENTOLIN HFA) 108 (90 BASE) MCG/ACT inhaler, Inhale 2 puffs into the lungs every 6 (six) hours as needed for wheezing or shortness of breath. , Disp: , Rfl:    cetirizine (ZYRTEC) 10 MG chewable tablet, Chew 10 mg by mouth daily., Disp: , Rfl:    cholecalciferol (VITAMIN D3) 25 MCG (1000 UNIT) tablet, Take 1,000 Units by mouth daily. Pt takes 2,000 units daily, Disp: , Rfl:    ergocalciferol (VITAMIN D2) 1.25 MG (50000 UT) capsule, , Disp: , Rfl:    ferrous sulfate 325 (65 FE) MG tablet, Take 325 mg by mouth daily with breakfast., Disp: , Rfl:    guaiFENesin (MUCINEX) 600 MG 12 hr tablet, Take 600 mg by mouth 2 (two) times daily., Disp: , Rfl:    hyoscyamine (LEVSIN) 0.125 MG tablet, TAKE 1 TABLET BY MOUTH EVERY 6 HOURS AS NEEDED FOR DIARRHEA, Disp: ,  Rfl:    ibuprofen (ADVIL,MOTRIN) 800 MG tablet, Take 800 mg by mouth every 8 (eight) hours as needed., Disp: , Rfl:    JENCYCLA 0.35 MG tablet, Take 1 tablet by mouth once daily, Disp: 168 tablet, Rfl: 3   magnesium oxide (MAG-OX) 400 MG tablet, Take 400 mg by mouth 2 (two) times daily., Disp: , Rfl:    Phenylephrine HCl 5 MG TABS, Take by mouth., Disp: , Rfl:    Potassium 99 MG TABS, Take by mouth at bedtime. , Disp: , Rfl:    Riboflavin (VITAMIN B2 PO), Take by mouth 2 (two) times daily. , Disp: , Rfl:    SUMAtriptan (IMITREX) 50 MG tablet, Take 1 tablet (50 mg total) by mouth every 2 (two) hours as needed for migraine. May repeat in 2 hours if headache persists or recurs., Disp: 12 tablet, Rfl: 11   topiramate (TOPAMAX) 100 MG tablet, Take 1 tablet (100 mg total) by mouth 2 (two) times daily., Disp: 180 tablet, Rfl: 3  History of Present Illness: Here for pap and physical. Last pap 06/2017, normal.  On POPs (migraine w/aura). Wants BTL, but plans to stay on POPs to  avoid migraines  Has PCP who watches labs and a hematologist that is monitoring her WBCs.     Review of Systems   Patient denies any headaches, blurred vision, shortness of breath, chest pain, abdominal pain, problems with bowel movements, urination, or intercourse.   Physical Exam: General:  Well developed, well nourished, no acute distress Skin:  Warm and dry Neck:  Midline trachea, normal thyroid Lungs; Clear to auscultation bilaterally Breast:  No dominant palpable mass, retraction, or spontaneous nipple discharge (still can express clear dc if she tries).  Cardiovascular: Regular rate and rhythm Abdomen:  Soft, non tender, no hepatosplenomegaly Pelvic:  External genitalia is normal in appearance.  The vagina is normal in appearance.  The cervix is bulbous.  Uterus is felt to be normal size, shape, and contour.  No adnexal masses or tenderness noted. Exm limited by habitus Extremities:  No swelling or varicosities  noted Psych:  No mood changes.     Impression: normal GYN exam     Plan: wants BTL, will schedule.  Continue POPs for migraine control,

## 2021-03-01 LAB — CYTOLOGY - PAP
Adequacy: ABSENT
Chlamydia: NEGATIVE
Comment: NEGATIVE
Comment: NEGATIVE
Comment: NORMAL
Diagnosis: NEGATIVE
Diagnosis: REACTIVE
High risk HPV: NEGATIVE
Neisseria Gonorrhea: NEGATIVE

## 2021-03-15 ENCOUNTER — Encounter: Payer: Self-pay | Admitting: Obstetrics & Gynecology

## 2021-03-15 ENCOUNTER — Other Ambulatory Visit: Payer: Self-pay

## 2021-03-15 ENCOUNTER — Ambulatory Visit (INDEPENDENT_AMBULATORY_CARE_PROVIDER_SITE_OTHER): Payer: BC Managed Care – PPO | Admitting: Obstetrics & Gynecology

## 2021-03-15 VITALS — BP 125/81 | HR 73 | Ht 65.0 in | Wt 319.0 lb

## 2021-03-15 DIAGNOSIS — Z3009 Encounter for other general counseling and advice on contraception: Secondary | ICD-10-CM | POA: Diagnosis not present

## 2021-03-15 NOTE — Progress Notes (Signed)
Chief Complaint  Patient presents with   discuss getting a tubal      35 y.o. G0P0000 Patient's last menstrual period was 03/11/2021 (approximate). The current method of family planning is oral progesterone-only contraceptive.  Outpatient Encounter Medications as of 03/15/2021  Medication Sig   albuterol (PROVENTIL HFA;VENTOLIN HFA) 108 (90 BASE) MCG/ACT inhaler Inhale 2 puffs into the lungs every 6 (six) hours as needed for wheezing or shortness of breath.    cetirizine (ZYRTEC) 10 MG chewable tablet Chew 10 mg by mouth daily.   cholecalciferol (VITAMIN D3) 25 MCG (1000 UNIT) tablet Take 1,000 Units by mouth daily. Pt takes 2,000 units daily   ergocalciferol (VITAMIN D2) 1.25 MG (50000 UT) capsule    ferrous sulfate 325 (65 FE) MG tablet Take 325 mg by mouth daily with breakfast.   guaiFENesin (MUCINEX) 600 MG 12 hr tablet Take 600 mg by mouth 2 (two) times daily.   hyoscyamine (LEVSIN) 0.125 MG tablet TAKE 1 TABLET BY MOUTH EVERY 6 HOURS AS NEEDED FOR DIARRHEA   ibuprofen (ADVIL,MOTRIN) 800 MG tablet Take 800 mg by mouth every 8 (eight) hours as needed.   JENCYCLA 0.35 MG tablet Take 1 tablet by mouth once daily   magnesium oxide (MAG-OX) 400 MG tablet Take 400 mg by mouth 2 (two) times daily.   Phenylephrine HCl 5 MG TABS Take by mouth.   Potassium 99 MG TABS Take by mouth at bedtime.    Riboflavin (VITAMIN B2 PO) Take by mouth 2 (two) times daily.    SUMAtriptan (IMITREX) 50 MG tablet Take 1 tablet (50 mg total) by mouth every 2 (two) hours as needed for migraine. May repeat in 2 hours if headache persists or recurs.   topiramate (TOPAMAX) 100 MG tablet Take 1 tablet (100 mg total) by mouth 2 (two) times daily.   No facility-administered encounter medications on file as of 03/15/2021.    Subjective Pt in to discuss her BCM options Discussed in full as she has with Manus Gunning she opts for BTL Past Medical History:  Diagnosis Date   Asthma    Broken toe    Carpal tunnel  syndrome    Iron deficiency anemia    Migraine    Near syncope Episodic near-syncope   Obesity, unspecified    Obesity   Vertigo    Vitamin D deficiency     Past Surgical History:  Procedure Laterality Date   TONSILLECTOMY     WISDOM TOOTH EXTRACTION Bilateral     OB History     Gravida  0   Para  0   Term  0   Preterm  0   AB  0   Living  0      SAB  0   IAB  0   Ectopic  0   Multiple  0   Live Births  0           Allergies  Allergen Reactions   Penicillins    Desipramine Rash    Social History   Socioeconomic History   Marital status: Married    Spouse name: Not on file   Number of children: 0   Years of education: some college   Highest education level: Not on file  Occupational History   Occupation: Unemployed  Tobacco Use   Smoking status: Never   Smokeless tobacco: Never  Vaping Use   Vaping Use: Never used  Substance and Sexual Activity   Alcohol use: No  Drug use: No   Sexual activity: Yes    Birth control/protection: Pill  Other Topics Concern   Not on file  Social History Narrative   Lives at home with spouse.   Right-handed.    Occasional caffeine use.   Social Determinants of Health   Financial Resource Strain: Low Risk    Difficulty of Paying Living Expenses: Not hard at all  Food Insecurity: No Food Insecurity   Worried About Charity fundraiser in the Last Year: Never true   [place] in the Last Year: Never true  Transportation Needs: No Transportation Needs   Lack of Transportation (Medical): No   Lack of Transportation (Non-Medical): No  Physical Activity: Insufficiently Active   Days of Exercise per Week: 3 days   Minutes of Exercise per Session: 20 min  Stress: No Stress Concern Present   Feeling of Stress : Not at all  Social Connections: Moderately Isolated   Frequency of Communication with Friends and Family: More than three times a week   Frequency of Social Gatherings with Friends and  Family: Twice a week   Attends Religious Services: Never   Marine scientist or Organizations: No   Attends Music therapist: Never   Marital Status: Married    Family History  Problem Relation Age of Onset   Deafness Mother    Diabetes Mother    Diabetes Maternal Grandmother    Brain cancer Maternal Grandmother    Diabetes Maternal Grandfather    Multiple sclerosis Other    Lupus Other     Medications:       Current Outpatient Medications:    albuterol (PROVENTIL HFA;VENTOLIN HFA) 108 (90 BASE) MCG/ACT inhaler, Inhale 2 puffs into the lungs every 6 (six) hours as needed for wheezing or shortness of breath. , Disp: , Rfl:    cetirizine (ZYRTEC) 10 MG chewable tablet, Chew 10 mg by mouth daily., Disp: , Rfl:    cholecalciferol (VITAMIN D3) 25 MCG (1000 UNIT) tablet, Take 1,000 Units by mouth daily. Pt takes 2,000 units daily, Disp: , Rfl:    ergocalciferol (VITAMIN D2) 1.25 MG (50000 UT) capsule, , Disp: , Rfl:    ferrous sulfate 325 (65 FE) MG tablet, Take 325 mg by mouth daily with breakfast., Disp: , Rfl:    guaiFENesin (MUCINEX) 600 MG 12 hr tablet, Take 600 mg by mouth 2 (two) times daily., Disp: , Rfl:    hyoscyamine (LEVSIN) 0.125 MG tablet, TAKE 1 TABLET BY MOUTH EVERY 6 HOURS AS NEEDED FOR DIARRHEA, Disp: , Rfl:    ibuprofen (ADVIL,MOTRIN) 800 MG tablet, Take 800 mg by mouth every 8 (eight) hours as needed., Disp: , Rfl:    JENCYCLA 0.35 MG tablet, Take 1 tablet by mouth once daily, Disp: 168 tablet, Rfl: 3   magnesium oxide (MAG-OX) 400 MG tablet, Take 400 mg by mouth 2 (two) times daily., Disp: , Rfl:    Phenylephrine HCl 5 MG TABS, Take by mouth., Disp: , Rfl:    Potassium 99 MG TABS, Take by mouth at bedtime. , Disp: , Rfl:    Riboflavin (VITAMIN B2 PO), Take by mouth 2 (two) times daily. , Disp: , Rfl:    SUMAtriptan (IMITREX) 50 MG tablet, Take 1 tablet (50 mg total) by mouth every 2 (two) hours as needed for migraine. May repeat in 2 hours if headache  persists or recurs., Disp: 12 tablet, Rfl: 11   topiramate (TOPAMAX) 100 MG tablet, Take 1  tablet (100 mg total) by mouth 2 (two) times daily., Disp: 180 tablet, Rfl: 3  Objective Blood pressure 125/81, pulse 73, height '5\' 5"'$  (1.651 m), weight (!) 319 lb (144.7 kg), last menstrual period 03/11/2021.  Gen obese WF NAD  Pertinent ROS No burning with urination, frequency or urgency No nausea, vomiting or diarrhea Nor fever chills or other constitutional symptoms   Labs or studies     Impression Diagnoses this Encounter::   ICD-10-CM   1. Sterilization consult  Z30.09       Established relevant diagnosis(es):   Plan/Recommendations: No orders of the defined types were placed in this encounter.   Labs or Scans Ordered: No orders of the defined types were placed in this encounter.   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      All questions were answered.

## 2021-04-14 DIAGNOSIS — D509 Iron deficiency anemia, unspecified: Secondary | ICD-10-CM | POA: Diagnosis not present

## 2021-04-14 DIAGNOSIS — R7301 Impaired fasting glucose: Secondary | ICD-10-CM | POA: Diagnosis not present

## 2021-04-14 DIAGNOSIS — E782 Mixed hyperlipidemia: Secondary | ICD-10-CM | POA: Diagnosis not present

## 2021-04-19 DIAGNOSIS — D473 Essential (hemorrhagic) thrombocythemia: Secondary | ICD-10-CM | POA: Diagnosis not present

## 2021-04-19 DIAGNOSIS — E782 Mixed hyperlipidemia: Secondary | ICD-10-CM | POA: Diagnosis not present

## 2021-04-19 DIAGNOSIS — D509 Iron deficiency anemia, unspecified: Secondary | ICD-10-CM | POA: Diagnosis not present

## 2021-04-19 DIAGNOSIS — G43109 Migraine with aura, not intractable, without status migrainosus: Secondary | ICD-10-CM | POA: Diagnosis not present

## 2021-04-22 ENCOUNTER — Other Ambulatory Visit: Payer: Self-pay | Admitting: Obstetrics & Gynecology

## 2021-04-22 NOTE — Patient Instructions (Addendum)
Shannon Brooks  04/22/2021     @PREFPERIOPPHARMACY @   Your procedure is scheduled on  04/27/2021.   Report to [facility] at  0730 A.M.   Call this number if you have problems the morning of surgery:  [phone]   Remember:  Do not eat after midnight.   You may drink clear liquids until 0500 AM .  Clear liquids allowed are:                    Water, Juice (non-citric and without pulp - diabetics please choose diet or no sugar options), Carbonated beverages - (diabetics please choose diet or no sugar options), Clear Tea, Black Coffee only (no creamer, milk or cream including half and half), Plain Jell-O only (diabetics please choose diet or no sugar options), Gatorade (diabetics please choose diet or no sugar options), and Plain Popsicles only     At 0500 drink your carb drink and nothing else to drink after this.    Take these medicines the morning of surgery with A SIP OF WATER        zyrtec, chlor-trimeton, topamax, imitrex (if needed).     Do not wear jewelry, make-up or nail polish.  Do not wear lotions, powders, or perfumes, or deodorant.  Do not shave 48 hours prior to surgery.  Men may shave face and neck.  Do not bring valuables to the hospital.  [facility] is not responsible for any belongings or valuables.  Contacts, dentures or bridgework may not be worn into surgery.  Leave your suitcase in the car.  After surgery it may be brought to your room.  For patients admitted to the hospital, discharge time will be determined by your treatment team.  Patients discharged the day of surgery will not be allowed to drive home and must have someone with them for 24 hours.    Special instructions:   DO NOT smoke tobacco or vape for 24 hours before your procedure.  Please read over the following fact sheets that you were given. Coughing and Deep Breathing, Surgical Site Infection Prevention, Anesthesia Post-op Instructions, and Care and Recovery After  Surgery      Salpingectomy, Care After The following information offers guidance on how to care for yourself after your procedure. Your health care provider may also give you more specific instructions. If you have problems or questions, contact your health care provider. What can I expect after the procedure? After the procedure, it is common to have: Pain in your abdomen. Light vaginal bleeding (spotting) for a few days. Tiredness. Your recovery time will depend on which method was used for your surgery. Follow these instructions at home: Medicines Take over-the-counter and prescription medicines only as told by your health care provider. Ask your health care provider if the medicine prescribed to you: Requires you to avoid driving or using machinery. Can cause constipation. You may need to take actions to prevent or treat constipation, such as: Drink enough fluid to keep your urine pale yellow. Take over-the-counter or prescription medicines. Eat foods that are high in fiber, such as beans, whole grains, and fresh fruits and vegetables. Limit foods that are high in fat and processed sugars, such as fried or sweet foods. Incision care  Follow instructions from your health care provider about how to take care of your incision or incisions. Make sure you: Wash your hands with soap and water for at least 20 seconds before and  after you change your bandage (dressing). If soap and water are not available, use hand sanitizer. Change or remove your dressing as told by your health care provider. Leave stitches (sutures), skin glue, staples, or adhesive strips in place. These skin closures may need to stay in place for 2 weeks or longer. If adhesive strip edges start to loosen and curl up, you may trim the loose edges. Do not remove adhesive strips completely unless your health care provider tells you to do that. Keep your dressing clean and dry. Check your incision area every day for signs of  infection. Check for: Redness, swelling, or pain that gets worse. Fluid or blood. Warmth. Pus or a bad smell. Activity Rest as told by your health care provider. Avoid sitting for a long time without moving. Get up to take short walks every 1-2 hours. This is important to improve blood flow and breathing. Ask for help if you feel weak or unsteady. Return to your normal activities as told by your health care provider. Ask your health care provider what activities are safe for you. Do not drive until your health care provider says that it is safe. Do not lift anything that is heavier than 10 lb (4.5 kg), or the limit that you are told, until your health care provider says that it is safe. This may last for 2-6 weeks depending on your surgery. Do not douche, use tampons, or have sex until your health care provider approves. General instructions Do not use any products that contain nicotine or tobacco. These products include cigarettes, chewing tobacco, and vaping devices, such as e-cigarettes. These can delay healing after surgery. If you need help quitting, ask your health care provider. Wear compression stockings as told by your health care provider. These stockings help to prevent blood clots and reduce swelling in your legs. Do not take baths, swim, or use a hot tub until your health care provider approves. You may take showers. Keep all follow-up visits. This is important. Contact a health care provider if: You have pain when you urinate. You have redness, swelling, or more pain around an incision or an incision feels warm to the touch. You have pus, fluid, blood, or a bad smell coming from an incision or an incision starts to open. You have a fever. You have abdominal pain that gets worse or does not get better with medicine. You have a rash. You feel light-headed, have nausea and vomiting, or both. Get help right away if: You have pain in your chest or leg. You develop shortness of  breath. You faint. You have increased or heavy vaginal bleeding, such as soaking a sanitary napkin in an hour. These symptoms may represent a serious problem that is an emergency. Do not wait to see if the symptoms will go away. Get medical help right away. Call your local emergency services (911 in the [place] Do not drive yourself to the hospital. Summary After the procedure, it is common to feel tired, have pain in your abdomen, and have light vaginal bleeding for a few days. Follow instructions from your health care provider about how to take care of your incision or incisions. Return to your normal activities as told by your health care provider. Ask your health care provider what activities are safe for you. Do not douche, use tampons, or have sex until your health care provider approves. Keep all follow-up visits. This is important. This information is not intended to replace advice given to you by  your health care provider. Make sure you discuss any questions you have with your health care provider. Document Revised: 06/01/2020 Document Reviewed: 06/01/2020 Elsevier Patient Education  2022 [place] Anesthesia, Adult, Care After This sheet gives you information about how to care for yourself after your procedure. Your health care provider may also give you more specific instructions. If you have problems or questions, contact your health care provider. What can I expect after the procedure? After the procedure, the following side effects are common: Pain or discomfort at the IV site. Nausea. Vomiting. Sore throat. Trouble concentrating. Feeling cold or chills. Feeling weak or tired. Sleepiness and fatigue. Soreness and body aches. These side effects can affect parts of the body that were not involved in surgery. Follow these instructions at home: For the time period you were told by your health care provider:  Rest. Do not participate in activities where you could  fall or become injured. Do not drive or use machinery. Do not drink alcohol. Do not take sleeping pills or medicines that cause drowsiness. Do not make important decisions or sign legal documents. Do not take care of children on your own. Eating and drinking Follow any instructions from your health care provider about eating or drinking restrictions. When you feel hungry, start by eating small amounts of foods that are soft and easy to digest (bland), such as toast. Gradually return to your regular diet. Drink enough fluid to keep your urine pale yellow. If you vomit, rehydrate by drinking water, juice, or clear broth. General instructions If you have sleep apnea, surgery and certain medicines can increase your risk for breathing problems. Follow instructions from your health care provider about wearing your sleep device: Anytime you are sleeping, including during daytime naps. While taking prescription pain medicines, sleeping medicines, or medicines that make you drowsy. Have a responsible adult stay with you for the time you are told. It is important to have someone help care for you until you are awake and alert. Return to your normal activities as told by your health care provider. Ask your health care provider what activities are safe for you. Take over-the-counter and prescription medicines only as told by your health care provider. If you smoke, do not smoke without supervision. Keep all follow-up visits as told by your health care provider. This is important. Contact a health care provider if: You have nausea or vomiting that does not get better with medicine. You cannot eat or drink without vomiting. You have pain that does not get better with medicine. You are unable to pass urine. You develop a skin rash. You have a fever. You have redness around your IV site that gets worse. Get help right away if: You have difficulty breathing. You have chest pain. You have blood in your  urine or stool, or you vomit blood. Summary After the procedure, it is common to have a sore throat or nausea. It is also common to feel tired. Have a responsible adult stay with you for the time you are told. It is important to have someone help care for you until you are awake and alert. When you feel hungry, start by eating small amounts of foods that are soft and easy to digest (bland), such as toast. Gradually return to your regular diet. Drink enough fluid to keep your urine pale yellow. Return to your normal activities as told by your health care provider. Ask your health care provider what activities are safe for you. This information  is not intended to replace advice given to you by your health care provider. Make sure you discuss any questions you have with your health care provider. Document Revised: 03/25/2020 Document Reviewed: 10/23/2019 Elsevier Patient Education  2022 [place]. How to Use Chlorhexidine for Bathing Chlorhexidine gluconate (CHG) is a germ-killing (antiseptic) solution that is used to clean the skin. It can get rid of the bacteria that normally live on the skin and can keep them away for about 24 hours. To clean your skin with CHG, you may be given: A CHG solution to use in the shower or as part of a sponge bath. A prepackaged cloth that contains CHG. Cleaning your skin with CHG may help lower the risk for infection: While you are staying in the intensive care unit of the hospital. If you have a vascular access, such as a central line, to provide short-term or long-term access to your veins. If you have a catheter to drain urine from your bladder. If you are on a ventilator. A ventilator is a machine that helps you breathe by moving air in and out of your lungs. After surgery. What are the risks? Risks of using CHG include: A skin reaction. Hearing loss, if CHG gets in your ears and you have a perforated eardrum. Eye injury, if CHG gets in your eyes and is  not rinsed out. The CHG product catching fire. Make sure that you avoid smoking and flames after applying CHG to your skin. Do not use CHG: If you have a chlorhexidine allergy or have previously reacted to chlorhexidine. On babies younger than 74 months of age. How to use CHG solution Use CHG only as told by your health care provider, and follow the instructions on the label. Use the full amount of CHG as directed. Usually, this is one bottle. During a shower Follow these steps when using CHG solution during a shower (unless your health care provider gives you different instructions): Start the shower. Use your normal soap and shampoo to wash your face and hair. Turn off the shower or move out of the shower stream. Pour the CHG onto a clean washcloth. Do not use any type of brush or rough-edged sponge. Starting at your neck, lather your body down to your toes. Make sure you follow these instructions: If you will be having surgery, pay special attention to the part of your body where you will be having surgery. Scrub this area for at least 1 minute. Do not use CHG on your head or face. If the solution gets into your ears or eyes, rinse them well with water. Avoid your genital area. Avoid any areas of skin that have broken skin, cuts, or scrapes. Scrub your back and under your arms. Make sure to wash skin folds. Let the lather sit on your skin for 1-2 minutes or as long as told by your health care provider. Thoroughly rinse your entire body in the shower. Make sure that all body creases and crevices are rinsed well. Dry off with a clean towel. Do not put any substances on your body afterward--such as powder, lotion, or perfume--unless you are told to do so by your health care provider. Only use lotions that are recommended by the manufacturer. Put on clean clothes or pajamas. If it is the night before your surgery, sleep in clean sheets.  During a sponge bath Follow these steps when using  CHG solution during a sponge bath (unless your health care provider gives you different instructions): Use  your normal soap and shampoo to wash your face and hair. Pour the CHG onto a clean washcloth. Starting at your neck, lather your body down to your toes. Make sure you follow these instructions: If you will be having surgery, pay special attention to the part of your body where you will be having surgery. Scrub this area for at least 1 minute. Do not use CHG on your head or face. If the solution gets into your ears or eyes, rinse them well with water. Avoid your genital area. Avoid any areas of skin that have broken skin, cuts, or scrapes. Scrub your back and under your arms. Make sure to wash skin folds. Let the lather sit on your skin for 1-2 minutes or as long as told by your health care provider. Using a different clean, wet washcloth, thoroughly rinse your entire body. Make sure that all body creases and crevices are rinsed well. Dry off with a clean towel. Do not put any substances on your body afterward--such as powder, lotion, or perfume--unless you are told to do so by your health care provider. Only use lotions that are recommended by the manufacturer. Put on clean clothes or pajamas. If it is the night before your surgery, sleep in clean sheets. How to use CHG prepackaged cloths Only use CHG cloths as told by your health care provider, and follow the instructions on the label. Use the CHG cloth on clean, dry skin. Do not use the CHG cloth on your head or face unless your health care provider tells you to. When washing with the CHG cloth: Avoid your genital area. Avoid any areas of skin that have broken skin, cuts, or scrapes. Before surgery Follow these steps when using a CHG cloth to clean before surgery (unless your health care provider gives you different instructions): Using the CHG cloth, vigorously scrub the part of your body where you will be having surgery. Scrub using a  back-and-forth motion for 3 minutes. The area on your body should be completely wet with CHG when you are done scrubbing. Do not rinse. Discard the cloth and let the area air-dry. Do not put any substances on the area afterward, such as powder, lotion, or perfume. Put on clean clothes or pajamas. If it is the night before your surgery, sleep in clean sheets.  For general bathing Follow these steps when using CHG cloths for general bathing (unless your health care provider gives you different instructions). Use a separate CHG cloth for each area of your body. Make sure you wash between any folds of skin and between your fingers and toes. Wash your body in the following order, switching to a new cloth after each step: The front of your neck, shoulders, and chest. Both of your arms, under your arms, and your hands. Your stomach and groin area, avoiding the genitals. Your right leg and foot. Your left leg and foot. The back of your neck, your back, and your buttocks. Do not rinse. Discard the cloth and let the area air-dry. Do not put any substances on your body afterward--such as powder, lotion, or perfume--unless you are told to do so by your health care provider. Only use lotions that are recommended by the manufacturer. Put on clean clothes or pajamas. Contact a health care provider if: Your skin gets irritated after scrubbing. You have questions about using your solution or cloth. You swallow any chlorhexidine. Call your local poison control center (1-401 679 3125 in the [place] Get help right away if: Your  eyes itch badly, or they become very red or swollen. Your skin itches badly and is red or swollen. Your hearing changes. You have trouble seeing. You have swelling or tingling in your mouth or throat. You have trouble breathing. These symptoms may represent a serious problem that is an emergency. Do not wait to see if the symptoms will go away. Get medical help right away. Call your  local emergency services (911 in the [place] Do not drive yourself to the hospital. Summary Chlorhexidine gluconate (CHG) is a germ-killing (antiseptic) solution that is used to clean the skin. Cleaning your skin with CHG may help to lower your risk for infection. You may be given CHG to use for bathing. It may be in a bottle or in a prepackaged cloth to use on your skin. Carefully follow your health care provider's instructions and the instructions on the product label. Do not use CHG if you have a chlorhexidine allergy. Contact your health care provider if your skin gets irritated after scrubbing. This information is not intended to replace advice given to you by your health care provider. Make sure you discuss any questions you have with your health care provider. Document Revised: 09/20/2020 Document Reviewed: 09/20/2020 Elsevier Patient Education  2022 [employer].

## 2021-04-25 ENCOUNTER — Other Ambulatory Visit: Payer: Self-pay

## 2021-04-25 ENCOUNTER — Encounter (HOSPITAL_COMMUNITY)
Admission: RE | Admit: 2021-04-25 | Discharge: 2021-04-25 | Disposition: A | Discharge status: Home / Self Care | Payer: BC Managed Care – PPO | Source: Ambulatory Visit | Attending: Obstetrics & Gynecology | Admitting: Obstetrics & Gynecology

## 2021-04-25 ENCOUNTER — Encounter (HOSPITAL_COMMUNITY): Payer: Self-pay

## 2021-04-25 DIAGNOSIS — Z302 Encounter for sterilization: Secondary | ICD-10-CM | POA: Diagnosis not present

## 2021-04-25 DIAGNOSIS — Z880 Allergy status to penicillin: Secondary | ICD-10-CM | POA: Diagnosis not present

## 2021-04-25 DIAGNOSIS — Z6841 Body Mass Index (BMI) 40.0 and over, adult: Secondary | ICD-10-CM | POA: Diagnosis not present

## 2021-04-25 DIAGNOSIS — Z888 Allergy status to other drugs, medicaments and biological substances status: Secondary | ICD-10-CM | POA: Diagnosis not present

## 2021-04-25 DIAGNOSIS — Z01818 Encounter for other preprocedural examination: Secondary | ICD-10-CM | POA: Insufficient documentation

## 2021-04-25 LAB — COMPREHENSIVE METABOLIC PANEL
ALT: 23 U/L (ref 0–44)
AST: 22 U/L (ref 15–41)
Albumin: 4 g/dL (ref 3.5–5.0)
Alkaline Phosphatase: 62 U/L (ref 38–126)
Anion gap: 9 (ref 5–15)
BUN: 14 mg/dL (ref 6–20)
CO2: 21 mmol/L — ABNORMAL LOW (ref 22–32)
Calcium: 9.5 mg/dL (ref 8.9–10.3)
Chloride: 106 mmol/L (ref 98–111)
Creatinine, Ser: 0.66 mg/dL (ref 0.44–1.00)
GFR, Estimated: >60 mL/min (ref >60)
Glucose, Bld: 75 mg/dL (ref 70–99)
Potassium: 3.7 mmol/L (ref 3.5–5.1)
Sodium: 136 mmol/L (ref 135–145)
Total Bilirubin: 0.6 mg/dL (ref 0.3–1.2)
Total Protein: 7.5 g/dL (ref 6.5–8.1)

## 2021-04-25 LAB — CBC
HCT: 44.3 % (ref 36.0–46.0)
Hemoglobin: 13.9 g/dL (ref 12.0–15.0)
MCH: 28.5 pg (ref 26.0–34.0)
MCHC: 31.4 g/dL (ref 30.0–36.0)
MCV: 91 fL (ref 80.0–100.0)
Platelets: 487 10*3/uL — ABNORMAL HIGH (ref 150–400)
RBC: 4.87 MIL/uL (ref 3.87–5.11)
RDW: 14.6 % (ref 11.5–15.5)
WBC: 12.2 10*3/uL — ABNORMAL HIGH (ref 4.0–10.5)
nRBC: 0 % (ref 0.0–0.2)

## 2021-04-25 LAB — URINALYSIS, ROUTINE W REFLEX MICROSCOPIC
Bilirubin Urine: NEGATIVE
Glucose, UA: NEGATIVE mg/dL
Hgb urine dipstick: NEGATIVE
Ketones, ur: NEGATIVE mg/dL
Leukocytes,Ua: NEGATIVE
Nitrite: NEGATIVE
Protein, ur: NEGATIVE mg/dL
Specific Gravity, Urine: 1.017 (ref 1.005–1.030)
pH: 7 (ref 5.0–8.0)

## 2021-04-25 LAB — HCG, QUANTITATIVE, PREGNANCY: hCG, Beta Chain, Quant, S: <1 m[IU]/mL (ref <5)

## 2021-04-25 LAB — RAPID HIV SCREEN (HIV 1/2 AB+AG)
HIV 1/2 Antibodies: NONREACTIVE
HIV-1 P24 Antigen - HIV24: NONREACTIVE

## 2021-04-26 MED ORDER — CLINDAMYCIN PHOSPHATE 900 MG/50ML IV SOLN
900.0000 mg | INTRAVENOUS | Status: AC
  Administered 2021-04-27: 900 mg via INTRAVENOUS
  Filled 2021-04-26: qty 50

## 2021-04-26 MED ORDER — GENTAMICIN SULFATE 40 MG/ML IJ SOLN
5.0000 mg/kg | INTRAVENOUS | Status: AC
  Administered 2021-04-27: 460 mg via INTRAVENOUS
  Filled 2021-04-26: qty 11.5

## 2021-04-27 ENCOUNTER — Ambulatory Visit (HOSPITAL_COMMUNITY): Payer: BC Managed Care – PPO | Admitting: Certified Registered Nurse Anesthetist

## 2021-04-27 ENCOUNTER — Encounter (HOSPITAL_COMMUNITY)
Admission: RE | Disposition: A | Discharge status: Home / Self Care | Payer: Self-pay | Source: Home / Self Care | Attending: Obstetrics & Gynecology

## 2021-04-27 ENCOUNTER — Encounter (HOSPITAL_COMMUNITY): Payer: Self-pay | Admitting: Obstetrics & Gynecology

## 2021-04-27 ENCOUNTER — Ambulatory Visit (HOSPITAL_COMMUNITY)
Admission: RE | Admit: 2021-04-27 | Discharge: 2021-04-27 | Disposition: A | Discharge status: Home / Self Care | Payer: BC Managed Care – PPO | Attending: Obstetrics & Gynecology | Admitting: Obstetrics & Gynecology

## 2021-04-27 ENCOUNTER — Other Ambulatory Visit: Payer: Self-pay

## 2021-04-27 DIAGNOSIS — Z302 Encounter for sterilization: Secondary | ICD-10-CM | POA: Insufficient documentation

## 2021-04-27 DIAGNOSIS — Z6841 Body Mass Index (BMI) 40.0 and over, adult: Secondary | ICD-10-CM | POA: Diagnosis not present

## 2021-04-27 DIAGNOSIS — Z880 Allergy status to penicillin: Secondary | ICD-10-CM | POA: Insufficient documentation

## 2021-04-27 DIAGNOSIS — J45909 Unspecified asthma, uncomplicated: Secondary | ICD-10-CM | POA: Diagnosis not present

## 2021-04-27 DIAGNOSIS — Z888 Allergy status to other drugs, medicaments and biological substances status: Secondary | ICD-10-CM | POA: Diagnosis not present

## 2021-04-27 HISTORY — PX: LAPAROSCOPIC BILATERAL SALPINGECTOMY: SHX5889

## 2021-04-27 SURGERY — SALPINGECTOMY, BILATERAL, LAPAROSCOPIC
Anesthesia: General | Site: Uterus | Laterality: Bilateral

## 2021-04-27 MED ORDER — PROPOFOL 10 MG/ML IV BOLUS
INTRAVENOUS | Status: DC | PRN
Start: 2021-04-27 — End: 2021-04-27
  Administered 2021-04-27: 200 mg via INTRAVENOUS

## 2021-04-27 MED ORDER — BUPIVACAINE LIPOSOME 1.3 % IJ SUSP
INTRAMUSCULAR | Status: AC
  Filled 2021-04-27: qty 20

## 2021-04-27 MED ORDER — LACTATED RINGERS IV SOLN: INTRAVENOUS | Status: DC

## 2021-04-27 MED ORDER — LIDOCAINE HCL (CARDIAC) PF 100 MG/5ML IV SOSY
PREFILLED_SYRINGE | INTRAVENOUS | Status: DC | PRN
  Administered 2021-04-27: 100 mg via INTRATRACHEAL

## 2021-04-27 MED ORDER — ONDANSETRON HCL 4 MG/2ML IJ SOLN
INTRAMUSCULAR | Status: AC
  Filled 2021-04-27: qty 2

## 2021-04-27 MED ORDER — PROPOFOL 10 MG/ML IV BOLUS
INTRAVENOUS | Status: AC
  Filled 2021-04-27: qty 20

## 2021-04-27 MED ORDER — SUGAMMADEX SODIUM 500 MG/5ML IV SOLN
INTRAVENOUS | Status: AC
  Filled 2021-04-27: qty 5

## 2021-04-27 MED ORDER — SUCCINYLCHOLINE CHLORIDE 200 MG/10ML IV SOSY
PREFILLED_SYRINGE | INTRAVENOUS | Status: DC | PRN
  Administered 2021-04-27: 140 mg via INTRAVENOUS

## 2021-04-27 MED ORDER — MIDAZOLAM HCL 2 MG/2ML IJ SOLN
INTRAMUSCULAR | Status: DC | PRN
  Administered 2021-04-27: 2 mg via INTRAVENOUS

## 2021-04-27 MED ORDER — ONDANSETRON HCL 4 MG/2ML IJ SOLN
INTRAMUSCULAR | Status: DC | PRN
  Administered 2021-04-27: 4 mg via INTRAVENOUS

## 2021-04-27 MED ORDER — POVIDONE-IODINE 10 % EX SWAB: 2.0000 "application " | Freq: Once | CUTANEOUS | Status: DC

## 2021-04-27 MED ORDER — DIPHENHYDRAMINE HCL 50 MG/ML IJ SOLN
INTRAMUSCULAR | Status: DC | PRN
  Administered 2021-04-27: 12.5 mg via INTRAVENOUS

## 2021-04-27 MED ORDER — DEXAMETHASONE SODIUM PHOSPHATE 10 MG/ML IJ SOLN
INTRAMUSCULAR | Status: AC
  Filled 2021-04-27: qty 1

## 2021-04-27 MED ORDER — BUPIVACAINE LIPOSOME 1.3 % IJ SUSP
INTRAMUSCULAR | Status: DC | PRN
  Administered 2021-04-27: 20 mL

## 2021-04-27 MED ORDER — KETOROLAC TROMETHAMINE 10 MG PO TABS: 10.0000 mg | ORAL_TABLET | Freq: Three times a day (TID) | ORAL | 0 refills | Status: DC | PRN

## 2021-04-27 MED ORDER — DEXAMETHASONE SODIUM PHOSPHATE 10 MG/ML IJ SOLN
INTRAMUSCULAR | Status: DC | PRN
Start: 2021-04-27 — End: 2021-04-27
  Administered 2021-04-27: 5 mg via INTRAVENOUS

## 2021-04-27 MED ORDER — ROCURONIUM BROMIDE 10 MG/ML (PF) SYRINGE
PREFILLED_SYRINGE | INTRAVENOUS | Status: AC
  Filled 2021-04-27: qty 10

## 2021-04-27 MED ORDER — ONDANSETRON HCL 4 MG/2ML IJ SOLN: 4.0000 mg | Freq: Once | INTRAMUSCULAR | Status: DC | PRN

## 2021-04-27 MED ORDER — CHLORHEXIDINE GLUCONATE 0.12 % MT SOLN
15.0000 mL | Freq: Once | OROMUCOSAL | Status: AC
  Administered 2021-04-27: 15 mL via OROMUCOSAL

## 2021-04-27 MED ORDER — SODIUM CHLORIDE 0.9 % IR SOLN
Status: DC | PRN
  Administered 2021-04-27: 1000 mL

## 2021-04-27 MED ORDER — ONDANSETRON 8 MG PO TBDP: 8.0000 mg | ORAL_TABLET | Freq: Three times a day (TID) | ORAL | 0 refills | Status: DC | PRN

## 2021-04-27 MED ORDER — LIDOCAINE HCL (PF) 2 % IJ SOLN
INTRAMUSCULAR | Status: AC
  Filled 2021-04-27: qty 5

## 2021-04-27 MED ORDER — HYDROCODONE-ACETAMINOPHEN 5-325 MG PO TABS: 1.0000 | ORAL_TABLET | Freq: Four times a day (QID) | ORAL | 0 refills | Status: DC | PRN

## 2021-04-27 MED ORDER — FENTANYL CITRATE PF 50 MCG/ML IJ SOSY: 25.0000 ug | PREFILLED_SYRINGE | INTRAMUSCULAR | Status: DC | PRN

## 2021-04-27 MED ORDER — BUPIVACAINE LIPOSOME 1.3 % IJ SUSP
20.0000 mL | Freq: Once | INTRAMUSCULAR | Status: DC
  Filled 2021-04-27: qty 20

## 2021-04-27 MED ORDER — KETOROLAC TROMETHAMINE 30 MG/ML IJ SOLN
30.0000 mg | Freq: Once | INTRAMUSCULAR | Status: AC
  Administered 2021-04-27: 30 mg via INTRAVENOUS
  Filled 2021-04-27: qty 1

## 2021-04-27 MED ORDER — DIPHENHYDRAMINE HCL 50 MG/ML IJ SOLN
INTRAMUSCULAR | Status: AC
  Filled 2021-04-27: qty 1

## 2021-04-27 MED ORDER — SUCCINYLCHOLINE CHLORIDE 200 MG/10ML IV SOSY
PREFILLED_SYRINGE | INTRAVENOUS | Status: AC
  Filled 2021-04-27: qty 10

## 2021-04-27 MED ORDER — ROCURONIUM BROMIDE 10 MG/ML (PF) SYRINGE
PREFILLED_SYRINGE | INTRAVENOUS | Status: DC | PRN
  Administered 2021-04-27: 40 mg via INTRAVENOUS

## 2021-04-27 MED ORDER — ORAL CARE MOUTH RINSE: 15.0000 mL | Freq: Once | OROMUCOSAL | Status: AC

## 2021-04-27 MED ORDER — FENTANYL CITRATE (PF) 250 MCG/5ML IJ SOLN
INTRAMUSCULAR | Status: AC
  Filled 2021-04-27: qty 5

## 2021-04-27 MED ORDER — SUGAMMADEX SODIUM 500 MG/5ML IV SOLN
INTRAVENOUS | Status: DC | PRN
  Administered 2021-04-27: 400 mg via INTRAVENOUS

## 2021-04-27 MED ORDER — MIDAZOLAM HCL 2 MG/2ML IJ SOLN
INTRAMUSCULAR | Status: AC
  Filled 2021-04-27: qty 2

## 2021-04-27 MED ORDER — FENTANYL CITRATE (PF) 100 MCG/2ML IJ SOLN
INTRAMUSCULAR | Status: DC | PRN
  Administered 2021-04-27: 100 ug via INTRAVENOUS
  Administered 2021-04-27 (×3): 50 ug via INTRAVENOUS

## 2021-04-27 SURGICAL SUPPLY — 39 items
ADH SKN CLS APL DERMABOND .7 (GAUZE/BANDAGES/DRESSINGS) ×1
BAG HAMPER (MISCELLANEOUS) ×2 IMPLANT
BLADE SURG SZ11 CARB STEEL (BLADE) ×2 IMPLANT
CLOTH BEACON ORANGE TIMEOUT ST (SAFETY) ×2 IMPLANT
COVER LIGHT HANDLE STERIS (MISCELLANEOUS) ×4 IMPLANT
DERMABOND ADVANCED (GAUZE/BANDAGES/DRESSINGS) ×1
DERMABOND ADVANCED .7 DNX12 (GAUZE/BANDAGES/DRESSINGS) ×1 IMPLANT
ELECT REM PT RETURN 9FT ADLT (ELECTROSURGICAL) ×2
ELECTRODE REM PT RTRN 9FT ADLT (ELECTROSURGICAL) ×1 IMPLANT
GAUZE 4X4 16PLY ~~LOC~~+RFID DBL (SPONGE) ×3 IMPLANT
GLOVE ECLIPSE 8.0 STRL XLNG CF (GLOVE) ×2 IMPLANT
GLOVE SRG 8 PF TXTR STRL LF DI (GLOVE) ×1 IMPLANT
GLOVE SURG UNDER POLY LF SZ7 (GLOVE) ×6 IMPLANT
GLOVE SURG UNDER POLY LF SZ8 (GLOVE) ×2
GOWN STRL REUS W/TWL LRG LVL3 (GOWN DISPOSABLE) ×2 IMPLANT
GOWN STRL REUS W/TWL XL LVL3 (GOWN DISPOSABLE) ×2 IMPLANT
INST SET LAPROSCOPIC GYN AP (KITS) ×2 IMPLANT
KIT TURNOVER CYSTO (KITS) ×2 IMPLANT
NDL HYPO 18GX1.5 BLUNT FILL (NEEDLE) ×1 IMPLANT
NDL HYPO 21X1.5 SAFETY (NEEDLE) ×1 IMPLANT
NDL INSUFFLATION 14GA 120MM (NEEDLE) ×1 IMPLANT
NEEDLE HYPO 18GX1.5 BLUNT FILL (NEEDLE) ×2 IMPLANT
NEEDLE HYPO 21X1.5 SAFETY (NEEDLE) ×2 IMPLANT
NEEDLE INSUFFLATION 14GA 120MM (NEEDLE) ×2 IMPLANT
PACK PERI GYN (CUSTOM PROCEDURE TRAY) ×2 IMPLANT
PAD ARMBOARD 7.5X6 YLW CONV (MISCELLANEOUS) ×2 IMPLANT
SET BASIN LINEN APH (SET/KITS/TRAYS/PACK) ×2 IMPLANT
SET TUBE SMOKE EVAC HIGH FLOW (TUBING) ×2 IMPLANT
SHEARS HARMONIC ACE PLUS 36CM (ENDOMECHANICALS) ×2 IMPLANT
SLEEVE ENDOPATH XCEL 5M (ENDOMECHANICALS) ×2 IMPLANT
SOL ANTI FOG 6CC (MISCELLANEOUS) ×1 IMPLANT
SOLUTION ANTI FOG 6CC (MISCELLANEOUS) ×1
SUT VICRYL 0 UR6 27IN ABS (SUTURE) ×2 IMPLANT
SUT VICRYL AB 3-0 FS1 BRD 27IN (SUTURE) ×4 IMPLANT
SYR 10ML LL (SYRINGE) ×2 IMPLANT
SYR 20ML LL LF (SYRINGE) ×4 IMPLANT
TROCAR ENDO BLADELESS 11MM (ENDOMECHANICALS) ×2 IMPLANT
TROCAR XCEL NON-BLD 5MMX100MML (ENDOMECHANICALS) ×2 IMPLANT
WARMER LAPAROSCOPE (MISCELLANEOUS) ×2 IMPLANT

## 2021-04-27 NOTE — Op Note (Signed)
Preoperative Diagnosis:  1.  Multiparous female desires permanent sterilization                                          2.  Elects to have bilateral salpingectomy for ovarian cancer prophylaxis  Postoperative Diagnosis:  Same as above  Procedure:  Laparoscopic Bilateral Salpingectomy for the purpose of permanent sterilization  Surgeon:  Jacelyn Grip MD  Anaesthesia: general  Findings:  Patient had normal pelvic anatomy and no intraperitoneal abnormalities.  Description of Operation:  Patient was taken to the OR and placed into supine position where she underwent general anaesthesia.   She was placed in the dorsal lithotomy position and prepped and draped in the usual sterile fashion.   An elliptical incision was made in the supra umbilical region by Dr Arnoldo Morale and dissection taken down to the rectus fascia. A Veres needle was used to insufflate the periotneal cavity. An 11 mm non bladed video laparoscope trocar was then placed under direct visualization without difficulty.   The above noted findings were observed.   Two additional 5 mm non bladed trocars were placed in the right and left lower quadrants under direct visualization without difficulty.   The Harmonic scalpel was employed and a salpingectomy of both the right and left tubes was performed.   The tubes were removed from the peritoneal cavity and sent to pathology.   There was good hemostasis bilaterally.   The fascia, peritoneum and subcutaneous tissue were closed using 0 vicryl.   All 3 skin incisions were closed using 3-0 vicryl in a subcuticular fashion.  Exparel 266 mg 20 cc was injected in the 3 incisional/trocar sites.  The patient was awakened from anaesthesia and taken to the PACU with all counts being correct x 3.   The patient received  2 gram of ancef andToradol 30 mg IV preoperatively.  Shannon Brooks 04/27/2021 10:01 AM

## 2021-04-27 NOTE — H&P (Signed)
Preoperative History and Physical  Shannon Brooks is a 35 y.o. G0P0000 with Patient's last menstrual period was 04/12/2021 (approximate). admitted for a laparoscopic bilateral salpingectomy for permanent sterilization.    PMH:    Past Medical History:  Diagnosis Date   Asthma    Broken toe    Carpal tunnel syndrome    Iron deficiency anemia    Migraine    Near syncope Episodic near-syncope   Obesity, unspecified    Obesity   Vertigo    Vitamin D deficiency     PSH:     Past Surgical History:  Procedure Laterality Date   TONSILLECTOMY     WISDOM TOOTH EXTRACTION Bilateral     POb/GynH:      OB History     Gravida  0   Para  0   Term  0   Preterm  0   AB  0   Living  0      SAB  0   IAB  0   Ectopic  0   Multiple  0   Live Births  0           SH:   Social History   Tobacco Use   Smoking status: Never   Smokeless tobacco: Never  Vaping Use   Vaping Use: Never used  Substance Use Topics   Alcohol use: No   Drug use: No    FH:    Family History  Problem Relation Age of Onset   Deafness Mother    Diabetes Mother    Diabetes Maternal Grandmother    Brain cancer Maternal Grandmother    Diabetes Maternal Grandfather    Multiple sclerosis Other    Lupus Other      Allergies:  Allergies  Allergen Reactions   Penicillins     Was told she was allergic but has taken, amoxicillin with no reaction   Desipramine Rash    Medications:       Current Facility-Administered Medications:    bupivacaine liposome (EXPAREL) 1.3 % injection 266 mg, 20 mL, Infiltration, Once, Sultana Tierney, Mertie Clause, MD   clindamycin (CLEOCIN) IVPB 900 mg, 900 mg, Intravenous, 60 min Pre-Op **AND** gentamicin (GARAMYCIN) 460 mg in dextrose 5 % 100 mL IVPB, 5 mg/kg (Adjusted), Intravenous, 60 min Pre-Op, Petro Talent, Mertie Clause, MD   ketorolac (TORADOL) 30 MG/ML injection 30 mg, 30 mg, Intravenous, Once, Dajanique Robley, Mertie Clause, MD   povidone-iodine 10 % swab 2 application, 2 application,  Topical, Once, Florian Buff, MD  Review of Systems:   Review of Systems  Constitutional: Negative for fever, chills, weight loss, malaise/fatigue and diaphoresis.  HENT: Negative for hearing loss, ear pain, nosebleeds, congestion, sore throat, neck pain, tinnitus and ear discharge.   Eyes: Negative for blurred vision, double vision, photophobia, pain, discharge and redness.  Respiratory: Negative for cough, hemoptysis, sputum production, shortness of breath, wheezing and stridor.   Cardiovascular: Negative for chest pain, palpitations, orthopnea, claudication, leg swelling and PND.  Gastrointestinal: Positive for abdominal pain. Negative for heartburn, nausea, vomiting, diarrhea, constipation, blood in stool and melena.  Genitourinary: Negative for dysuria, urgency, frequency, hematuria and flank pain.  Musculoskeletal: Negative for myalgias, back pain, joint pain and falls.  Skin: Negative for itching and rash.  Neurological: Negative for dizziness, tingling, tremors, sensory change, speech change, focal weakness, seizures, loss of consciousness, weakness and headaches.  Endo/Heme/Allergies: Negative for environmental allergies and polydipsia. Does not bruise/bleed easily.  Psychiatric/Behavioral: Negative for depression, suicidal ideas, hallucinations, memory  loss and substance abuse. The patient is not nervous/anxious and does not have insomnia.      PHYSICAL EXAM:  Blood pressure 108/65, pulse 76, temperature 98.3 F (36.8 C), temperature source Oral, resp. rate (!) 21, last menstrual period 04/12/2021, SpO2 100 %.    Vitals reviewed. Constitutional: She is oriented to person, place, and time. She appears well-developed and well-nourished.  HENT:  Head: Normocephalic and atraumatic.  Right Ear: External ear normal.  Left Ear: External ear normal.  Nose: Nose normal.  Mouth/Throat: Oropharynx is clear and moist.  Eyes: Conjunctivae and EOM are normal. Pupils are equal, round,  and reactive to light. Right eye exhibits no discharge. Left eye exhibits no discharge. No scleral icterus.  Neck: Normal range of motion. Neck supple. No tracheal deviation present. No thyromegaly present.  Cardiovascular: Normal rate, regular rhythm, normal heart sounds and intact distal pulses.  Exam reveals no gallop and no friction rub.   No murmur heard. Respiratory: Effort normal and breath sounds normal. No respiratory distress. She has no wheezes. She has no rales. She exhibits no tenderness.  GI: Soft. Bowel sounds are normal. She exhibits no distension and no mass. There is tenderness. There is no rebound and no guarding.  Genitourinary:       Vulva is normal without lesions Vagina is pink moist without discharge Cervix normal in appearance and pap is normal Uterus is normal size, contour, position, consistency, mobility, non-tender Adnexa is negative with normal sized ovaries by sonogram  Musculoskeletal: Normal range of motion. She exhibits no edema and no tenderness.  Neurological: She is alert and oriented to person, place, and time. She has normal reflexes. She displays normal reflexes. No cranial nerve deficit. She exhibits normal muscle tone. Coordination normal.  Skin: Skin is warm and dry. No rash noted. No erythema. No pallor.  Psychiatric: She has a normal mood and affect. Her behavior is normal. Judgment and thought content normal.    Labs: Results for orders placed or performed during the hospital encounter of 04/25/21 (from the past 336 hour(s))  CBC   Collection Time: 04/25/21  9:07 AM  Result Value Ref Range   WBC 12.2 (H) 4.0 - 10.5 K/uL   RBC 4.87 3.87 - 5.11 MIL/uL   Hemoglobin 13.9 12.0 - 15.0 g/dL   HCT 44.3 36.0 - 46.0 %   MCV 91.0 80.0 - 100.0 fL   MCH 28.5 26.0 - 34.0 pg   MCHC 31.4 30.0 - 36.0 g/dL   RDW 14.6 11.5 - 15.5 %   Platelets 487 (H) 150 - 400 K/uL   nRBC 0.0 0.0 - 0.2 %  Comprehensive metabolic panel   Collection Time: 04/25/21  9:07 AM   Result Value Ref Range   Sodium 136 135 - 145 mmol/L   Potassium 3.7 3.5 - 5.1 mmol/L   Chloride 106 98 - 111 mmol/L   CO2 21 (L) 22 - 32 mmol/L   Glucose, Bld 75 70 - 99 mg/dL   BUN 14 6 - 20 mg/dL   Creatinine, Ser 0.66 0.44 - 1.00 mg/dL   Calcium 9.5 8.9 - 10.3 mg/dL   Total Protein 7.5 6.5 - 8.1 g/dL   Albumin 4.0 3.5 - 5.0 g/dL   AST 22 15 - 41 U/L   ALT 23 0 - 44 U/L   Alkaline Phosphatase 62 38 - 126 U/L   Total Bilirubin 0.6 0.3 - 1.2 mg/dL   GFR, Estimated >60 >60 mL/min   Anion gap 9 5 -  15  hCG, quantitative, pregnancy   Collection Time: 04/25/21  9:07 AM  Result Value Ref Range   hCG, Beta Chain, Quant, S <1 <5 mIU/mL  Rapid HIV screen (HIV 1/2 Ab+Ag)   Collection Time: 04/25/21  9:07 AM  Result Value Ref Range   HIV-1 P24 Antigen - HIV24 NON REACTIVE NON REACTIVE   HIV 1/2 Antibodies NON REACTIVE NON REACTIVE   Interpretation (HIV Ag Ab)      A non reactive test result means that HIV 1 or HIV 2 antibodies and HIV 1 p24 antigen were not detected in the specimen.  Urinalysis, Routine w reflex microscopic Urine, Clean Catch   Collection Time: 04/25/21  9:07 AM  Result Value Ref Range   Color, Urine YELLOW YELLOW   APPearance HAZY (A) CLEAR   Specific Gravity, Urine 1.017 1.005 - 1.030   pH 7.0 5.0 - 8.0   Glucose, UA NEGATIVE NEGATIVE mg/dL   Hgb urine dipstick NEGATIVE NEGATIVE   Bilirubin Urine NEGATIVE NEGATIVE   Ketones, ur NEGATIVE NEGATIVE mg/dL   Protein, ur NEGATIVE NEGATIVE mg/dL   Nitrite NEGATIVE NEGATIVE   Leukocytes,Ua NEGATIVE NEGATIVE    EKG: Orders placed or performed in visit on 04/25/21   EKG 12-Lead   EKG 12-Lead   EKG 12-Lead    Imaging Studies: No results found.    Assessment: Multiparous female desires permanent sterilization Morbid obesity  Plan: Laparoscopic bilateral salpingectomy for sterilization, pt opted for salpingectomy as specific methodology  Florian Buff 04/27/2021 8:25 AM

## 2021-04-27 NOTE — Anesthesia Procedure Notes (Signed)
Procedure Name: Intubation Date/Time: 04/27/2021 8:59 AM Performed by: Vista Deck, CRNA Pre-anesthesia Checklist: Patient identified, Patient being monitored, Timeout performed, Emergency Drugs available and Suction available Patient Re-evaluated:Patient Re-evaluated prior to induction Oxygen Delivery Method: Circle System Utilized Preoxygenation: Pre-oxygenation with 100% oxygen Induction Type: IV induction Ventilation: Mask ventilation without difficulty Laryngoscope Size: Mac and 3 Grade View: Grade II Tube type: Oral Tube size: 7.0 mm Number of attempts: 1 Airway Equipment and Method: stylet Placement Confirmation: ETT inserted through vocal cords under direct vision, positive ETCO2 and breath sounds checked- equal and bilateral Secured at: 22 cm Tube secured with: Tape Dental Injury: Teeth and Oropharynx as per pre-operative assessment

## 2021-04-27 NOTE — Anesthesia Postprocedure Evaluation (Signed)
Anesthesia Post Note  Patient: Shannon Brooks  Procedure(s) Performed: LAPAROSCOPIC BILATERAL SALPINGECTOMY (Bilateral: Uterus)  Patient location during evaluation: Phase II Anesthesia Type: General Level of consciousness: awake Pain management: pain level controlled Vital Signs Assessment: post-procedure vital signs reviewed and stable Respiratory status: spontaneous breathing and respiratory function stable Cardiovascular status: blood pressure returned to baseline and stable Postop Assessment: no headache and no apparent nausea or vomiting Anesthetic complications: no Comments: Late entry   No notable events documented.   Last Vitals:  Vitals:   04/27/21 1030 04/27/21 1054  BP: 130/73 130/74  Pulse: 70 71  Resp: 13 18  Temp:  36.6 C  SpO2: 100% 100%    Last Pain:  Vitals:   04/27/21 1054  TempSrc: Oral  PainSc: [place]

## 2021-04-27 NOTE — Progress Notes (Signed)
Patient's mother was upset that Dr. Elonda Husky had not called her about the surgery.  Patient told the PACU nurse that her mother was deaf so the PACU nurse put a note to that effect on the chart.  The provider was notified and asked to call the mother.  She has a captioning service that allows her to receive phone messages.  Mother was understanding when the situation was explained to her.

## 2021-04-27 NOTE — Anesthesia Preprocedure Evaluation (Signed)
Anesthesia Evaluation  Patient identified by MRN, date of birth, ID band Patient awake    Reviewed: Allergy & Precautions, H&P , NPO status , Patient's Chart, lab work & pertinent test results, reviewed documented beta blocker date and time   Airway Mallampati: III  TM Distance: >3 FB Neck ROM: full    Dental no notable dental hx.    Pulmonary asthma ,    Pulmonary exam normal breath sounds clear to auscultation       Cardiovascular Exercise Tolerance: Good negative cardio ROS   Rhythm:regular Rate:Normal     Neuro/Psych  Headaches,  Neuromuscular disease negative psych ROS   GI/Hepatic negative GI ROS, Neg liver ROS,   Endo/Other  Morbid obesity  Renal/GU negative Renal ROS  negative genitourinary   Musculoskeletal   Abdominal   Peds  Hematology  (+) Blood dyscrasia, anemia ,   Anesthesia Other Findings   Reproductive/Obstetrics negative OB ROS                             Anesthesia Physical Anesthesia Plan  ASA: 3  Anesthesia Plan: General and General ETT   Post-op Pain Management:    Induction:   PONV Risk Score and Plan: Ondansetron  Airway Management Planned:   Additional Equipment:   Intra-op Plan:   Post-operative Plan:   Informed Consent: I have reviewed the patients History and Physical, chart, labs and discussed the procedure including the risks, benefits and alternatives for the proposed anesthesia with the patient or authorized representative who has indicated his/her understanding and acceptance.     Dental Advisory Given  Plan Discussed with: CRNA  Anesthesia Plan Comments:         Anesthesia Quick Evaluation

## 2021-04-27 NOTE — Transfer of Care (Signed)
Immediate Anesthesia Transfer of Care Note  Patient: Shannon Brooks  Procedure(s) Performed: LAPAROSCOPIC BILATERAL SALPINGECTOMY (Bilateral: Uterus)  Patient Location: PACU  Anesthesia Type:General  Level of Consciousness: awake and alert   Airway & Oxygen Therapy: Patient Spontanous Breathing and Patient connected to nasal cannula oxygen  Post-op Assessment: Report given to RN and Post -op Vital signs reviewed and stable  Post vital signs: Reviewed and stable  Last Vitals:  Vitals Value Taken Time  BP 133/81   Temp    Pulse 77   Resp 12   SpO2 100     Last Pain:  Vitals:   04/27/21 0754  TempSrc: Oral  PainSc:          Complications: No notable events documented.

## 2021-04-28 ENCOUNTER — Encounter (HOSPITAL_COMMUNITY): Payer: Self-pay | Admitting: Obstetrics & Gynecology

## 2021-04-28 ENCOUNTER — Telehealth: Payer: Self-pay | Admitting: Obstetrics & Gynecology

## 2021-04-28 LAB — SURGICAL PATHOLOGY

## 2021-04-28 NOTE — Telephone Encounter (Signed)
I called pt regarding peri-bottle. Pt states she got one in [place]

## 2021-04-28 NOTE — Telephone Encounter (Signed)
Pt needs suggestions on getting a peri-bottle   Wasn't given one when discharged from the hospital yesterday after her surgery.  Has called around to local pharmacies & no one has one  Please advise & notify pt

## 2021-05-06 ENCOUNTER — Encounter: Payer: Self-pay | Admitting: Obstetrics & Gynecology

## 2021-05-06 ENCOUNTER — Telehealth (INDEPENDENT_AMBULATORY_CARE_PROVIDER_SITE_OTHER): Payer: BC Managed Care – PPO | Admitting: Obstetrics & Gynecology

## 2021-05-06 DIAGNOSIS — Z9889 Other specified postprocedural states: Secondary | ICD-10-CM

## 2021-05-06 NOTE — Progress Notes (Signed)
   My Chart video visit Patient is at home I am in my office Total time: 10 minutes  HPI: Patient returns for routine postoperative follow-up having undergone laparoscopic bilateral salpingectomy on 04/27/21.  The patient's immediate postoperative recovery has been unremarkable. Since hospital discharge the patient reports no problems.   Current Outpatient Medications: acetaminophen (TYLENOL) 325 MG tablet, Take 650 mg by mouth every 6 (six) hours as needed for moderate pain., Disp: , Rfl:  albuterol (PROVENTIL HFA;VENTOLIN HFA) 108 (90 BASE) MCG/ACT inhaler, Inhale 2 puffs into the lungs every 6 (six) hours as needed for wheezing or shortness of breath. , Disp: , Rfl:  cetirizine (ZYRTEC) 10 MG tablet, Chew 10 mg by mouth daily., Disp: , Rfl:  chlorpheniramine (CHLOR-TRIMETON) 4 MG tablet, Take 4 mg by mouth in the morning and at bedtime., Disp: , Rfl:  Cholecalciferol (VITAMIN D) 50 MCG (2000 UT) tablet, Take 2,000 Units by mouth daily., Disp: , Rfl:  ergocalciferol (VITAMIN D2) 1.25 MG (50000 UT) capsule, Take 50,000 Units by mouth once a week., Disp: , Rfl:  ferrous sulfate 325 (65 FE) MG tablet, Take 325 mg by mouth daily with breakfast., Disp: , Rfl:  guaifenesin (HUMIBID E) 400 MG TABS tablet, Take 400 mg by mouth in the morning and at bedtime., Disp: , Rfl:  HYDROcodone-acetaminophen (NORCO/VICODIN) 5-325 MG tablet, Take 1 tablet by mouth every 6 (six) hours as needed., Disp: 10 tablet, Rfl: 0 hyoscyamine (LEVSIN) 0.125 MG tablet, Take 0.125 mg by mouth every 6 (six) hours as needed (diarrhea)., Disp: , Rfl:  ibuprofen (ADVIL,MOTRIN) 800 MG tablet, Take 800 mg by mouth at bedtime., Disp: , Rfl:  JENCYCLA 0.35 MG tablet, Take 1 tablet by mouth once daily, Disp: 168 tablet, Rfl: 3 ketorolac (TORADOL) 10 MG tablet, Take 1 tablet (10 mg total) by mouth every 8 (eight) hours as needed., Disp: 15 tablet, Rfl: 0 magnesium oxide (MAG-OX) 400 MG tablet, Take 400 mg by mouth 2 (two) times  daily., Disp: , Rfl:  Menthol, Topical Analgesic, (BIOFREEZE EX), Apply 1 application topically daily as needed (pain)., Disp: , Rfl:  ondansetron (ZOFRAN ODT) 8 MG disintegrating tablet, Take 1 tablet (8 mg total) by mouth every 8 (eight) hours as needed for nausea or vomiting., Disp: 8 tablet, Rfl: 0 Phenylephrine HCl 5 MG TABS, Take 5 mg by mouth in the morning and at bedtime., Disp: , Rfl:  Potassium 99 MG TABS, Take 99 mg by mouth daily., Disp: , Rfl:  Riboflavin (VITAMIN B2 PO), Take 100 mg by mouth 2 (two) times daily., Disp: , Rfl:  SUMAtriptan (IMITREX) 50 MG tablet, Take 1 tablet (50 mg total) by mouth every 2 (two) hours as needed for migraine. May repeat in 2 hours if headache persists or recurs., Disp: 12 tablet, Rfl: 11 topiramate (TOPAMAX) 100 MG tablet, Take 1 tablet (100 mg total) by mouth 2 (two) times daily., Disp: 180 tablet, Rfl: 3  No current facility-administered medications for this visit.    Last menstrual period 04/12/2021.  Physical Exam: Gen WDWN NAD  Diagnostic Tests:   Pathology: benign  Impression:   ICD-10-CM   1. S/P laparoscopic bilateral salpingectomy for permanent sterilization 04/27/21  Z98.890         Plan: No orders of the defined types were placed in this encounter.    Follow up: prn   Florian Buff, MD

## 2021-10-18 DIAGNOSIS — R7301 Impaired fasting glucose: Secondary | ICD-10-CM | POA: Diagnosis not present

## 2021-10-18 DIAGNOSIS — E782 Mixed hyperlipidemia: Secondary | ICD-10-CM | POA: Diagnosis not present

## 2021-10-18 DIAGNOSIS — E559 Vitamin D deficiency, unspecified: Secondary | ICD-10-CM | POA: Diagnosis not present

## 2021-10-18 DIAGNOSIS — D509 Iron deficiency anemia, unspecified: Secondary | ICD-10-CM | POA: Diagnosis not present

## 2021-10-18 DIAGNOSIS — Z139 Encounter for screening, unspecified: Secondary | ICD-10-CM | POA: Diagnosis not present

## 2021-10-19 DIAGNOSIS — Z0001 Encounter for general adult medical examination with abnormal findings: Secondary | ICD-10-CM | POA: Diagnosis not present

## 2021-11-10 ENCOUNTER — Telehealth: Payer: Self-pay | Admitting: Neurology

## 2021-11-10 NOTE — Telephone Encounter (Signed)
Rescheduled 6/1 appointment with pt over the phone- Judson Roch out of office. ?

## 2021-12-06 ENCOUNTER — Inpatient Hospital Stay (HOSPITAL_COMMUNITY): Payer: BC Managed Care – PPO

## 2021-12-13 ENCOUNTER — Ambulatory Visit (HOSPITAL_COMMUNITY): Payer: Medicare Other | Admitting: Physician Assistant

## 2021-12-13 NOTE — Progress Notes (Unsigned)
PATIENT: Shannon Brooks DOB: [birth year]  REASON FOR VISIT: follow up HISTORY FROM: patient  HISTORY OF PRESENT ILLNESS: Today 12/13/21  HISTORY Shannon Brooks is a 36 years old female, seen in refer by his primary care doctor Celene Squibb for evaluation of dizziness, initial evaluation was on April 10 2017.   I reviewed and summarized the referring note, she had a past medical history of chronic migraine, obese, presented with frequent vertigo with associated headache since 2017    She had migraine headaches since 36 years old, her typical migraine are lateralized severe pounding headache with associated light noise sensitivity, nauseous, lasting for a few hours, was helped by caffeine, over-the-counter Excedrin Migraine, Tylenol ibuprofen as needed, sometimes her migraines are preceded by auras, visual distortion, dizziness,   She also complains of intermittent vertigo episodes since 2013, gradually getting worse especially since 2017, over the past few months, she has it almost on a weekly basis, sudden onset of unsteady gait, difficulty focusing, brain foggy sensation, lasting for few hours, caffeine usually helps her symptoms temporarily, oftentimes is followed by severe migraine headaches,   Stopping birth control in March 2018 seems to make her symptoms worse    Update July 10, 2017: She is now taking Topamax 100 mg twice a day, doing very well, lost 11 pounds over past 3 months, Imitrex as needed works well for her migraine headaches, and recurrent dizzy spells, continue have recurrent spells of lightheadedness, whole body tremor, without loss consciousness lasting for a few minutes, not necessarily associated with headaches,   UPDATE September 30 2018: She is over all happy with current migraine control, she is taking Topamax '200mg'$  daily, magnesium oxide, riboflavin '100mg'$  twice a day as preventive medications.     She has migraine once a week,  Sometimes precede by  dizziness, sleeping would help.     Imitrex '50mg'$  as needed was helpful.   She also came with a list of constellation of complaints, frequent body tremor, disturbance spells, staring at the computer screen,Watching TV, increased brain fog   EEG was normal in Dec 2018   Update Dec 17, 2019 SS: Overall happy with current migraine control, taking Topamax 100 mg twice a day, magnesium oxide, riboflavin for preventative medications.  Has headache with pain about once a month, will take Imitrex with good benefit.  Has daily dizziness/vertigo vision, "wake up on the boat", it may worsen throughout the day.  She carries a cane just in case.  She is now on disability.  She may have occasional sensation of cold or heat to crown of head during spells.  Daily dizzy spells have been chronic.  Here with husband, not planning to start a family currently.  Has close follow-up with PCP, found to have low vitamin D.  Topamax makes symptoms manageable.  Update Dec 21, 2020 SS: Sees hematology at [facility] for thrombocytosis, vitamin D level has been low, on prescription strength, did trial off Vitamin D, noticed significant worsening in memory, gained 13 lbs, was sleeping a lot. Is back on D2, feeling great now. Rare migraines, noted significant worsening in vertigo when off the D2. Only 4 Imitrex a year, rapid onset, notes worst with menstrual cycle and barometric pressure change. Has dizziness from minute she gets up, topamax "mutes it". Some visual hallucinations rare, could be migraine or side effect from Topamax? Doesn't drive further than 5 minutes from home. Also on magnesium/riboflavin. Has cane if needed.  Update Dec 14, 2021 SS:  REVIEW OF SYSTEMS: Out of a complete 14 system review of symptoms, the patient complains only of the following symptoms, and all other reviewed systems are negative.  Headache   ALLERGIES: Allergies  Allergen Reactions   Penicillins     Was told she was allergic but has taken,  amoxicillin with no reaction   Desipramine Rash    HOME MEDICATIONS: Outpatient Medications Prior to Visit  Medication Sig Dispense Refill   acetaminophen (TYLENOL) 325 MG tablet Take 650 mg by mouth every 6 (six) hours as needed for moderate pain.     albuterol (PROVENTIL HFA;VENTOLIN HFA) 108 (90 BASE) MCG/ACT inhaler Inhale 2 puffs into the lungs every 6 (six) hours as needed for wheezing or shortness of breath.      cetirizine (ZYRTEC) 10 MG tablet Chew 10 mg by mouth daily.     chlorpheniramine (CHLOR-TRIMETON) 4 MG tablet Take 4 mg by mouth in the morning and at bedtime.     Cholecalciferol (VITAMIN D) 50 MCG (2000 UT) tablet Take 2,000 Units by mouth daily.     ergocalciferol (VITAMIN D2) 1.25 MG (50000 UT) capsule Take 50,000 Units by mouth once a week.     ferrous sulfate 325 (65 FE) MG tablet Take 325 mg by mouth daily with breakfast.     guaifenesin (HUMIBID E) 400 MG TABS tablet Take 400 mg by mouth in the morning and at bedtime.     HYDROcodone-acetaminophen (NORCO/VICODIN) 5-325 MG tablet Take 1 tablet by mouth every 6 (six) hours as needed. (Patient not taking: Reported on 05/06/2021) 10 tablet 0   hyoscyamine (LEVSIN) 0.125 MG tablet Take 0.125 mg by mouth every 6 (six) hours as needed (diarrhea).     ibuprofen (ADVIL,MOTRIN) 800 MG tablet Take 800 mg by mouth at bedtime. (Patient not taking: Reported on 05/06/2021)     JENCYCLA 0.35 MG tablet Take 1 tablet by mouth once daily 168 tablet 3   ketorolac (TORADOL) 10 MG tablet Take 1 tablet (10 mg total) by mouth every 8 (eight) hours as needed. (Patient not taking: Reported on 05/06/2021) 15 tablet 0   magnesium oxide (MAG-OX) 400 MG tablet Take 400 mg by mouth 2 (two) times daily.     Menthol, Topical Analgesic, (BIOFREEZE EX) Apply 1 application topically daily as needed (pain).     ondansetron (ZOFRAN ODT) 8 MG disintegrating tablet Take 1 tablet (8 mg total) by mouth every 8 (eight) hours as needed for nausea or vomiting. 8  tablet 0   Phenylephrine HCl 5 MG TABS Take 5 mg by mouth in the morning and at bedtime.     Potassium 99 MG TABS Take 99 mg by mouth daily.     Riboflavin (VITAMIN B2 PO) Take 100 mg by mouth 2 (two) times daily.     SUMAtriptan (IMITREX) 50 MG tablet Take 1 tablet (50 mg total) by mouth every 2 (two) hours as needed for migraine. May repeat in 2 hours if headache persists or recurs. 12 tablet 11   topiramate (TOPAMAX) 100 MG tablet Take 1 tablet (100 mg total) by mouth 2 (two) times daily. 180 tablet 3   No facility-administered medications prior to visit.    PAST MEDICAL HISTORY: Past Medical History:  Diagnosis Date   Asthma    Broken toe    Carpal tunnel syndrome    Iron deficiency anemia    Migraine    Near syncope Episodic near-syncope   Obesity, unspecified    Obesity   Vertigo  Vitamin D deficiency     PAST SURGICAL HISTORY: Past Surgical History:  Procedure Laterality Date   LAPAROSCOPIC BILATERAL SALPINGECTOMY Bilateral 04/27/2021   Procedure: LAPAROSCOPIC BILATERAL SALPINGECTOMY;  Surgeon: Florian Buff, MD;  Location: [facility];  Service: Gynecology;  Laterality: Bilateral;   TONSILLECTOMY     WISDOM TOOTH EXTRACTION Bilateral     FAMILY HISTORY: Family History  Problem Relation Age of Onset   Deafness Mother    Diabetes Mother    Diabetes Maternal Grandmother    Brain cancer Maternal Grandmother    Diabetes Maternal Grandfather    Multiple sclerosis Other    Lupus Other     SOCIAL HISTORY: Social History   Socioeconomic History   Marital status: Married    Spouse name: Not on file   Number of children: 0   Years of education: some college   Highest education level: Not on file  Occupational History   Occupation: Unemployed  Tobacco Use   Smoking status: Never   Smokeless tobacco: Never  Vaping Use   Vaping Use: Never used  Substance and Sexual Activity   Alcohol use: No   Drug use: No   Sexual activity: Yes    Birth control/protection:  Pill  Other Topics Concern   Not on file  Social History Narrative   Lives at home with spouse.   Right-handed.    Occasional caffeine use.   Social Determinants of Health   Financial Resource Strain: Low Risk    Difficulty of Paying Living Expenses: Not hard at all  Food Insecurity: No Food Insecurity   Worried About Charity fundraiser in the Last Year: Never true   [place] in the Last Year: Never true  Transportation Needs: No Transportation Needs   Lack of Transportation (Medical): No   Lack of Transportation (Non-Medical): No  Physical Activity: Insufficiently Active   Days of Exercise per Week: 3 days   Minutes of Exercise per Session: 20 min  Stress: No Stress Concern Present   Feeling of Stress : Not at all  Social Connections: Moderately Isolated   Frequency of Communication with Friends and Family: More than three times a week   Frequency of Social Gatherings with Friends and Family: Twice a week   Attends Religious Services: Never   Marine scientist or Organizations: No   Attends Music therapist: Never   Marital Status: Married  Human resources officer Violence: Not At Risk   Fear of Current or Ex-Partner: No   Emotionally Abused: No   Physically Abused: No   Sexually Abused: No  PHYSICAL EXAM  There were no vitals filed for this visit.  There is no height or weight on file to calculate BMI.  Generalized: Well developed, in no acute distress , obese  Neurological examination  Mentation: Alert oriented to time, place, history taking. Follows all commands speech and language fluent Cranial nerve II-XII: Pupils were equal round reactive to light. Extraocular movements were full, visual field were full on confrontational test. Facial sensation and strength were normal. Head turning and shoulder shrug  were normal and symmetric. Motor: The motor testing reveals 5 over 5 strength of all 4 extremities. Good symmetric motor tone is noted  throughout.  Sensory: Sensory testing is intact to soft touch on all 4 extremities. No evidence of extinction is noted.  Coordination: Cerebellar testing reveals good finger-nose-finger and heel-to-shin bilaterally.  Gait and station: Has to push off to stand, with gait, wide-based,  cautious, mildly unsteady, uses quad cane Reflexes: Deep tendon reflexes are symmetric and normal bilaterally.   DIAGNOSTIC DATA (LABS, IMAGING, TESTING) - I reviewed patient records, labs, notes, testing and imaging myself where available.  Lab Results  Component Value Date   WBC 12.2 (H) 04/25/2021   HGB 13.9 04/25/2021   HCT 44.3 04/25/2021   MCV 91.0 04/25/2021   PLT 487 (H) 04/25/2021      Component Value Date/Time   NA 136 04/25/2021 0907   K 3.7 04/25/2021 0907   CL 106 04/25/2021 0907   CO2 21 (L) 04/25/2021 0907   GLUCOSE 75 04/25/2021 0907   BUN 14 04/25/2021 0907   CREATININE 0.66 04/25/2021 0907   CALCIUM 9.5 04/25/2021 0907   PROT 7.5 04/25/2021 0907   ALBUMIN 4.0 04/25/2021 0907   AST 22 04/25/2021 0907   ALT 23 04/25/2021 0907   ALKPHOS 62 04/25/2021 0907   BILITOT 0.6 04/25/2021 0907   GFRNONAA >60 04/25/2021 0907   GFRAA >60 10/27/2019 1035   No results found for: CHOL, HDL, LDLCALC, LDLDIRECT, TRIG, CHOLHDL No results found for: HGBA1C Lab Results  Component Value Date   VITAMINB12 336 04/15/2019   Lab Results  Component Value Date   TSH 1.410 11/07/2019    ASSESSMENT AND PLAN 36 y.o. year old female  1.  Complicated migraine headache -Continues to be overall stable, happy with Topamax -Continue Topamax 100 mg twice daily for prevention -Continue Imitrex 50 mg as needed for acute headache -Also on magnesium and riboflavin -Follows with hematology for thrombocytosis, PCP, has been found to have low vitamin D, on prescription strength -Follow-up in 1 year or sooner if needed  Evangeline Dakin, DNP 12/13/2021, 3:55 PM [facility] [address] [place], [place] [address] [phone]

## 2021-12-14 ENCOUNTER — Ambulatory Visit (INDEPENDENT_AMBULATORY_CARE_PROVIDER_SITE_OTHER): Payer: BC Managed Care – PPO | Admitting: Neurology

## 2021-12-14 VITALS — BP 110/68 | HR 72 | Ht 65.0 in | Wt 319.0 lb

## 2021-12-14 DIAGNOSIS — R42 Dizziness and giddiness: Secondary | ICD-10-CM

## 2021-12-14 DIAGNOSIS — G43109 Migraine with aura, not intractable, without status migrainosus: Secondary | ICD-10-CM

## 2021-12-14 MED ORDER — TOPIRAMATE 100 MG PO TABS: 100.0000 mg | ORAL_TABLET | Freq: Two times a day (BID) | ORAL | 3 refills | Status: DC

## 2021-12-14 MED ORDER — VENLAFAXINE HCL ER 37.5 MG PO CP24: 37.5000 mg | ORAL_CAPSULE | Freq: Every day | ORAL | 5 refills | Status: DC

## 2021-12-14 MED ORDER — SUMATRIPTAN SUCCINATE 50 MG PO TABS: 50.0000 mg | ORAL_TABLET | ORAL | 11 refills | Status: DC | PRN

## 2021-12-14 NOTE — Patient Instructions (Signed)
Add on Effexor 37.5 mg at bedtime for dizziness/headache, continue Topamax, call me for any worsening or intolerance

## 2021-12-16 ENCOUNTER — Other Ambulatory Visit: Payer: Self-pay | Admitting: Obstetrics & Gynecology

## 2021-12-19 ENCOUNTER — Encounter: Payer: Self-pay | Admitting: Neurology

## 2021-12-22 ENCOUNTER — Ambulatory Visit: Payer: Medicare Other | Admitting: Neurology

## 2021-12-28 ENCOUNTER — Inpatient Hospital Stay (HOSPITAL_COMMUNITY): Payer: BC Managed Care – PPO | Attending: Hematology

## 2021-12-28 DIAGNOSIS — Z808 Family history of malignant neoplasm of other organs or systems: Secondary | ICD-10-CM | POA: Diagnosis not present

## 2021-12-28 DIAGNOSIS — E559 Vitamin D deficiency, unspecified: Secondary | ICD-10-CM | POA: Diagnosis not present

## 2021-12-28 DIAGNOSIS — E611 Iron deficiency: Secondary | ICD-10-CM | POA: Diagnosis not present

## 2021-12-28 DIAGNOSIS — D72829 Elevated white blood cell count, unspecified: Secondary | ICD-10-CM | POA: Insufficient documentation

## 2021-12-28 DIAGNOSIS — D72825 Bandemia: Secondary | ICD-10-CM

## 2021-12-28 DIAGNOSIS — D75839 Thrombocytosis, unspecified: Secondary | ICD-10-CM | POA: Insufficient documentation

## 2021-12-28 LAB — VITAMIN B12: Vitamin B-12: 630 pg/mL (ref 180–914)

## 2021-12-28 LAB — CBC WITH DIFFERENTIAL/PLATELET
Abs Immature Granulocytes: 0.05 10*3/uL (ref 0.00–0.07)
Basophils Absolute: 0.1 10*3/uL (ref 0.0–0.1)
Basophils Relative: 1 %
Eosinophils Absolute: 0.2 10*3/uL (ref 0.0–0.5)
Eosinophils Relative: 1 %
HCT: 42.7 % (ref 36.0–46.0)
Hemoglobin: 13.7 g/dL (ref 12.0–15.0)
Immature Granulocytes: 0 %
Lymphocytes Relative: 20 %
Lymphs Abs: 2.8 10*3/uL (ref 0.7–4.0)
MCH: 28.1 pg (ref 26.0–34.0)
MCHC: 32.1 g/dL (ref 30.0–36.0)
MCV: 87.5 fL (ref 80.0–100.0)
Monocytes Absolute: 1.2 10*3/uL — ABNORMAL HIGH (ref 0.1–1.0)
Monocytes Relative: 8 %
Neutro Abs: 9.7 10*3/uL — ABNORMAL HIGH (ref 1.7–7.7)
Neutrophils Relative %: 70 %
Platelets: 488 10*3/uL — ABNORMAL HIGH (ref 150–400)
RBC: 4.88 MIL/uL (ref 3.87–5.11)
RDW: 14.1 % (ref 11.5–15.5)
WBC: 14 10*3/uL — ABNORMAL HIGH (ref 4.0–10.5)
nRBC: 0 % (ref 0.0–0.2)

## 2021-12-28 LAB — IRON AND TIBC
Iron: 64 ug/dL (ref 28–170)
Saturation Ratios: 20 % (ref 10.4–31.8)
TIBC: 322 ug/dL (ref 250–450)
UIBC: 258 ug/dL

## 2021-12-28 LAB — FERRITIN: Ferritin: 97 ng/mL (ref 11–307)

## 2021-12-28 LAB — VITAMIN D 25 HYDROXY (VIT D DEFICIENCY, FRACTURES): Vit D, 25-Hydroxy: 94.98 ng/mL (ref 30–100)

## 2021-12-28 LAB — LACTATE DEHYDROGENASE: LDH: 168 U/L (ref 98–192)

## 2022-01-03 NOTE — Progress Notes (Unsigned)
[facility] [address] [place] [address]   CLINIC:  Medical Oncology/Hematology  PCP:  Celene Squibb, MD [place] [address] [phone]   REASON FOR VISIT:  Follow-up for leukocytosis and thrombocytosis  PRIOR THERAPY: None  CURRENT THERAPY: Surveillance  INTERVAL HISTORY:  Ms. Boies 36 y.o. female returns for routine follow-up of her leukocytosis and thrombocytosis.  She was last seen by Tarri Abernethy PA-C on 12/02/2020.  At today's visit, she reports feeling fair.  No recent hospitalizations, surgeries, or changes in baseline health status.  She is taking her iron tablet daily.  She reports regular menstrual cycles that are "on the heavier side," but denies any abnormal bleeding such as hematemesis, hematochezia, melena, or epistaxis.  She denies any frequent infections requiring antibiotics, but does report that she has cystic acne with flareups on a monthly basis.  She denies any fever, chills, or night sweats.  She has not noticed any lumps or bumps.  She denies any recent steroid use.  She has had some recent weight loss related to nausea and poor appetite from medication changes.  She has 50% energy and little to no appetite. She endorses that she is maintaining a stable weight.   REVIEW OF SYSTEMS:  Review of Systems  Constitutional:  Positive for appetite change (secondary to new medication) and fatigue. Negative for chills, diaphoresis, fever and unexpected weight change.  HENT:   Negative for lump/mass and nosebleeds.   Eyes:  Negative for eye problems.  Respiratory:  Negative for cough, hemoptysis and shortness of breath.   Cardiovascular:  Negative for chest pain, leg swelling and palpitations.  Gastrointestinal:  Positive for diarrhea and nausea (secondary to new medication). Negative for abdominal pain, blood in stool, constipation and vomiting.  Genitourinary:  Negative for hematuria.   Skin: Negative.    Neurological:  Negative for dizziness, headaches and light-headedness.  Hematological:  Does not bruise/bleed easily.      PAST MEDICAL/SURGICAL HISTORY:  Past Medical History:  Diagnosis Date   Asthma    Broken toe    Carpal tunnel syndrome    Iron deficiency anemia    Migraine    Near syncope Episodic near-syncope   Obesity, unspecified    Obesity   Vertigo    Vitamin D deficiency    Past Surgical History:  Procedure Laterality Date   LAPAROSCOPIC BILATERAL SALPINGECTOMY Bilateral 04/27/2021   Procedure: LAPAROSCOPIC BILATERAL SALPINGECTOMY;  Surgeon: Florian Buff, MD;  Location: [facility];  Service: Gynecology;  Laterality: Bilateral;   TONSILLECTOMY     WISDOM TOOTH EXTRACTION Bilateral      SOCIAL HISTORY:  Social History   Socioeconomic History   Marital status: Married    Spouse name: Not on file   Number of children: 0   Years of education: some college   Highest education level: Not on file  Occupational History   Occupation: Unemployed  Tobacco Use   Smoking status: Never   Smokeless tobacco: Never  Vaping Use   Vaping Use: Never used  Substance and Sexual Activity   Alcohol use: No   Drug use: No   Sexual activity: Yes    Birth control/protection: Pill  Other Topics Concern   Not on file  Social History Narrative   Lives at home with spouse.   Right-handed.    Occasional caffeine use.   Social Determinants of Health   Financial Resource Strain: Low Risk  (02/24/2021)   Overall  Financial Resource Strain (CARDIA)    Difficulty of Paying Living Expenses: Not hard at all  Food Insecurity: No Food Insecurity (02/24/2021)   Hunger Vital Sign    Worried About Running Out of Food in the Last Year: Never true    Ran Out of Food in the Last Year: Never true  Transportation Needs: No Transportation Needs (02/24/2021)   PRAPARE - Hydrologist (Medical): No    Lack of Transportation (Non-Medical): No  Physical Activity:  Insufficiently Active (02/24/2021)   Exercise Vital Sign    Days of Exercise per Week: 3 days    Minutes of Exercise per Session: 20 min  Stress: No Stress Concern Present (02/24/2021)   [place]    Feeling of Stress : Not at all  Social Connections: Moderately Isolated (02/24/2021)   Social Connection and Isolation Panel [NHANES]    Frequency of Communication with Friends and Family: More than three times a week    Frequency of Social Gatherings with Friends and Family: Twice a week    Attends Religious Services: Never    Marine scientist or Organizations: No    Attends Archivist Meetings: Never    Marital Status: Married  Human resources officer Violence: Not At Risk (02/24/2021)   Humiliation, Afraid, Rape, and Kick questionnaire    Fear of Current or Ex-Partner: No    Emotionally Abused: No    Physically Abused: No    Sexually Abused: No    FAMILY HISTORY:  Family History  Problem Relation Age of Onset   Deafness Mother    Diabetes Mother    Diabetes Maternal Grandmother    Brain cancer Maternal Grandmother    Diabetes Maternal Grandfather    Multiple sclerosis Other    Lupus Other     CURRENT MEDICATIONS:  Outpatient Encounter Medications as of 01/04/2022  Medication Sig   acetaminophen (TYLENOL) 325 MG tablet Take 650 mg by mouth every 6 (six) hours as needed for moderate pain.   albuterol (PROVENTIL HFA;VENTOLIN HFA) 108 (90 BASE) MCG/ACT inhaler Inhale 2 puffs into the lungs every 6 (six) hours as needed for wheezing or shortness of breath.    atorvastatin (LIPITOR) 20 MG tablet Take 20 mg by mouth daily.   cetirizine (ZYRTEC) 10 MG tablet Chew 10 mg by mouth daily.   chlorpheniramine (CHLOR-TRIMETON) 4 MG tablet Take 4 mg by mouth in the morning and at bedtime.   Cholecalciferol (VITAMIN D) 50 MCG (2000 UT) tablet Take 2,000 Units by mouth daily.   ergocalciferol (VITAMIN D2) 1.25 MG (50000 UT)  capsule Take 50,000 Units by mouth once a week.   ferrous sulfate 325 (65 FE) MG tablet Take 325 mg by mouth daily with breakfast.   guaifenesin (HUMIBID E) 400 MG TABS tablet Take 400 mg by mouth in the morning and at bedtime.   hyoscyamine (LEVSIN) 0.125 MG tablet Take 0.125 mg by mouth every 6 (six) hours as needed (diarrhea).   ibuprofen (ADVIL,MOTRIN) 800 MG tablet Take 800 mg by mouth at bedtime.   JENCYCLA 0.35 MG tablet Take 1 tablet by mouth once daily   magnesium oxide (MAG-OX) 400 MG tablet Take 400 mg by mouth 2 (two) times daily.   Menthol, Topical Analgesic, (BIOFREEZE EX) Apply 1 application topically daily as needed (pain).   ondansetron (ZOFRAN ODT) 8 MG disintegrating tablet Take 1 tablet (8 mg total) by mouth every 8 (eight) hours as needed  for nausea or vomiting.   Phenylephrine HCl 5 MG TABS Take 5 mg by mouth in the morning and at bedtime.   Potassium 99 MG TABS Take 99 mg by mouth daily.   Riboflavin (VITAMIN B2 PO) Take 100 mg by mouth 2 (two) times daily.   SUMAtriptan (IMITREX) 50 MG tablet Take 1 tablet (50 mg total) by mouth every 2 (two) hours as needed for migraine. May repeat in 2 hours if headache persists or recurs.   topiramate (TOPAMAX) 100 MG tablet Take 1 tablet (100 mg total) by mouth 2 (two) times daily.   venlafaxine XR (EFFEXOR XR) 37.5 MG 24 hr capsule Take 1 capsule (37.5 mg total) by mouth at bedtime.   No facility-administered encounter medications on file as of 01/04/2022.    ALLERGIES:  Allergies  Allergen Reactions   Penicillins     Was told she was allergic but has taken, amoxicillin with no reaction   Desipramine Rash     PHYSICAL EXAM:  ECOG PERFORMANCE STATUS: 1 - Symptomatic but completely ambulatory  There were no vitals filed for this visit. There were no vitals filed for this visit. Physical Exam Constitutional:      Appearance: Normal appearance.     Comments: Morbidly obese body habitus  HENT:     Head: Normocephalic and  atraumatic.     Mouth/Throat:     Mouth: Mucous membranes are moist.  Eyes:     Extraocular Movements: Extraocular movements intact.     Pupils: Pupils are equal, round, and reactive to light.  Cardiovascular:     Rate and Rhythm: Normal rate and regular rhythm.     Pulses: Normal pulses.     Heart sounds: Normal heart sounds.  Pulmonary:     Effort: Pulmonary effort is normal.     Breath sounds: Normal breath sounds.  Abdominal:     General: Bowel sounds are normal.     Palpations: Abdomen is soft.     Tenderness: There is no abdominal tenderness.  Musculoskeletal:        General: No swelling.     Right lower leg: No edema.     Left lower leg: No edema.  Lymphadenopathy:     Cervical: No cervical adenopathy.  Skin:    General: Skin is warm and dry.  Neurological:     General: No focal deficit present.     Mental Status: She is alert and oriented to person, place, and time.  Psychiatric:        Mood and Affect: Mood normal.        Behavior: Behavior normal.      LABORATORY DATA:  I have reviewed the labs as listed.  CBC    Component Value Date/Time   WBC 14.0 (H) 12/28/2021 0958   RBC 4.88 12/28/2021 0958   HGB 13.7 12/28/2021 0958   HGB 13.2 11/07/2019 0821   HCT 42.7 12/28/2021 0958   HCT 40.5 11/07/2019 0821   PLT 488 (H) 12/28/2021 0958   PLT 455 (H) 11/07/2019 0821   MCV 87.5 12/28/2021 0958   MCV 87 11/07/2019 0821   MCH 28.1 12/28/2021 0958   MCHC 32.1 12/28/2021 0958   RDW 14.1 12/28/2021 0958   RDW 12.7 11/07/2019 0821   LYMPHSABS 2.8 12/28/2021 0958   LYMPHSABS 3.4 (H) 11/07/2019 0821   MONOABS 1.2 (H) 12/28/2021 0958   EOSABS 0.2 12/28/2021 0958   EOSABS 0.2 11/07/2019 0821   BASOSABS 0.1 12/28/2021 0958   BASOSABS 0.1  11/07/2019 0821      Latest Ref Rng & Units 04/25/2021    9:07 AM 10/27/2019   10:35 AM 04/15/2019   11:06 AM  CMP  Glucose 70 - 99 mg/dL 75  105  108   BUN 6 - 20 mg/dL '14  10  12   '$ Creatinine 0.44 - 1.00 mg/dL 0.66  0.77   0.87   Sodium 135 - 145 mmol/L 136  138  138   Potassium 3.5 - 5.1 mmol/L 3.7  3.7  3.9   Chloride 98 - 111 mmol/L 106  106  106   CO2 22 - 32 mmol/L '21  23  24   '$ Calcium 8.9 - 10.3 mg/dL 9.5  9.2  9.7   Total Protein 6.5 - 8.1 g/dL 7.5  7.2  7.6   Total Bilirubin 0.3 - 1.2 mg/dL 0.6  0.5  0.3   Alkaline Phos 38 - 126 U/L 62  61  63   AST 15 - 41 U/L '22  17  21   '$ ALT 0 - 44 U/L 23  26  37     DIAGNOSTIC IMAGING:  I have independently reviewed the relevant imaging and discussed with the patient.  ASSESSMENT & PLAN: 1.  Leukocytosis and thrombocytosis: - She was found to have elevated white count and platelet count (normal hemoglobin) since 2013. - She is a lifelong non-smoker.  No prior history of splenectomy.  No history of steroid use.  No known connective tissue disease or rheumatologic disorder. -Testing for myeloproliferative disorders was negative (negative BCR/ABL, JAK2 V617F, JAK2 exon 12-15, CALR, MPL) -ANA was mildly positive with 1 in 160 dilution.  She does not have any clinical signs or symptoms of lupus. - Her white count has occasionally normalized. - She has frequent flareups of her cystic acne, but denies any other frequent infections - She denies any B symptoms such as fever, chills, night sweats, or unintentional weight loss.  No vasomotor symptoms. - Most recent CBC (12/28/2021): WBC 14.0/ANC 9.7/monocytes 1.2, platelets 488, normal Hgb. - Additional labs (12/28/2021): Normal iron, B12, vitamin D.  Normal LDH. - No lymphadenopathy or palpable splenomegaly on exam, although exam was limited secondary to body habitus.   - Differential diagnosis favors reactive leukocytosis in the setting of morbid obesity.  Myeloproliferative work-up was negative. - PLAN: Given her negative MPN work-up and overall lab stability for the past 10 years, we will discharge her from clinic at this time.  She should continue to follow-up with her PCP with CBC checked at least annually, and can be  referred back to [place] in the future if she has persistently elevated WBC >20, or leukocytosis in the setting of unexplained B symptoms or lymphadenopathy.  2.  Iron deficiency: -She is taking iron tablet daily - No anemia on most recent CBC. - Iron studies within target range with ferritin 97 and iron saturation 20% - PLAN: No indication for IV iron.  Given her overall stability on oral iron supplementation, this can be followed by her primary care provider.  She can be referred back to [place] in the future if IV iron infusions are necessary.  3.  Vitamin D deficiency: -Vitamin D deficiency being managed by PCP   All questions were answered. The patient knows to call the clinic with any problems, questions or concerns.  Medical decision making: Low  Time spent on visit: I spent 20 minutes counseling the patient face to face. The total time spent in the appointment was  30 minutes and more than 50% was on counseling.   Harriett Rush, PA-C  01/04/2022 11:38 AM

## 2022-01-04 ENCOUNTER — Inpatient Hospital Stay (HOSPITAL_BASED_OUTPATIENT_CLINIC_OR_DEPARTMENT_OTHER): Payer: BC Managed Care – PPO | Admitting: Physician Assistant

## 2022-01-04 VITALS — BP 138/87 | HR 86 | Temp 98.0°F | Resp 18 | Ht 65.0 in | Wt 315.7 lb

## 2022-01-04 DIAGNOSIS — E611 Iron deficiency: Secondary | ICD-10-CM | POA: Diagnosis not present

## 2022-01-04 DIAGNOSIS — E559 Vitamin D deficiency, unspecified: Secondary | ICD-10-CM | POA: Diagnosis not present

## 2022-01-04 DIAGNOSIS — D75839 Thrombocytosis, unspecified: Secondary | ICD-10-CM

## 2022-01-04 DIAGNOSIS — D72825 Bandemia: Secondary | ICD-10-CM

## 2022-01-04 DIAGNOSIS — D72829 Elevated white blood cell count, unspecified: Secondary | ICD-10-CM | POA: Diagnosis not present

## 2022-01-04 DIAGNOSIS — Z808 Family history of malignant neoplasm of other organs or systems: Secondary | ICD-10-CM | POA: Diagnosis not present

## 2022-01-04 NOTE — Patient Instructions (Signed)
[place] at [facility] Discharge Instructions  You were seen today by Tarri Abernethy PA-C for your elevated white blood cells, elevated platelets, and low iron levels.    ELEVATED WHITE BLOOD CELLS AND PLATELETS: - Your white blood cells and platelets remain mildly elevated, but are stable within the same range for the past 10 years. - We have previously checked for any signs of blood cancer or genetic mutation, and thankfully these test returned as normal! - Your blood cell abnormalities are most likely related to underlying inflammation from obesity. - You do not need to continue to follow-up at the hematology office [facility]  Your primary care doctor can check your blood counts once a year, and you can be referred back to [place] in the future if needed if you have persistently elevated white blood cells >20.  LOW IRON: - Your iron levels have improved.  Continue to take iron tablet once daily. - Your primary care doctor can continue to follow-up on your iron levels in the future.     Thank you for choosing [place] at [facility] to provide your oncology and hematology care.  To afford each patient quality time with our provider, please arrive at least 15 minutes before your scheduled appointment time.   If you have a lab appointment with the [facility] please come in thru the Main Entrance and check in at the main information desk.  You need to re-schedule your appointment should you arrive 10 or more minutes late.  We strive to give you quality time with our providers, and arriving late affects you and other patients whose appointments are after yours.  Also, if you no show three or more times for appointments you may be dismissed from the clinic at the providers discretion.     Again, thank you for choosing [facility].  Our hope is that these requests will decrease the amount of time that you wait  before being seen by our physicians.       _____________________________________________________________  Should you have questions after your visit to [facility], please contact our office at [phone] and follow the prompts.  Our office hours are 8:00 a.m. and 4:30 p.m. Monday - Friday.  Please note that voicemails left after 4:00 p.m. may not be returned until the following business day.  We are closed weekends and major holidays.  You do have access to a nurse 24-7, just call the main number to the clinic [phone] and do not press any options, hold on the line and a nurse will answer the phone.    For prescription refill requests, have your pharmacy contact our office and allow 72 hours.    Due to Covid, you will need to wear a mask upon entering the hospital. If you do not have a mask, a mask will be given to you at the Main Entrance upon arrival. For doctor visits, patients may have 1 support person age 60 or older with them. For treatment visits, patients can not have anyone with them due to social distancing guidelines and our immunocompromised population.

## 2022-03-14 ENCOUNTER — Ambulatory Visit (INDEPENDENT_AMBULATORY_CARE_PROVIDER_SITE_OTHER): Payer: BC Managed Care – PPO | Admitting: Clinical

## 2022-03-14 DIAGNOSIS — F429 Obsessive-compulsive disorder, unspecified: Secondary | ICD-10-CM | POA: Diagnosis not present

## 2022-03-14 DIAGNOSIS — F633 Trichotillomania: Secondary | ICD-10-CM | POA: Diagnosis not present

## 2022-03-14 NOTE — Progress Notes (Unsigned)
                Deloris Mittag, LCSW 
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
## 2022-03-21 ENCOUNTER — Ambulatory Visit (INDEPENDENT_AMBULATORY_CARE_PROVIDER_SITE_OTHER): Payer: BC Managed Care – PPO | Admitting: Clinical

## 2022-03-21 DIAGNOSIS — F429 Obsessive-compulsive disorder, unspecified: Secondary | ICD-10-CM | POA: Diagnosis not present

## 2022-03-21 DIAGNOSIS — F633 Trichotillomania: Secondary | ICD-10-CM
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
## 2022-03-31 ENCOUNTER — Ambulatory Visit (INDEPENDENT_AMBULATORY_CARE_PROVIDER_SITE_OTHER): Payer: BC Managed Care – PPO | Admitting: Clinical

## 2022-03-31 DIAGNOSIS — F429 Obsessive-compulsive disorder, unspecified: Secondary | ICD-10-CM | POA: Diagnosis not present

## 2022-03-31 DIAGNOSIS — F633 Trichotillomania: Secondary | ICD-10-CM | POA: Diagnosis not present

## 2022-03-31 NOTE — Progress Notes (Addendum)
[place] Counselor/Therapist Progress Note  Patient ID: Shannon Brooks, MRN: [id number]    Date: 03/31/22  Time Spent: 2:33  pm - 3:18 pm : 45 Minutes  Treatment Type: Individual Therapy.  Reported Symptoms: Patient stated, "going pretty good" when clinician inquired about symptoms since last session. Patient reported she was not aware of how many times she washes her hands and will put peroxide on her hands. 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   Mental Status Exam: Appearance:  Neat     Behavior: Appropriate  Motor: Normal  Speech/Language:  Clear and Coherent  Affect: Appropriate  Mood: normal  Thought process: tangential  Thought content:   WNL  Sensory/Perceptual disturbances:   WNL  Orientation: oriented to person and place  Attention: Fair  Concentration: Fair  Memory: WNL  Fund of knowledge:  Good  Insight:   Good  Judgment:  Good  Impulse Control: Fair   Risk Assessment: Danger to Self:  No, patient denied current suicidal ideation and homicidal ideation Self-injurious Behavior: No Danger to Others: No Duty to Warn:no Physical Aggression / Violence:No  Access to Firearms a concern: No  Gang Involvement:No   Subjective:  Patient reported difficulty gauging her mood. Patient reported a family member noticed recent improvement in her mood.   Patient completed homework assignment from last session. Patient reported she checks the sink for leaks every time the sink is used due to previous leak under sink. Patient reported she utilizes a pop it beside the sink 3 times versus checking the sink. Patient reported decrease in checking the mail to once a day and uses a magnet on the outside of the mailbox that she moves the front of the mailbox to the side to decrease compulsion. Patient reported she previously forgot to check the mail  and a bill was almost late. Patient identified this situation as a trigger for checking the mail multiples times. Patient reported she washes her hands multiple times after using the bathroom and keeps gloves in bathroom to help decrease frequency of hand washing. Patient identified a history of the door being unlocked as a trigger for checking door lock multiple times. Patient identified a history of catastrophizing in response to situations.   Interventions: Cognitive Behavioral Therapy. Clinician conducted session via Webex video from clinician's home office. Patient provided verbal consent to proceed with telehealth session and participated in session from patient's home. Reviewed patient's homework and explored triggers for obsessive thoughts/compulsions, provided psycho education related to cognitive distortions, explored cognitive distortions patient experienced in response to recent situations. Clinician requested patient maintain thought record for homework.  Diagnosis:  Obsessive-compulsive disorder, Trichotillomania     Plan: Patient is to use CBT and coping skills to help manage decrease in symptoms associated with their diagnosis. 03/31/22 Patient continues to work towards treatment goals    Frequency: Weekly  Modality: individual   Long-term goal:   decrease obsessive-compulsive behaviors to once per week as evidence by decrease in checking sink, mailbox, water heater, AC unit to once per week for at least 3 consecutive months    Target Date: 03/22/23  Progress: progressing   decrease feelings of anger in response to decreased mobility and other medical conditions as evidenced by decrease in feelings of anger and irritability from 3-4 times per month to 1-2 times per month for 3 consecutive months   Target  Date: 03/22/23  Progress: progressing   Short-term goal:  identify triggers and automatic thoughts associated with compulsive behaviors    Target Date: 09/20/22  Progress:  progressing   identify coping strategies to utilize in response to decreased mobility and other medical conditions  Target Date: 09/20/22  Progress: progressing          Katherina Right, LCSW

## 2022-04-10 ENCOUNTER — Ambulatory Visit: Payer: BC Managed Care – PPO | Admitting: Clinical

## 2022-04-11 ENCOUNTER — Encounter: Payer: Self-pay | Admitting: Adult Health

## 2022-04-11 ENCOUNTER — Ambulatory Visit (INDEPENDENT_AMBULATORY_CARE_PROVIDER_SITE_OTHER): Payer: BC Managed Care – PPO | Admitting: Adult Health

## 2022-04-11 VITALS — BP 129/85 | HR 65 | Ht 65.0 in | Wt 315.0 lb

## 2022-04-11 DIAGNOSIS — N921 Excessive and frequent menstruation with irregular cycle: Secondary | ICD-10-CM | POA: Diagnosis not present

## 2022-04-11 DIAGNOSIS — Z3202 Encounter for pregnancy test, result negative: Secondary | ICD-10-CM

## 2022-04-11 DIAGNOSIS — N926 Irregular menstruation, unspecified: Secondary | ICD-10-CM | POA: Diagnosis not present

## 2022-04-11 LAB — POCT URINE PREGNANCY: Preg Test, Ur: NEGATIVE

## 2022-04-11 NOTE — Progress Notes (Signed)
  Subjective:     Patient ID: Shannon Brooks, female   DOB: Oct 17, 1985, 36 y.o.   MRN: [id number]  HPI Shannon Brooks is a 36 year old white female,married, G0P0, in complaining of period every 2 weeks. She had bilateral salpingectomy 04/27/21 and is taking POPs. She has migraines with aura. She sees Dr Krista Blue   Last pap was negative for malignancy and HPV 02/24/21.  PCP is Dr Nevada Crane  Review of Systems Having periods every 2 weeks Has heavy flow, changes cloth pad every 4 hours Has pain with sex at times too  Reviewed past medical,surgical, social and family history. Reviewed medications and allergies.     Objective:   Physical Exam BP 129/85 (BP Location: Left Arm, Patient Position: Sitting, Cuff Size: Normal)   Pulse 65   Ht '5\' 5"'$  (1.651 m)   Wt (!) 315 lb (142.9 kg)   LMP 04/06/2022   BMI 52.42 kg/m  HGB 13.7  12/28/21 UPT is negative    Skin warm and dry.Pelvic: external genitalia is normal in appearance no lesions, vagina: pink,urethra has no lesions or masses noted, cervix:smooth, uterus: normal size, shape and contour, non tender, no masses felt, adnexa: no masses or tenderness noted. Bladder is non tender and no masses felt.   Upstream - 04/11/22 1403       Pregnancy Intention Screening   Does the patient want to become pregnant in the next year? No    Does the patient's partner want to become pregnant in the next year? No    Would the patient like to discuss contraceptive options today? No      Contraception Wrap Up   Current Method Female Sterilization;Oral Contraceptive    End Method Female Sterilization;Oral Contraceptive            Examination chaperoned by Levy Pupa LPN  Assessment:     1. Pregnancy examination or test, negative result  2. Irregular periods Having period every 2 weeks on POP,she is made aware that some of her meds can affect POPs Continue Jencycla 0.35 mg daily, has refills   3. Menorrhagia with irregular cycle Changes cloth pads every 4  hours Given handout on endometrial ablation, to review if wants to proceed,make appt with Dr Elonda Husky     Plan:     Follow up prn

## 2022-04-18 ENCOUNTER — Ambulatory Visit (INDEPENDENT_AMBULATORY_CARE_PROVIDER_SITE_OTHER): Payer: BC Managed Care – PPO | Admitting: Clinical

## 2022-04-18 DIAGNOSIS — E782 Mixed hyperlipidemia: Secondary | ICD-10-CM | POA: Diagnosis not present

## 2022-04-18 DIAGNOSIS — D509 Iron deficiency anemia, unspecified: Secondary | ICD-10-CM | POA: Diagnosis not present

## 2022-04-18 DIAGNOSIS — F429 Obsessive-compulsive disorder, unspecified: Secondary | ICD-10-CM

## 2022-04-18 DIAGNOSIS — F633 Trichotillomania: Secondary | ICD-10-CM | POA: Diagnosis not present

## 2022-04-18 DIAGNOSIS — E559 Vitamin D deficiency, unspecified: Secondary | ICD-10-CM | POA: Diagnosis not present

## 2022-04-18 NOTE — Progress Notes (Addendum)
[place] Counselor/Therapist Progress Note  Patient ID: Shannon Brooks, MRN: [id number]    Date: 04/18/22  Time Spent: 2:35  pm - 3:22 pm : 47 Minutes  Treatment Type: Individual Therapy.  Reported Symptoms: Patient reported no recent changes in mood since last session. Patient reported decreased energy.   Mental Status Exam: Appearance:  Neat     Behavior: Appropriate  Motor: Normal  Speech/Language:  Clear and Coherent  Affect: Appropriate  Mood: normal  Thought process: tangential  Thought content:   WNL  Sensory/Perceptual disturbances:   WNL  Orientation: oriented to person and place  Attention: Good  Concentration: Good  Memory: WNL  Fund of knowledge:  Good  Insight:   Good  Judgment:  Good  Impulse Control: Fair   Risk Assessment: Danger to Self:  No, Patient denied current suicidal ideation Self-injurious Behavior: No Danger to Others: No, Patient denied current homicidal ideation Duty to Warn:no Physical Aggression / Violence:No  Access to Firearms a concern: No  Gang Involvement:No   Subjective:  Patient reported recently experiencing 3 menstrual cycles within the last 6 weeks which caused a decrease in her iron level. Patient reported she followed up with her gynecologist and they recommended patient address the iron deficiency with dietary changes and continue iron supplement. Patient reported when she is unable to fix a situation herself it causes her thoughts "to spiral". Patient reported at times her mood is dependent upon her step-father's current and previous mood/reactions. Patient reported her step-father's mood fluctuates and she does not know what to anticipate when visiting her parents. Patient reported she will leave her parent's home in response to conflict with her step-father. Patient reported setting boundaries with her step-father has improved their relationship. Patient reported if a home improvement project does not goes as  planned, the situation triggers patient to panic and she anticipates her husband will react in the same way her step-father has in the past. Patient reported she is able to utilize coping strategies in response to decrease feelings of panic.    Interventions: Cognitive Behavioral Therapy. Clinician conducted session via Webex video from clinician's office at [facility]. Patient provided verbal consent to proceed with telehealth session and participated in session from patient's home. Reviewed patient's thought record for homework and requested patient continue thought record for homework. Discussed patient's relationship with her step-father and the impact on patient's mood, thoughts, and responses to situation. Discussed step-father's previous responses to home improvement projects and identified automatic negative thoughts patient associates with step-father's previous reactions and home improvement projects. Processed patient's reaction to a recent home improvement project, identified negative thoughts patient experienced in response and challenged thoughts with rational counter statement. Clinician requested patient completed countering negative thoughts worksheet for homework.   Diagnosis:  Obsessive-compulsive disorder, Trichotillomania     Plan: Patient is to use CBT and coping skills to help manage decrease in symptoms associated with their diagnosis.   Frequency: Weekly  Modality: individual   Long-term goal:   decrease obsessive-compulsive behaviors to once per week as evidence by decrease in checking sink, mailbox, water heater, AC unit to once per week for at least 3 consecutive months    Target Date: 03/22/23  Progress: progressing   decrease feelings of anger in response to decreased mobility and other medical conditions as evidenced by decrease in feelings of anger and irritability from 3-4 times per month to 1-2 times per month for 3 consecutive months    Target Date:  03/22/23  Progress: progressing   Short-term goal:  identify triggers and automatic thoughts associated with compulsive behaviors    Target Date: 09/20/22  Progress: progressing   identify coping strategies to utilize in response to decreased mobility and other medical conditions  Target Date: 09/20/22  Progress: progressing                        Katherina Right, LCSW

## 2022-04-24 ENCOUNTER — Ambulatory Visit (INDEPENDENT_AMBULATORY_CARE_PROVIDER_SITE_OTHER): Payer: BC Managed Care – PPO | Admitting: Clinical

## 2022-04-24 DIAGNOSIS — F429 Obsessive-compulsive disorder, unspecified: Secondary | ICD-10-CM

## 2022-04-24 DIAGNOSIS — F633 Trichotillomania: Secondary | ICD-10-CM
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
## 2022-04-24 NOTE — Progress Notes (Deleted)
                Raylyn Speckman, LCSW 

## 2022-04-24 NOTE — Progress Notes (Signed)
                Montgomery Favor, LCSW 

## 2022-04-25 DIAGNOSIS — E782 Mixed hyperlipidemia: Secondary | ICD-10-CM | POA: Diagnosis not present

## 2022-04-25 DIAGNOSIS — D473 Essential (hemorrhagic) thrombocythemia: Secondary | ICD-10-CM | POA: Diagnosis not present

## 2022-04-25 DIAGNOSIS — G43109 Migraine with aura, not intractable, without status migrainosus: Secondary | ICD-10-CM | POA: Diagnosis not present

## 2022-04-25 DIAGNOSIS — D509 Iron deficiency anemia, unspecified: Secondary | ICD-10-CM | POA: Diagnosis not present

## 2022-05-01 ENCOUNTER — Ambulatory Visit (INDEPENDENT_AMBULATORY_CARE_PROVIDER_SITE_OTHER): Payer: BC Managed Care – PPO | Admitting: Clinical

## 2022-05-01 DIAGNOSIS — F633 Trichotillomania: Secondary | ICD-10-CM | POA: Diagnosis not present

## 2022-05-01 DIAGNOSIS — F429 Obsessive-compulsive disorder, unspecified: Secondary | ICD-10-CM

## 2022-05-01 NOTE — Progress Notes (Signed)
[place] Counselor/Therapist Progress Note  Patient ID: Shannon Brooks, MRN: [id number],    Date: 05/01/2022  Time Spent: 8:31am- 9:19am : 48 minutes  Treatment Type: Individual Therapy  Reported Symptoms: Patient reported low energy level and recent increase in sleep   Mental Status Exam: Appearance:  Neat     Behavior: Appropriate  Motor: Normal  Speech/Language:  Clear and Coherent  Affect: Appropriate  Mood: normal  Thought process: tangential  Thought content:   Tangential  Sensory/Perceptual disturbances:   WNL  Orientation: oriented to person, place, and time/date  Attention: Good  Concentration: Good  Memory: WNL  Fund of knowledge:  Good  Insight:   Good  Judgment:  Good  Impulse Control: Fair   Risk Assessment: Danger to Self:  No Patient denied current suicidal ideation Self-injurious Behavior: No Danger to Others: No Patient denied current homicidal ideation Duty to Warn:no Physical Aggression / Violence:No  Access to Firearms a concern: No  Gang Involvement:No   Subjective: Patient reported "ok" mood today. Patient stated, she "wasn't as nice as I could have been" during a recent conversation with her husband to discuss stressors. Patient reported feeling her husband doesn't process their conversations and reported she asked her husband to repeat back to patient what she vocalized during their conversation. Patient reported husband's family exhibits similar communication styles as her husband. Patient reported she utilized CBT techniques previously learned in therapy when communicating with husband. Patient reported a new compulsion of waiting for toilet to fill before leaving the bathroom. Patient reported feeling the compulsion will be time limited due to their current inability to make the repair at this time. Patient reported feeling the compulsion will subside once they are able to repair the toilet. Patient reported homework was more  difficult to complete in the moment while stressors were occurring. Patient reported she refrained from pulling out eye lash hair in response to stressors.   Interventions: Cognitive Behavioral Therapy. Clinician conducted session via Webex video from clinician's home office. Patient provided verbal consent to proceed with telehealth session and participated in session from patient's home. Patient reported her friend was present during today's session and patient provided verbal consent for friend to be present during session. Discussed patient's recent conversations with her husband regarding recent stressors and communication strategies utilized during their conversations. Provided psycho education related to healthy communication strategies. Discussed recent household stressors and the impact on patient's mood and thoughts. Reviewed patient's countering negative thoughts worksheet for homework. Clinician requested patient continue countering negative thoughts activity for homework.    Diagnosis:  Obsessive-compulsive disorder, Trichotillomania     Plan: Patient is to use CBT and coping skills to help manage decrease in symptoms associated with their diagnosis.   Frequency: Weekly  Modality: individual    Long-term goal:   decrease obsessive-compulsive behaviors to once per week as evidence by decrease in checking sink, mailbox, water heater, AC unit to once per week for at least 3 consecutive months    Target Date: 03/22/23  Progress: progressing    decrease feelings of anger in response to decreased mobility and other medical conditions as evidenced by decrease in feelings of anger and irritability from 3-4 times per month to 1-2 times per month for 3 consecutive months   Target Date: 03/22/23  Progress: progressing    Short-term goal:  identify triggers and automatic thoughts associated with compulsive behaviors    Target Date: 09/20/22  Progress: progressing    identify coping  strategies to  utilize in response to decreased mobility and other medical conditions   Target Date: 09/20/22  Progress: progressing    Katherina Right, LCSW

## 2022-05-01 NOTE — Progress Notes (Signed)
                Alexus Michael, LCSW 

## 2022-05-12 ENCOUNTER — Ambulatory Visit (INDEPENDENT_AMBULATORY_CARE_PROVIDER_SITE_OTHER): Payer: BC Managed Care – PPO | Admitting: Clinical

## 2022-05-12 DIAGNOSIS — F633 Trichotillomania: Secondary | ICD-10-CM | POA: Diagnosis not present

## 2022-05-12 DIAGNOSIS — F429 Obsessive-compulsive disorder, unspecified: Secondary | ICD-10-CM | POA: Diagnosis not present

## 2022-05-12 NOTE — Progress Notes (Signed)
[place] Counselor/Therapist Progress Note  Patient ID: Shannon Brooks, MRN: [id number],    Date: 05/12/2022  Time Spent: 2:30pm -  3:21pm : 51 minutes  Treatment Type: Individual Therapy  Reported Symptoms: Patient reported recent fatigue/exhaustion in response to stressors  Mental Status Exam: Appearance:  Neat     Behavior: Appropriate  Motor: Normal  Speech/Language:  Clear and Coherent  Affect: Appropriate  Mood: normal  Thought process: tangential  Thought content:   Tangential  Sensory/Perceptual disturbances:   WNL  Orientation: oriented to person, place, and situation  Attention: Good  Concentration: Good  Memory: WNL  Fund of knowledge:  Good  Insight:   Good  Judgment:  Good  Impulse Control: Fair   Risk Assessment: Danger to Self:  No Patient denied current suicidal ideation Self-injurious Behavior: No Danger to Others: No Patient denied current homicidal ideation Duty to Warn:no Physical Aggression / Violence:No  Access to Firearms a concern: No  Gang Involvement:No   Subjective: Patient reported recently pet sitting for her parents and reported her stepfather thanked patient for giving up her day/activities to pet sit. Patient reported she was shocked when her step father thanked her for pet sitting and stated, "I'll take it", but  reported thinking "what's the catch". Patient reported feeling exhausted and "stressed out" in response to pet sitting. Patient stated she was aware "it was going to be rough" since would not able to wear her noise cancelling headphones. Patient reported she slept most of the day in response to pet sitting. Patient reported pulling her eye brows in response to recent stressors and reported pulling her eye brows is an automatic response due to fatigue/exhaustion. Patient stated, "not too bad" when clinician inquired about current mood. Patient reported utilizing video games to decompress from stressors.    Interventions: Cognitive Behavioral Therapy. Clinician conducted session via Webex video from clinician's home office. Patient provided verbal consent to proceed with telehealth session and participated in session from patient's home. Patient provided verbal consent for her husband, Mitzi Hansen, to be present during session. Reviewed patient's countering negative thoughts journal for homework. Processed patient's emotional, cognitive, and physical response to pet sitting and stepfather's expression of gratitude. Explored and identified triggers for patient pulling her eye brows in response to stressors.  Clinician requested patient continue countering negative thoughts activity for homework.   Diagnosis:  Obsessive-compulsive disorder, Trichotillomania     Plan: Patient is to use CBT and coping skills to help manage decrease in symptoms associated with their diagnosis.   Frequency: Weekly  Modality: individual    Long-term goal:   decrease obsessive-compulsive behaviors to once per week as evidence by decrease in checking sink, mailbox, water heater, AC unit to once per week for at least 3 consecutive months    Target Date: 03/22/23  Progress: progressing    decrease feelings of anger in response to decreased mobility and other medical conditions as evidenced by decrease in feelings of anger and irritability from 3-4 times per month to 1-2 times per month for 3 consecutive months   Target Date: 03/22/23  Progress: progressing    Short-term goal:  identify triggers and automatic thoughts associated with compulsive behaviors    Target Date: 09/20/22  Progress: progressing    identify coping strategies to utilize in response to decreased mobility and other medical conditions   Target Date: 09/20/22  Progress: progressing      Katherina Right, LCSW

## 2022-05-12 NOTE — Progress Notes (Signed)
                Kealie Barrie, LCSW 

## 2022-05-26 ENCOUNTER — Ambulatory Visit: Payer: BC Managed Care – PPO | Admitting: Clinical

## 2022-05-26 DIAGNOSIS — N390 Urinary tract infection, site not specified: Secondary | ICD-10-CM | POA: Diagnosis not present

## 2022-06-09 ENCOUNTER — Ambulatory Visit (INDEPENDENT_AMBULATORY_CARE_PROVIDER_SITE_OTHER): Payer: BC Managed Care – PPO | Admitting: Clinical

## 2022-06-09 DIAGNOSIS — F633 Trichotillomania: Secondary | ICD-10-CM

## 2022-06-09 DIAGNOSIS — F429 Obsessive-compulsive disorder, unspecified: Secondary | ICD-10-CM | POA: Diagnosis not present

## 2022-06-09 NOTE — Progress Notes (Signed)
                Ronne Savoia, LCSW 
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
## 2022-06-20 ENCOUNTER — Encounter: Payer: Self-pay | Admitting: Neurology

## 2022-06-20 ENCOUNTER — Ambulatory Visit (INDEPENDENT_AMBULATORY_CARE_PROVIDER_SITE_OTHER): Payer: BC Managed Care – PPO | Admitting: Neurology

## 2022-06-20 VITALS — Ht 65.0 in | Wt 314.0 lb

## 2022-06-20 DIAGNOSIS — G43109 Migraine with aura, not intractable, without status migrainosus: Secondary | ICD-10-CM | POA: Diagnosis not present

## 2022-06-20 DIAGNOSIS — R42 Dizziness and giddiness: Secondary | ICD-10-CM | POA: Diagnosis not present

## 2022-06-20 MED ORDER — TOPIRAMATE 100 MG PO TABS: 100.0000 mg | ORAL_TABLET | Freq: Two times a day (BID) | ORAL | 3 refills | Status: DC

## 2022-06-20 MED ORDER — QULIPTA 60 MG PO TABS: 60.0000 mg | ORAL_TABLET | Freq: Every day | ORAL | 5 refills | Status: DC

## 2022-06-20 NOTE — Patient Instructions (Addendum)
Let's try Qulipta, 1 tablet daily for migraine prevention  Continue the Topamax, use Imitrex as needed See you back in 6 months

## 2022-06-20 NOTE — Progress Notes (Signed)
PATIENT: Crystalee Dirks DOB: August 08, 1985  REASON FOR VISIT: follow up for chronic migraine headache HISTORY FROM: patient PRIMARY NEUROLOGIST: Dr. Krista Blue   HISTORY Arieonna Sisk is a 36 years old female, seen in refer by his primary care doctor Celene Squibb for evaluation of dizziness, initial evaluation was on April 10 2017.   I reviewed and summarized the referring note, she had a past medical history of chronic migraine, obese, presented with frequent vertigo with associated headache since 2017    She had migraine headaches since 36 years old, her typical migraine are lateralized severe pounding headache with associated light noise sensitivity, nauseous, lasting for a few hours, was helped by caffeine, over-the-counter Excedrin Migraine, Tylenol ibuprofen as needed, sometimes her migraines are preceded by auras, visual distortion, dizziness,   She also complains of intermittent vertigo episodes since 2013, gradually getting worse especially since 2017, over the past few months, she has it almost on a weekly basis, sudden onset of unsteady gait, difficulty focusing, brain foggy sensation, lasting for few hours, caffeine usually helps her symptoms temporarily, oftentimes is followed by severe migraine headaches,   Stopping birth control in March 2018 seems to make her symptoms worse    Update July 10, 2017: She is now taking Topamax 100 mg twice a day, doing very well, lost 11 pounds over past 3 months, Imitrex as needed works well for her migraine headaches, and recurrent dizzy spells, continue have recurrent spells of lightheadedness, whole body tremor, without loss consciousness lasting for a few minutes, not necessarily associated with headaches,   UPDATE September 30 2018: She is over all happy with current migraine control, she is taking Topamax '200mg'$  daily, magnesium oxide, riboflavin '100mg'$  twice a day as preventive medications.     She has migraine once a week,  Sometimes  precede by dizziness, sleeping would help.     Imitrex '50mg'$  as needed was helpful.   She also came with a list of constellation of complaints, frequent body tremor, disturbance spells, staring at the computer screen,Watching TV, increased brain fog   EEG was normal in Dec 2018   Update Dec 17, 2019 SS: Overall happy with current migraine control, taking Topamax 100 mg twice a day, magnesium oxide, riboflavin for preventative medications.  Has headache with pain about once a month, will take Imitrex with good benefit.  Has daily dizziness/vertigo vision, "wake up on the boat", it may worsen throughout the day.  She carries a cane just in case.  She is now on disability.  She may have occasional sensation of cold or heat to crown of head during spells.  Daily dizzy spells have been chronic.  Here with husband, not planning to start a family currently.  Has close follow-up with PCP, found to have low vitamin D.  Topamax makes symptoms manageable.  Update Dec 21, 2020 SS: Sees hematology at [facility] for thrombocytosis, vitamin D level has been low, on prescription strength, did trial off Vitamin D, noticed significant worsening in memory, gained 13 lbs, was sleeping a lot. Is back on D2, feeling great now. Rare migraines, noted significant worsening in vertigo when off the D2. Only 4 Imitrex a year, rapid onset, notes worst with menstrual cycle and barometric pressure change. Has dizziness from minute she gets up, topamax "mutes it". Some visual hallucinations rare, could be migraine or side effect from Topamax? Doesn't drive further than 5 minutes from home. Also on magnesium/riboflavin. Has cane if needed.  Update Dec 14, 2021 SS:  Remains on Topamax 100 mg twice daily, typically once a month gets migraine with pain, barometric pressure/hormones are triggers. Had fallopian tubes removed in October, still on progesterone birth control for hormones. Without Topamax, is very dizzy, immobile. Takes Imitrex at onset  of headache with good benefit. In past on Paxil. Still has dizziness daily that is part of her headache process, really impacts her life, ability to be independent.   Update June 20, 2022 SS: Had side effect with Effexor, remains on Topamax 100 mg twice daily. Is in a good period of time, this a a 4 week good period before rainy weather season. Takes Imitrex once a month for headache, it helps. Has daily dizzy aura.   REVIEW OF SYSTEMS: Out of a complete 14 system review of symptoms, the patient complains only of the following symptoms, and all other reviewed systems are negative.  See HPI  ALLERGIES: Allergies  Allergen Reactions   Penicillins     Was told she was allergic but has taken, amoxicillin with no reaction   Desipramine Rash    HOME MEDICATIONS: Outpatient Medications Prior to Visit  Medication Sig Dispense Refill   acetaminophen (TYLENOL) 325 MG tablet Take 650 mg by mouth every 6 (six) hours as needed for moderate pain.     albuterol (PROVENTIL HFA;VENTOLIN HFA) 108 (90 BASE) MCG/ACT inhaler Inhale 2 puffs into the lungs every 6 (six) hours as needed for wheezing or shortness of breath.      atorvastatin (LIPITOR) 20 MG tablet Take 20 mg by mouth daily.     cetirizine (ZYRTEC) 10 MG tablet Chew 10 mg by mouth daily.     chlorpheniramine (CHLOR-TRIMETON) 4 MG tablet Take 4 mg by mouth in the morning and at bedtime.     Cholecalciferol (VITAMIN D) 50 MCG (2000 UT) tablet Take 2,000 Units by mouth daily.     ergocalciferol (VITAMIN D2) 1.25 MG (50000 UT) capsule Take 50,000 Units by mouth once a week.     ferrous sulfate 325 (65 FE) MG tablet Take 325 mg by mouth daily with breakfast.     guaifenesin (HUMIBID E) 400 MG TABS tablet Take 400 mg by mouth in the morning and at bedtime.     hyoscyamine (LEVSIN) 0.125 MG tablet Take 0.125 mg by mouth every 6 (six) hours as needed (diarrhea).     ibuprofen (ADVIL,MOTRIN) 800 MG tablet Take 800 mg by mouth at bedtime.     JENCYCLA  0.35 MG tablet Take 1 tablet by mouth once daily 28 tablet 11   magnesium oxide (MAG-OX) 400 MG tablet Take 400 mg by mouth 2 (two) times daily.     Menthol, Topical Analgesic, (BIOFREEZE EX) Apply 1 application topically daily as needed (pain).     Phenylephrine HCl 5 MG TABS Take 5 mg by mouth in the morning and at bedtime.     Potassium 99 MG TABS Take 99 mg by mouth daily.     Riboflavin (VITAMIN B2 PO) Take 100 mg by mouth 2 (two) times daily.     SUMAtriptan (IMITREX) 50 MG tablet Take 1 tablet (50 mg total) by mouth every 2 (two) hours as needed for migraine. May repeat in 2 hours if headache persists or recurs. 12 tablet 11   topiramate (TOPAMAX) 100 MG tablet Take 1 tablet (100 mg total) by mouth 2 (two) times daily. 180 tablet 3   No facility-administered medications prior to visit.    PAST MEDICAL HISTORY: Past Medical History:  Diagnosis Date  Asthma    Broken toe    Carpal tunnel syndrome    Iron deficiency anemia    Migraine    Near syncope Episodic near-syncope   Obesity, unspecified    Obesity   OCD (obsessive compulsive disorder)    Vertigo    Vitamin D deficiency     PAST SURGICAL HISTORY: Past Surgical History:  Procedure Laterality Date   LAPAROSCOPIC BILATERAL SALPINGECTOMY Bilateral 04/27/2021   Procedure: LAPAROSCOPIC BILATERAL SALPINGECTOMY;  Surgeon: Florian Buff, MD;  Location: [facility];  Service: Gynecology;  Laterality: Bilateral;   TONSILLECTOMY     WISDOM TOOTH EXTRACTION Bilateral     FAMILY HISTORY: Family History  Problem Relation Age of Onset   Deafness Mother    Diabetes Mother    Diabetes Maternal Grandmother    Brain cancer Maternal Grandmother    Diabetes Maternal Grandfather    Multiple sclerosis Other    Lupus Other     SOCIAL HISTORY: Social History   Socioeconomic History   Marital status: Married    Spouse name: Not on file   Number of children: 0   Years of education: some college   Highest education level: Not on  file  Occupational History   Occupation: Unemployed  Tobacco Use   Smoking status: Never   Smokeless tobacco: Never  Vaping Use   Vaping Use: Never used  Substance and Sexual Activity   Alcohol use: No   Drug use: No   Sexual activity: Yes    Birth control/protection: Pill, Surgical    Comment: tubal  Other Topics Concern   Not on file  Social History Narrative   Lives at home with spouse.   Right-handed.    Occasional caffeine use.   Social Determinants of Health   Financial Resource Strain: Low Risk  (02/24/2021)   Overall Financial Resource Strain (CARDIA)    Difficulty of Paying Living Expenses: Not hard at all  Food Insecurity: No Food Insecurity (02/24/2021)   Hunger Vital Sign    Worried About Running Out of Food in the Last Year: Never true    [place] in the Last Year: Never true  Transportation Needs: No Transportation Needs (02/24/2021)   PRAPARE - Hydrologist (Medical): No    Lack of Transportation (Non-Medical): No  Physical Activity: Insufficiently Active (02/24/2021)   Exercise Vital Sign    Days of Exercise per Week: 3 days    Minutes of Exercise per Session: 20 min  Stress: No Stress Concern Present (02/24/2021)   [place]    Feeling of Stress : Not at all  Social Connections: Moderately Isolated (02/24/2021)   Social Connection and Isolation Panel [NHANES]    Frequency of Communication with Friends and Family: More than three times a week    Frequency of Social Gatherings with Friends and Family: Twice a week    Attends Religious Services: Never    Marine scientist or Organizations: No    Attends Archivist Meetings: Never    Marital Status: Married  Human resources officer Violence: Not At Risk (02/24/2021)   Humiliation, Afraid, Rape, and Kick questionnaire    Fear of Current or Ex-Partner: No    Emotionally Abused: No    Physically Abused: No     Sexually Abused: No  PHYSICAL EXAM  Vitals:   06/20/22 1347  Weight: (!) 314 lb (142.4 kg)  Height: '5\' 5"'$  (1.651 m)  Body mass index is 52.25 kg/m.  Generalized: Well developed, in no acute distress , obese  Neurological examination  Mentation: Alert oriented to time, place, history taking. Follows all commands speech and language fluent Cranial nerve II-XII: Pupils were equal round reactive to light. Extraocular movements were full, visual field were full on confrontational test. Facial sensation and strength were normal. Head turning and shoulder shrug were normal and symmetric. Motor: The motor testing reveals 5 over 5 strength of all 4 extremities. Good symmetric motor tone is noted throughout.  Sensory: Sensory testing is intact to soft touch on all 4 extremities. No evidence of extinction is noted.  Coordination: Cerebellar testing reveals good finger-nose-finger and heel-to-shin bilaterally.  Gait and station: Has to push off to stand, steady, has single point cane if needed  DIAGNOSTIC DATA (LABS, IMAGING, TESTING) - I reviewed patient records, labs, notes, testing and imaging myself where available.  Lab Results  Component Value Date   WBC 14.0 (H) 12/28/2021   HGB 13.7 12/28/2021   HCT 42.7 12/28/2021   MCV 87.5 12/28/2021   PLT 488 (H) 12/28/2021      Component Value Date/Time   NA 136 04/25/2021 0907   K 3.7 04/25/2021 0907   CL 106 04/25/2021 0907   CO2 21 (L) 04/25/2021 0907   GLUCOSE 75 04/25/2021 0907   BUN 14 04/25/2021 0907   CREATININE 0.66 04/25/2021 0907   CALCIUM 9.5 04/25/2021 0907   PROT 7.5 04/25/2021 0907   ALBUMIN 4.0 04/25/2021 0907   AST 22 04/25/2021 0907   ALT 23 04/25/2021 0907   ALKPHOS 62 04/25/2021 0907   BILITOT 0.6 04/25/2021 0907   GFRNONAA >60 04/25/2021 0907   GFRAA >60 10/27/2019 1035   No results found for: "CHOL", "HDL", "LDLCALC", "LDLDIRECT", "TRIG", "CHOLHDL" No results found for: "HGBA1C" Lab Results  Component  Value Date   VITAMINB12 630 12/28/2021   Lab Results  Component Value Date   TSH 1.410 11/07/2019    ASSESSMENT AND PLAN 36 y.o. year old female  1.  Complicated migraine headache with dizziness -Try Qulipta 60 mg daily for migraine prevention  -Continue Topamax 100 mg twice daily -take Imitrex as needed for acute migraine treatment -Tried and Failed:Effexor, wouldn't use BB due to asthma -She wants to hold off of CGRP injection due to concern for side effect -Reach out via my chart if needed, see her  back in 6 months   Meds ordered this encounter  Medications   Atogepant (QULIPTA) 60 MG TABS    Sig: Take 60 mg by mouth daily. Take 1 tablet daily    Dispense:  30 tablet    Refill:  5   topiramate (TOPAMAX) 100 MG tablet    Sig: Take 1 tablet (100 mg total) by mouth 2 (two) times daily.    Dispense:  180 tablet    Refill:  3   Diar Berkel lack, AGNP-C, DNP 06/20/2022, 2:16 PM [facility] [address] [place], [place] [address] [phone]

## 2022-06-30 ENCOUNTER — Ambulatory Visit (INDEPENDENT_AMBULATORY_CARE_PROVIDER_SITE_OTHER): Payer: BC Managed Care – PPO | Admitting: Clinical

## 2022-06-30 DIAGNOSIS — F633 Trichotillomania: Secondary | ICD-10-CM | POA: Diagnosis not present

## 2022-06-30 DIAGNOSIS — F429 Obsessive-compulsive disorder, unspecified: Secondary | ICD-10-CM | POA: Diagnosis not present

## 2022-06-30 NOTE — Progress Notes (Signed)
[place] Counselor/Therapist Progress Note  Patient ID: Shannon Brooks, MRN: [id number],    Date: 06/30/2022  Time Spent: 1:31pm - 2:23pm : 52 minutes   Treatment Type: Individual Therapy  Reported Symptoms: Patient reported recent pulling the hair in her eye lashes out in response to stressors  Mental Status Exam: Appearance:  Neat     Behavior: Patient was making soap during session  Motor: Normal  Speech/Language:  Clear and Coherent  Affect: Appropriate  Mood: normal  Thought process: tangential  Thought content:   Tangential  Sensory/Perceptual disturbances:   WNL  Orientation: oriented to person, place, and situation  Attention: Fair  Concentration: Fair  Memory: [place] of knowledge:  Good  Insight:   Good  Judgment:  Good  Impulse Control: Fair   Risk Assessment: Danger to Self:  No Patient denied current suicidal ideation Self-injurious Behavior: No Danger to Others: No Patient denied current homicidal ideation Duty to Warn:no Physical Aggression / Violence:No  Access to Firearms a concern: No  Gang Involvement:No   Subjective: Patient stated, "its been crazy" since last session due to making soap for presents, her husband being sick, and medical bills.  Patient reported frustration in response to medical billing issue.  Patient reported feeling tired in response to recent stressors. Patient reported she pulled the hair out of her eye lashes in response to recent stressors. Patient reported increased stressors around the holidays due to crafting needs. Patient reported she has been playing hand held video games "to keep myself sane".  Patient reported she tries to keep a craft in her hands to prevent patient from pulling her hair. In addition, patient reported utilizing stress balls to prevent patient from pulling her hair. Patient reported her mood has been "up and down". Patient stated "not as good as I would like but not as bad as they were  last year" in regards to obsessive thoughts and compulsions.   Interventions: Cognitive Behavioral Therapy. Clinician conducted session via WebEx video from clinician's home office. Patient provided verbal consent to proceed with telehealth session and participated in session from patient's home. Provided supportive therapy, active listening, and validation as patient discussed her frustration regarding medical billing issue. Provided education as it relates to patient's financial responsibility for treatment. Discussed recent stressors,  identified coping mechanisms patient is utilizing in response to stressors, and explored additional coping mechanisms.    Diagnosis:  Obsessive-compulsive disorder, Trichotillomania   Plan: Patient is to use CBT and coping skills to help manage decrease in symptoms associated with their diagnosis.   Frequency: Weekly  Modality: individual    Long-term goal:   decrease obsessive-compulsive behaviors to once per week as evidence by decrease in checking sink, mailbox, water heater, AC unit to once per week for at least 3 consecutive months    Target Date: 03/22/23  Progress: progressing    decrease feelings of anger in response to decreased mobility and other medical conditions as evidenced by decrease in feelings of anger and irritability from 3-4 times per month to 1-2 times per month for 3 consecutive months   Target Date: 03/22/23  Progress: progressing    Short-term goal:  identify triggers and automatic thoughts associated with compulsive behaviors    Target Date: 09/20/22  Progress: progressing    identify coping strategies to utilize in response to decreased mobility and other medical conditions   Target Date: 09/20/22  Progress: progressing    Katherina Right, LCSW

## 2022-06-30 NOTE — Progress Notes (Signed)
                Laurali Goddard, LCSW 

## 2022-07-31 ENCOUNTER — Ambulatory Visit (INDEPENDENT_AMBULATORY_CARE_PROVIDER_SITE_OTHER): Payer: BC Managed Care – PPO | Admitting: Clinical

## 2022-07-31 DIAGNOSIS — F429 Obsessive-compulsive disorder, unspecified: Secondary | ICD-10-CM | POA: Diagnosis not present

## 2022-07-31 DIAGNOSIS — F633 Trichotillomania: Secondary | ICD-10-CM | POA: Diagnosis not present

## 2022-07-31 NOTE — Progress Notes (Signed)
[place] Counselor/Therapist Progress Note  Patient ID: Shannon Brooks, MRN: [id number],    Date: 07/31/2022  Time Spent: 9:31am - 10:22am : 51 minutes   Treatment Type: Individual Therapy  Reported Symptoms: Patient reported pulling hair out of her eyebrow since last session.   Mental Status Exam: Appearance:  Neat     Behavior: Appropriate  Motor: Normal  Speech/Language:  Clear and Coherent  Affect: Appropriate  Mood: normal  Thought process: tangential  Thought content:   Tangential  Sensory/Perceptual disturbances:   WNL  Orientation: oriented to person, place, and situation  Attention: Fair  Concentration: Fair  Memory: WNL  Fund of knowledge:  Good  Insight:   Good  Judgment:  Good  Impulse Control: Fair   Risk Assessment: Danger to Self:  No Patient denied current suicidal ideation Self-injurious Behavior: No Danger to Others: No Patient denied current homicidal ideation Duty to Warn:no Physical Aggression / Violence:No  Access to Firearms a concern: No  Gang Involvement:No   Subjective: Patient reported her mood fluctuated during the holiday season due to stressors related to preparing for the holidays. Patient reported plans to drive herself to a weekly knitting group today but feels the [place] where the group meets is outside of her comfort area. Patient reported she doesn't like to drive far from home due to vertigo diagnosis and reported she prefers to be close to home in the event she needs her parents to pick her up. Patient reported she has identified several safe areas to pull over if needed when driving to knitting group if experiencing symptoms of vertigo. Patient reported the knitting group is her only opportunity for face to face social interaction during the week. Patient reported riding with her friend to the knitting group is "a bonus" but reported friend is not always able to provide transportation.  Patient reported she feels the  [place] pandemic opened up more virtual opportunities for individuals who are homebound to socialized with others. Patient reported she attends virtual concerts and participates in online religious communities. Patient reported she would be open to carpooling to the knitting group with someone if that was an option. Patient reported she utilizes some OCD symptoms to manage her finances and utilizes symptoms of ADHD to distract from symptoms of OCD.    Interventions: Cognitive Behavioral Therapy. Clinician conducted session via WebEx video from clinician's home office. Patient provided verbal consent to proceed with telehealth session and participated in session from patient's home. Discussed changes in patient's mood during the holidays and triggers for changes in mood. Explored and identified barriers to patient driving and accessing transportation. Explored patient's social network and identified barriers to socialization. Discussed transportation resources to increase patient's independence, such as, carpooling, [employer] bus. Discussed ways in which patient utilizes some symptoms of OCD and ADHD to manage certain areas of her life.  Diagnosis:  Obsessive-compulsive disorder, Trichotillomania   Plan: Patient is to use CBT and coping skills to help manage decrease in symptoms associated with their diagnosis.   Frequency: Weekly  Modality: individual    Long-term goal:   decrease obsessive-compulsive behaviors to once per week as evidence by decrease in checking sink, mailbox, water heater, AC unit to once per week for at least 3 consecutive months    Target Date: 03/22/23  Progress: progressing    decrease feelings of anger in response to decreased mobility and other medical conditions as evidenced by decrease in feelings of anger and irritability from 3-4 times per month to  1-2 times per month for 3 consecutive months   Target Date: 03/22/23  Progress: progressing    Short-term goal:  identify  triggers and automatic thoughts associated with compulsive behaviors    Target Date: 09/20/22  Progress: progressing    identify coping strategies to utilize in response to decreased mobility and other medical conditions   Target Date: 09/20/22  Progress: progressing    Katherina Right, LCSW

## 2022-07-31 NOTE — Progress Notes (Signed)
                Dezra Mandella, LCSW 

## 2022-08-21 ENCOUNTER — Ambulatory Visit (INDEPENDENT_AMBULATORY_CARE_PROVIDER_SITE_OTHER): Payer: BC Managed Care – PPO | Admitting: Clinical

## 2022-08-21 DIAGNOSIS — F429 Obsessive-compulsive disorder, unspecified: Secondary | ICD-10-CM

## 2022-08-21 DIAGNOSIS — F633 Trichotillomania: Secondary | ICD-10-CM

## 2022-08-21 NOTE — Progress Notes (Signed)
                Katherina Right, LCSW

## 2022-08-21 NOTE — Progress Notes (Signed)
[facility] Counselor/Therapist Progress Note  Patient ID: Shannon Brooks, MRN: [id number],    Date: 08/21/2022  Time Spent: 9:32am - 10:27am : 55 minutes   Treatment Type: Individual Therapy  Reported Symptoms: Patient reported anxiety and pulling eye lashes in response to recent stressors.   Mental Status Exam: Appearance:  Well Groomed     Behavior: Appropriate  Motor: Normal  Speech/Language:  Clear and Coherent  Affect: Appropriate  Mood: normal  Thought process: circumstantial  Thought content:   WNL  Sensory/Perceptual disturbances:   WNL  Orientation: oriented to person, place, and situation  Attention: Good  Concentration: Good  Memory: WNL  Fund of knowledge:  Good  Insight:   Good  Judgment:  Good  Impulse Control: Fair   Risk Assessment: Danger to Self:  No Patient denied current suicidal ideation Self-injurious Behavior: No Danger to Others: No Patient denied current homicidal ideation Duty to Warn:no Physical Aggression / Violence:No  Access to Firearms a concern: No  Gang Involvement:No   Subjective: Patient reported concerns regarding her husband's job and reported husband has been placed on medical probation. Patient reported she is upset with her husband for not following up with medical care and maintaining his physical health. Patient reported she doesn't want to be put in the position to "mother" her husband. Patient reported concern that her husband is not committed to his health and reported concern about the impact the medical probation will have on their financial health. Patient reported she is trying to be supportive of her husband in making lifestyle changes, is modeling healthy choices, and validates husband's efforts. Patient stated, "I'm not in panic mode over it but I would feel better if I see more agency from him". Patient reported she has committed to a large project in her stitch group and a large project for her mother's  birthday in response to recent stressor. Patient reported pulling out her eye lashes in responds to recent stressor but reported a decrease in the amount of hair pulled in response. Patient reported a history of experiencing intrusive thoughts to drive car off road and key her father's car, but denied intent or plan to follow through with thoughts.   Interventions: Cognitive Behavioral Therapy.  Clinician conducted session via WebEx video from clinician's home office. Patient provided verbal consent to proceed with telehealth session and participated in session from patient's home. Patient provided verbal consent for her friend, Leafy Ro, to be present during session. Provided supportive therapy, active and reflective listening, and validation as patient discussed recent stressor regarding husband's employment. Processed patient's thoughts/feelings in response to the stressor. Explored and identified coping strategies for patient to utilize in response to stressors, such as, play a game on her gaming system, take a nap, crocheting, making soap. Assessed additional symptoms reported by patient.   Diagnosis:  Obsessive-compulsive disorder, Trichotillomania   Plan: Patient is to use CBT and coping skills to help manage decrease in symptoms associated with their diagnosis.   Frequency: Weekly  Modality: individual    Long-term goal:   decrease obsessive-compulsive behaviors to once per week as evidence by decrease in checking sink, mailbox, water heater, AC unit to once per week for at least 3 consecutive months    Target Date: 03/22/23  Progress: progressing    decrease feelings of anger in response to decreased mobility and other medical conditions as evidenced by decrease in feelings of anger and irritability from 3-4 times per month to 1-2 times per month for 3  consecutive months   Target Date: 03/22/23  Progress: progressing    Short-term goal:  identify triggers and automatic thoughts associated  with compulsive behaviors    Target Date: 09/20/22  Progress: progressing    identify coping strategies to utilize in response to decreased mobility and other medical conditions   Target Date: 09/20/22  Progress: progressing    Katherina Right, LCSW

## 2022-09-04 ENCOUNTER — Ambulatory Visit (INDEPENDENT_AMBULATORY_CARE_PROVIDER_SITE_OTHER): Payer: BC Managed Care – PPO | Admitting: Clinical

## 2022-09-04 DIAGNOSIS — F429 Obsessive-compulsive disorder, unspecified: Secondary | ICD-10-CM | POA: Diagnosis not present

## 2022-09-04 DIAGNOSIS — F633 Trichotillomania: Secondary | ICD-10-CM | POA: Diagnosis not present

## 2022-09-04 NOTE — Progress Notes (Signed)
                Kaseem Vastine, LCSW 

## 2022-09-04 NOTE — Progress Notes (Signed)
[facility] Counselor/Therapist Progress Note  Patient ID: Shannon Brooks, MRN: [id number],    Date: 09/04/2022  Time Spent: 9:31am - 10:28am : 57 minutes  Treatment Type: Individual Therapy  Reported Symptoms: Patient stated, "up and down" in response to mood since last session.   Mental Status Exam: Appearance:  Well Groomed     Behavior: Appropriate  Motor: Normal  Speech/Language:  Clear and Coherent  Affect: Appropriate  Mood: normal  Thought process: tangential  Thought content:   Tangential  Sensory/Perceptual disturbances:   WNL  Orientation: oriented to person, place, and situation  Attention: Good  Concentration: Good  Memory: WNL  Fund of knowledge:  Good  Insight:   Good  Judgment:  Good  Impulse Control: Fair   Risk Assessment: Danger to Self:  No Patient denied current suicidal ideation Self-injurious Behavior: No Danger to Others: No Patient denied current homicidal ideation Duty to Warn:no Physical Aggression / Violence:No  Access to Firearms a concern: No  Gang Involvement:No   Subjective: Patient reported recent stressor related to obtaining a new phone and difficulties throughout the process. 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 Patient stated, "I do lose a little patience after I've said no". Patient reported her husband has completed additional medical requirements in response to medical probation. Patient reported her husband has to change his diet in response to recent medical results and patient reported she is trying to support husband's dietary changes. Patient reported feeling her husband is committed to dietary changes but concerned about his commitment related to physical activity. 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 Patient reported she  participated in retail therapy and has been utilizing video games to cope with stressors. Patient reported she had a conversation with her husband regarding their finances and her concerns. Patient stated, "up and down" in response to the impact of stressors on patient's mood. Patient reported she has not pulled the hair on her eye lashes or eye brows since last session.   Interventions: Cognitive Behavioral Therapy. Clinician conducted session via WebEx video from clinician's home office. Patient provided verbal consent to proceed with telehealth session and participated in session from patient's home. Clinician provided supportive therapy, active and reflective listening, and validation as patient discussed recent stressors related to obtaining a new phone and status of husband's medical probation. Processed recent stressors, patient's thoughts/feelings in response, and the outcome of coping strategies patient utilized in response to stressors. Validated patient's use of coping strategies and patient refraining from pulling her hair in response to stressors.     Diagnosis:  Obsessive-compulsive disorder, Trichotillomania   Plan: Patient is to use CBT and coping skills to help manage decrease in symptoms associated with their diagnosis.   Frequency: Weekly  Modality: individual    Long-term goal:   decrease obsessive-compulsive behaviors to once per week as evidence by decrease in checking sink, mailbox, water heater, AC unit to once per week for at least 3 consecutive months    Target Date: 03/22/23  Progress: progressing    decrease feelings of anger in response to decreased mobility and other medical conditions as evidenced by decrease in feelings of anger and irritability from 3-4 times per month to 1-2 times per month for 3 consecutive months   Target Date: 03/22/23  Progress: progressing  Short-term goal:  identify triggers and automatic thoughts associated with compulsive behaviors     Target Date: 09/20/22  Progress: progressing    identify coping strategies to utilize in response to decreased mobility and other medical conditions   Target Date: 09/20/22  Progress: progressing     Katherina Right, LCSW

## 2022-09-18 ENCOUNTER — Ambulatory Visit (INDEPENDENT_AMBULATORY_CARE_PROVIDER_SITE_OTHER): Payer: BC Managed Care – PPO | Admitting: Clinical

## 2022-09-18 DIAGNOSIS — F633 Trichotillomania: Secondary | ICD-10-CM

## 2022-09-18 DIAGNOSIS — F429 Obsessive-compulsive disorder, unspecified: Secondary | ICD-10-CM

## 2022-09-18 NOTE — Progress Notes (Signed)
[facility] Counselor/Therapist Progress Note  Patient ID: Shannon Brooks, MRN: [id number],    Date: 09/18/2022  Time Spent: 9:35am - 10:22am : 47 minutes   Treatment Type: Individual Therapy  Reported Symptoms: Patient reported recent feelings of anxiety  Mental Status Exam: Appearance:  Casual     Behavior: Appropriate  Motor: Normal  Speech/Language:  Clear and Coherent  Affect: Appropriate  Mood: anxious  Thought process: tangential  Thought content:   Tangential  Sensory/Perceptual disturbances:   WNL  Orientation: oriented to person, place, and situation  Attention: Good  Concentration: Good  Memory: WNL  Fund of knowledge:  Good  Insight:   Good  Judgment:  Good  Impulse Control: Fair   Risk Assessment: Danger to Self:  No Patient denied current suicidal ideation Self-injurious Behavior: No Danger to Others: No Patient denied current homicidal ideation Duty to Warn:no Physical Aggression / Violence:No  Access to Firearms a concern: No  Gang Involvement:No   Subjective: Patient reported she discontinued the project for her mother's birthday, but reported the other project she is working on is going well. Patient stated, "phenomenally well" in response to the status of her husband making changes to address his health concerns and medical probation. Patient reported her husband has been reading food labels and keeping a food diary. Patient reported concern related to the results of her husband's sleep study and DOT's upcoming decision regarding her husband's re certification. Patient reported feeling nervous due to a previous experience with regarding a DOT decision. Patient reported concern regarding DOT's upcoming interpretation of her husband's sleep study results. Patient reported she tells herself "it will be fine" in response to husband's medical probation. Patient reported she is crocheting to cope with stress and anxiety.   Interventions:  Cognitive Behavioral Therapy. Clinician conducted session via WebEx video from clinician's home office. Patient provided verbal consent to proceed with telehealth session and participated in session from patient's home. Clinician provided supportive therapy, active and reflective listening, and validation as patient discussed the status of recent stressors. Explored potential strategies in response to stressors. Processed patient's thoughts/feelings in response to stressors and the impact on patient's physical and mental health. Explored and identified coping strategies patient has been utilizing in response to recent stress/anxiety and validated patient's use of coping strategies.    Diagnosis:  Obsessive-compulsive disorder, Trichotillomania   Plan: Patient is to use CBT and coping skills to help manage decrease in symptoms associated with their diagnosis.   Frequency: Weekly  Modality: individual    Long-term goal:   decrease obsessive-compulsive behaviors to once per week as evidence by decrease in checking sink, mailbox, water heater, AC unit to once per week for at least 3 consecutive months    Target Date: 03/22/23  Progress: progressing    decrease feelings of anger in response to decreased mobility and other medical conditions as evidenced by decrease in feelings of anger and irritability from 3-4 times per month to 1-2 times per month for 3 consecutive months   Target Date: 03/22/23  Progress: progressing    Short-term goal:  identify triggers and automatic thoughts associated with compulsive behaviors    Target Date: 09/20/22  Progress: progressing    identify coping strategies to utilize in response to decreased mobility and other medical conditions   Target Date: 09/20/22  Progress: progressing    Katherina Right, LCSW

## 2022-09-18 NOTE — Progress Notes (Signed)
                Shannon Hilgeman, LCSW 

## 2022-10-02 ENCOUNTER — Ambulatory Visit (INDEPENDENT_AMBULATORY_CARE_PROVIDER_SITE_OTHER): Payer: BC Managed Care – PPO | Admitting: Clinical

## 2022-10-02 DIAGNOSIS — F633 Trichotillomania: Secondary | ICD-10-CM

## 2022-10-02 DIAGNOSIS — F422 Mixed obsessional thoughts and acts: Secondary | ICD-10-CM

## 2022-10-02 NOTE — Progress Notes (Signed)
                Ramces Shomaker, LCSW 

## 2022-10-02 NOTE — Progress Notes (Signed)
[facility] Counselor/Therapist Progress Note  Patient ID: Shannon Brooks, MRN: [id number],    Date: 10/02/2022  Time Spent: 9:31am - 10:24am : 53 minutes  Treatment Type: Individual Therapy  Reported Symptoms: patient reported recently pulling hair from her eye lashes in response to stress  Mental Status Exam: Appearance:  Well Groomed     Behavior: Appropriate  Motor: Normal  Speech/Language:  Clear and Coherent  Affect: Appropriate  Mood: normal  Thought process: tangential  Thought content:   Tangential  Sensory/Perceptual disturbances:   WNL  Orientation: oriented to person, place, and situation  Attention: Good  Concentration: Good  Memory: WNL  Fund of knowledge:  Good  Insight:   Good  Judgment:  Good  Impulse Control: Fair   Risk Assessment: Danger to Self:  No Patient denied current suicidal ideation  Self-injurious Behavior: No Danger to Others: No Patient denied current homicidal ideation  Duty to Warn:no Physical Aggression / Violence:No  Access to Firearms a concern: No  Gang Involvement:No   Subjective: Patient stated, "pretty good" in response to events since last session.  Patient reported her husband's recent doctor's appointment went well. Patient stated, "the only thing I am stressed about is the sleep study". Patient reported she is proud of her husband's progress related to his health. Patient reported she feels her husband should take ownership of increasing his physical activity. Patient reported her husband relies on patient to go for walks. Patient reported progress towards the stitching project they are working on and reported the group plans to donate the final product to [employer]. Patient reported the stitch group she attends is supportive and has offered to provide transportation for patient when her current mode of transportation returns to college. Patient reported feeling "a sense of community" in response to stitch group's  offer to provide transportation to patient. Patient stated, "I haven't had this kind of in person community" in response to previous social interactions. Patient reported feeling the human interaction from the stitch group is beneficial to patient's mental health and positively impacts her mood. Patient stated, "I pulled out a whole eye lid". Patient reported her eye lid was itching and she pulled her eye lashes in response. Patient reported the stress related to husband's medical probation was a trigger. Patient reported she utilized her coping strategies but continued to pull her eye lashes due to the itching sensation. Patient stated, "I just don't like things being out of control". Patient reported use of positive self talk and challenged cognitive distortions since last session.    Interventions: Cognitive Behavioral Therapy. Clinician conducted session via WebEx video from clinician's home office. Patient provided verbal consent to proceed with telehealth session and participated in session from patient's home. Reviewed events since last session. Discussed recent stressors, status of stressors, and progress as it relates to stressors. Challenged cognitive distortions associated with fear related to the results of husband's sleep study. Explored and identified trigger for patient recently pulling her eye lashes out. Reviewed coping strategies patient has been utilizing in response to recent stress/anxiety and the outcome. Validated patient's progress and implementation of coping strategies. Discussed patient's participation in stitch group and identified the benefit to patient's mental health, including patient's mood.     Diagnosis:  Obsessive-compulsive disorder, Trichotillomania   Plan: Patient is to use CBT and coping skills to help manage decrease in symptoms associated with their diagnosis.   Frequency: Weekly  Modality: individual    Long-term goal:   decrease obsessive-compulsive behaviors  to once per week as evidence by decrease in checking sink, mailbox, water heater, AC unit to once per week for at least 3 consecutive months    Target Date: 03/22/23  Progress: progressing    decrease feelings of anger in response to decreased mobility and other medical conditions as evidenced by decrease in feelings of anger and irritability from 3-4 times per month to 1-2 times per month for 3 consecutive months   Target Date: 03/22/23  Progress: progressing    Short-term goal:  identify triggers and automatic thoughts associated with compulsive behaviors    Target Date: 09/20/22  Progress: progressing    identify coping strategies to utilize in response to decreased mobility and other medical conditions   Target Date: 09/20/22  Progress: progressing     Katherina Right, LCSW

## 2022-10-16 ENCOUNTER — Ambulatory Visit (INDEPENDENT_AMBULATORY_CARE_PROVIDER_SITE_OTHER): Payer: BC Managed Care – PPO | Admitting: Clinical

## 2022-10-16 DIAGNOSIS — F422 Mixed obsessional thoughts and acts: Secondary | ICD-10-CM | POA: Diagnosis not present

## 2022-10-16 DIAGNOSIS — F633 Trichotillomania: Secondary | ICD-10-CM

## 2022-10-16 NOTE — Progress Notes (Signed)
[facility] Counselor/Therapist Progress Note  Patient ID: Shannon Brooks, MRN: [id number],    Date: 10/16/2022  Time Spent: 9:32am - 10:24am : 52 minutes   Treatment Type: Individual Therapy  Reported Symptoms: Patient reported recently pulling hair from her eye lashes in response to stressors  Mental Status Exam: Appearance:  Neat     Behavior: Appropriate  Motor: Normal  Speech/Language:  Clear and Coherent  Affect: Appropriate  Mood: normal  Thought process: tangential  Thought content:   Tangential  Sensory/Perceptual disturbances:   WNL  Orientation: oriented to person, place, and situation  Attention: Good  Concentration: Good  Memory: WNL  Fund of knowledge:  Good  Insight:   Good  Judgment:  Good  Impulse Control: Fair   Risk Assessment: Danger to Self:  No Patient denied current suicidal ideation  Self-injurious Behavior: No Danger to Others: No Patient denied current homicidal ideation  Duty to Warn:no Physical Aggression / Violence:No  Access to Firearms a concern: No  Gang Involvement:No   Subjective: Patient reported her husband is no longer on medical probation and has been re-certified to continue his job for another year. Patient reported her husband indicated he was going for his medical evaluation but did not update patient. Patient reported waiting for updates on the status of her husband's evaluation was a trigger for pulling her eye lashes. Patient reported feeling she couldn't control the evaluation but reported pulling her eye lashes was a behavior she could control. Patient reported her sink started leaking and required repair. Patient reported she is now checking the sink multiple times per day for leaks. Patient reported she pulled her eye lashes in response to stress related to sink repairs. Patient reported the severity of the stressor was a barrier to utilizing coping strategies. Patient reported she watched a movie to cope with  recent stressors. Patient stated, "it basically turned my brain off" in response to watching the movie. Patient stated, "pretty good" in response to current mood.   Interventions: Cognitive Behavioral Therapy. Clinician conducted session via WebEx video from clinician's home office. Patient provided verbal consent to proceed with telehealth session and participated in session from patient's home. Reviewed events since last session. Discussed new stressors, status of previous stressors, and progress as it relates to stressors. Explored and identified recent triggers for patient pulling her hair and thoughts associated with triggers. Explored barriers to patient utilizing coping strategies in response to recent stressors. Assessed mood.    Diagnosis:  Obsessive-compulsive disorder Trichotillomania   Plan: Patient is to use CBT and coping skills to help manage decrease in symptoms associated with their diagnosis.   Frequency: Weekly  Modality: individual    Long-term goal:   decrease obsessive-compulsive behaviors to once per week as evidence by decrease in checking sink, mailbox, water heater, AC unit to once per week for at least 3 consecutive months    Target Date: 03/22/23  Progress: progressing    decrease feelings of anger in response to decreased mobility and other medical conditions as evidenced by decrease in feelings of anger and irritability from 3-4 times per month to 1-2 times per month for 3 consecutive months   Target Date: 03/22/23  Progress: progressing    Short-term goal:  identify triggers and automatic thoughts associated with compulsive behaviors    Target Date: 09/20/22  Progress: progressing    identify coping strategies to utilize in response to decreased mobility and other medical conditions   Target Date: 09/20/22  Progress: progressing  Katherina Right, LCSW

## 2022-10-16 NOTE — Progress Notes (Signed)
                Yzabella Crunk, LCSW 

## 2022-10-25 DIAGNOSIS — Z139 Encounter for screening, unspecified: Secondary | ICD-10-CM | POA: Diagnosis not present

## 2022-10-25 DIAGNOSIS — E782 Mixed hyperlipidemia: Secondary | ICD-10-CM | POA: Diagnosis not present

## 2022-10-25 DIAGNOSIS — D509 Iron deficiency anemia, unspecified: Secondary | ICD-10-CM | POA: Diagnosis not present

## 2022-10-25 DIAGNOSIS — E559 Vitamin D deficiency, unspecified: Secondary | ICD-10-CM | POA: Diagnosis not present

## 2022-10-30 ENCOUNTER — Ambulatory Visit (INDEPENDENT_AMBULATORY_CARE_PROVIDER_SITE_OTHER): Payer: BC Managed Care – PPO | Admitting: Clinical

## 2022-10-30 DIAGNOSIS — F422 Mixed obsessional thoughts and acts: Secondary | ICD-10-CM | POA: Diagnosis not present

## 2022-10-30 DIAGNOSIS — F633 Trichotillomania: Secondary | ICD-10-CM | POA: Diagnosis not present

## 2022-10-30 NOTE — Progress Notes (Signed)
                Ireoluwa Grant, LCSW 

## 2022-10-30 NOTE — Progress Notes (Signed)
[facility] Counselor/Therapist Progress Note  Patient ID: Shannon Brooks, MRN: [id number],    Date: 10/30/2022  Time Spent: 8:31am - 9:17am  : 46 minutes  Treatment Type: Individual Therapy  Reported Symptoms: Patient reported a decrease in obsessive thoughts, compulsions, and pulling her hair since last session.   Mental Status Exam: Appearance:  Well Groomed     Behavior: Appropriate  Motor: Normal  Speech/Language:  Clear and Coherent  Affect: Appropriate  Mood: normal  Thought process: normal  Thought content:   WNL  Sensory/Perceptual disturbances:   WNL  Orientation: oriented to person, place, and situation  Attention: Good  Concentration: Good  Memory: WNL  Fund of knowledge:  Good  Insight:   Good  Judgment:  Good  Impulse Control: Fair   Risk Assessment: Danger to Self:  No Patient denied current suicidal ideation  Self-injurious Behavior: No Danger to Others: No Patient denied current homicidal ideation  Duty to Warn:no Physical Aggression / Violence:No  Access to Firearms a concern: No  Gang Involvement:No   Subjective: Patient reported concern that her husband will not continue the recent dietary changes and exercise he has implemented since he is no longer on medical probation.  Patient reported "significantly less" worry in response to her husband no longer being on medical probation. Patient reported she has not been opening both faucets when she checks the sink and is only checking the sink after using the sink. Patient reported previously she was checking the sink multiple times after using the sink. Patient reported she has a bucket beneath the sink which decreases obsessive thoughts and compulsions. Patient reported she has not pulled her hair since last session. Patient stated, "pretty good" in response to current mood.   Interventions: Cognitive Behavioral Therapy. Clinician conducted session via WebEx video from clinician's home office.  Patient provided verbal consent to proceed with telehealth session and participated in session from patient's home. Reviewed events since last session. Discussed the status of previous stressors, progress as it relates to those stressors, and the impact on patient's mood, obsessive thoughts, and compulsions. Assessed the frequency and intensity of obsessive thoughts/compulsions and patient pulling her hair. Assessed mood.    Diagnosis:  Obsessive-compulsive disorder Trichotillomania   Plan: Patient is to use CBT and coping skills to help manage decrease in symptoms associated with their diagnosis.   Frequency: Weekly  Modality: individual    Long-term goal:   decrease obsessive-compulsive behaviors to once per week as evidence by decrease in checking sink, mailbox, water heater, AC unit to once per week for at least 3 consecutive months    Target Date: 03/22/23  Progress: progressing    decrease feelings of anger in response to decreased mobility and other medical conditions as evidenced by decrease in feelings of anger and irritability from 3-4 times per month to 1-2 times per month for 3 consecutive months   Target Date: 03/22/23  Progress: progressing    Short-term goal:  identify triggers and automatic thoughts associated with compulsive behaviors    Target Date: 05/01/23  Progress: progressing    identify coping strategies to utilize in response to decreased mobility and other medical conditions   Target Date: 05/01/23  Progress: progressing     Doree Barthel, LCSW

## 2022-11-01 DIAGNOSIS — D473 Essential (hemorrhagic) thrombocythemia: Secondary | ICD-10-CM | POA: Diagnosis not present

## 2022-11-01 DIAGNOSIS — G43109 Migraine with aura, not intractable, without status migrainosus: Secondary | ICD-10-CM | POA: Diagnosis not present

## 2022-11-01 DIAGNOSIS — D509 Iron deficiency anemia, unspecified: Secondary | ICD-10-CM | POA: Diagnosis not present

## 2022-11-01 DIAGNOSIS — E782 Mixed hyperlipidemia: Secondary | ICD-10-CM | POA: Diagnosis not present

## 2022-11-01 DIAGNOSIS — E559 Vitamin D deficiency, unspecified: Secondary | ICD-10-CM | POA: Diagnosis not present

## 2022-11-01 DIAGNOSIS — G43909 Migraine, unspecified, not intractable, without status migrainosus: Secondary | ICD-10-CM | POA: Diagnosis not present

## 2022-11-17 ENCOUNTER — Ambulatory Visit (INDEPENDENT_AMBULATORY_CARE_PROVIDER_SITE_OTHER): Payer: Medicare Other | Admitting: Clinical

## 2022-11-17 DIAGNOSIS — F633 Trichotillomania: Secondary | ICD-10-CM | POA: Diagnosis not present

## 2022-11-17 DIAGNOSIS — F422 Mixed obsessional thoughts and acts: Secondary | ICD-10-CM

## 2022-11-17 NOTE — Progress Notes (Signed)
[facility] Counselor/Therapist Progress Note  Patient ID: Shannon Brooks, MRN: [id number],    Date: 11/17/2022  Time Spent: 2:30pm- 3:18pm : 48 minutes   Treatment Type: Individual Therapy  Reported Symptoms: Patient reported experiencing anxiety and compulsions associated with change in car's tire pressure.  Mental Status Exam: Appearance:  Well Groomed     Behavior: Appropriate  Motor: Normal  Speech/Language:  Clear and Coherent  Affect: Appropriate  Mood: normal  Thought process: normal  Thought content:   WNL  Sensory/Perceptual disturbances:   WNL  Orientation: oriented to person, place, time/date, and situation  Attention: Good  Concentration: Good  Memory: WNL  Fund of knowledge:  Good  Insight:   Good  Judgment:  Good  Impulse Control: Fair   Risk Assessment: Danger to Self:  No Patient denied current suicidal ideation  Self-injurious Behavior: No Danger to Others: No Patient denied current homicidal ideation  Duty to Warn:no Physical Aggression / Violence:No  Access to Firearms a concern: No  Gang Involvement:No   Subjective: Patient stated, "its been relatively mild" in response to stressors since last session. Patient stated, "I kind of collapsed" since last session and reported "collapse" was due to physical exertion from chronic illness. Patient stated, "my mood's been pretty good" since last session and reported mood has been stable. Patient stated, "its starting to back off" in regards to obsessive thoughts and compulsions related to the sink. Patient reported she had a decrease in her tire pressure this morning and had to have the tire replaced. Patient reported she is experiencing a new compulsions to check the tire pressure since experiencing a decrease in tire pressure. Patient stated, "if I'm proven right I have to get up every time" in response thoughts related to the sink and tire pressure. Patient stated, "I never know when my body is  going to be up to fixing it" in response to thoughts/compulsions associated with the sink. Patient reported being dependent on others and patient's physical limitations are triggers for anxiety.   Interventions: Cognitive Behavioral Therapy Clinician conducted session via caregility video from clinician's home office. Patient provided verbal consent to proceed with telehealth session and participated in session from patient's home. Reviewed events and status of stressors since last session. Assessed patient's mood and symptoms since last session. Explored and identified triggers for obsessive thoughts and compulsions. Provided psycho education related cycle of anxiety, cognitive distortions, such as, over generalization, and challenging distortions. Clinician requested for homework patient complete thought record and identify evidence for and/or against thoughts documented.   Collaboration of Care: Other not required at this time  Consent: Patient/Guardian gives verbal consent for treatment and assignment of benefits for services provided during this visit. Patient/Guardian expressed understanding and agreed to proceed.    Diagnosis:  Obsessive-compulsive disorder Trichotillomania   Plan: Patient is to use CBT and coping skills to help manage decrease in symptoms associated with their diagnosis.   Frequency: Weekly  Modality: individual    Long-term goal:   decrease obsessive-compulsive behaviors to once per week as evidence by decrease in checking sink, mailbox, water heater, AC unit to once per week for at least 3 consecutive months    Target Date: 03/22/23  Progress: progressing    decrease feelings of anger in response to decreased mobility and other medical conditions as evidenced by decrease in feelings of anger and irritability from 3-4 times per month to 1-2 times per month for 3 consecutive months   Target Date: 03/22/23  Progress:  progressing    Short-term goal:  identify triggers  and automatic thoughts associated with compulsive behaviors    Target Date: 05/01/23  Progress: progressing    identify coping strategies to utilize in response to decreased mobility and other medical conditions   Target Date: 05/01/23  Progress: progressing     Doree Barthel, LCSW

## 2022-11-17 NOTE — Progress Notes (Signed)
                Shaterrica Territo, LCSW 

## 2022-11-21 ENCOUNTER — Other Ambulatory Visit: Payer: Self-pay | Admitting: Obstetrics & Gynecology

## 2022-11-27 ENCOUNTER — Ambulatory Visit (INDEPENDENT_AMBULATORY_CARE_PROVIDER_SITE_OTHER): Payer: BC Managed Care – PPO | Admitting: Clinical

## 2022-11-27 DIAGNOSIS — F633 Trichotillomania: Secondary | ICD-10-CM

## 2022-11-27 DIAGNOSIS — F422 Mixed obsessional thoughts and acts: Secondary | ICD-10-CM

## 2022-11-27 NOTE — Progress Notes (Signed)
                Alann Avey, LCSW 

## 2022-11-27 NOTE — Progress Notes (Signed)
[facility] Counselor/Therapist Progress Note  Patient ID: Shannon Brooks, MRN: [id number],    Date: 11/27/2022  Time Spent: 8:32am - 9:24am : 52 minutes  Treatment Type: Individual Therapy  Reported Symptoms: Patient reported she has experienced visual hallucinations during migraines  Mental Status Exam: Appearance:  Well Groomed     Behavior: Appropriate  Motor: Normal  Speech/Language:  Clear and Coherent  Affect: Appropriate  Mood: normal  Thought process: normal  Thought content:   WNL  Sensory/Perceptual disturbances:   WNL  Orientation: oriented to person, place, and situation  Attention: Good  Concentration: Good  Memory: WNL  Fund of knowledge:  Good  Insight:   Good  Judgment:  Good  Impulse Control: Fair   Risk Assessment: Danger to Self:  No Patient denied current suicidal ideation. Patient denied current hallucinations.  Self-injurious Behavior: No Danger to Others: No Patient denied current homicidal ideation   Duty to Warn:no Physical Aggression / Violence:No  Access to Firearms a concern: No  Gang Involvement:No   Subjective: Patient reported she has injured her foot since last session. Patient reported feeling her reaction time has decreased due to medication and impacts patient's ability to play video games which patient reported can be a stressor.  Patient reported video games are an escape for patient. Patient reported she played a new video game for a week but has now returned to her regular routine. Patient reported at times playing video games trigger migraines and reported when experiencing migraines patient experiences visual hallucinations of a dragon. Patient reported she is aware the dragon is a hallucination and has discussed the symptoms with her neurologist. Patient reported she also experiences auras with migraines. Patient reported she goes to sleep to cope with migraines and takes breaks when playing video games. Patient  reported she experienced a migraine last week after playing a video game. Patient reported she had a flat tire after experiencing issues with decreased tire pressure and had the tire replaced. Patient reported she did not document many negative thoughts for homework due to patient not experiencing many negative thoughts since last session. Patient reported she has been monitoring her tire pressure three times per week. Patient reported feeling others were dismissing patient's concerns regarding her tire pressure and reported feelings of resentment in response.   Interventions: Cognitive Behavioral Therapy. Clinician conducted session via caregility video from clinician's home office. Patient provided verbal consent to proceed with telehealth session and participated in session from patient's home. Reviewed events since last session. Discussed recent stressors, symptoms associated with migraines and identified coping strategies patient utilizes in response to migraines. Reviewed patient's thought record for homework. Assessed intensity and frequency of obsessive thoughts and compulsions associated with tire pressure. Discussed triggers for thoughts associated with tire pressure and patient checking her car's tire pressure. Provided psycho education related to obsessive thoughts and compulsions. Discussed consents for patient's primary care provider and neurologists. Clinician requested for homework patient continue thought record and identify evidence for and/or against thoughts documented.   Collaboration of Care: Primary Care Provider AEB patient requested to sign a consent for her primary care provider, Dr. Dwana Melena in [place], [place] and Other for patient's neurologists, Dr. Terrace Arabia and Margie Ege, PA at [facility]  Patient/Guardian was advised Release of Information must be obtained prior to any record release in order to collaborate their care with an outside provider. Patient/Guardian was advised if  they have not already done so to contact [facility] to sign all  necessary forms in order for [place] to release information regarding their care.   Consent: Patient/Guardian gives verbal consent for treatment and assignment of benefits for services provided during this visit. Patient/Guardian expressed understanding and agreed to proceed.     Diagnosis:  Obsessive-compulsive disorder Trichotillomania   Plan: Patient is to use CBT and coping skills to help manage decrease in symptoms associated with their diagnosis.   Frequency: Weekly  Modality: individual    Long-term goal:   decrease obsessive-compulsive behaviors to once per week as evidence by decrease in checking sink, mailbox, water heater, AC unit to once per week for at least 3 consecutive months    Target Date: 03/22/23  Progress: progressing    decrease feelings of anger in response to decreased mobility and other medical conditions as evidenced by decrease in feelings of anger and irritability from 3-4 times per month to 1-2 times per month for 3 consecutive months   Target Date: 03/22/23  Progress: progressing    Short-term goal:  identify triggers and automatic thoughts associated with compulsive behaviors    Target Date: 05/01/23  Progress: progressing    identify coping strategies to utilize in response to decreased mobility and other medical conditions   Target Date: 05/01/23  Progress: progressing    Doree Barthel, LCSW

## 2022-12-05 ENCOUNTER — Other Ambulatory Visit: Payer: Self-pay | Admitting: Neurology

## 2022-12-11 ENCOUNTER — Ambulatory Visit (INDEPENDENT_AMBULATORY_CARE_PROVIDER_SITE_OTHER): Payer: BC Managed Care – PPO | Admitting: Clinical

## 2022-12-11 DIAGNOSIS — F422 Mixed obsessional thoughts and acts: Secondary | ICD-10-CM | POA: Diagnosis not present

## 2022-12-11 DIAGNOSIS — F633 Trichotillomania: Secondary | ICD-10-CM

## 2022-12-11 NOTE — Progress Notes (Signed)
                Gustie Bobb, LCSW 
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
## 2022-12-26 NOTE — Progress Notes (Signed)
This encounter was created in error - please disregard.

## 2022-12-29 ENCOUNTER — Ambulatory Visit (INDEPENDENT_AMBULATORY_CARE_PROVIDER_SITE_OTHER): Payer: BC Managed Care – PPO | Admitting: Clinical

## 2022-12-29 DIAGNOSIS — F633 Trichotillomania: Secondary | ICD-10-CM | POA: Diagnosis not present

## 2022-12-29 DIAGNOSIS — F422 Mixed obsessional thoughts and acts: Secondary | ICD-10-CM | POA: Diagnosis not present
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
## 2022-12-29 NOTE — Progress Notes (Signed)
                Florena Kozma, LCSW 

## 2023-01-03 ENCOUNTER — Ambulatory Visit: Payer: Medicare Other | Admitting: Neurology

## 2023-01-03 ENCOUNTER — Other Ambulatory Visit: Payer: Self-pay | Admitting: Neurology

## 2023-01-08 ENCOUNTER — Ambulatory Visit (INDEPENDENT_AMBULATORY_CARE_PROVIDER_SITE_OTHER): Payer: BC Managed Care – PPO | Admitting: Clinical

## 2023-01-08 DIAGNOSIS — F633 Trichotillomania: Secondary | ICD-10-CM

## 2023-01-08 DIAGNOSIS — F422 Mixed obsessional thoughts and acts: Secondary | ICD-10-CM | POA: Diagnosis not present

## 2023-01-08 NOTE — Progress Notes (Signed)
[facility] Counselor/Therapist Progress Note  Patient ID: Shannon Brooks, MRN: [id number],    Date: 01/08/2023  Time Spent: 8:34 am - 9:27 am : 53 minutes  Treatment Type: Individual Therapy  Reported Symptoms: Patient reported fatigue  Mental Status Exam: Appearance:  Well Groomed     Behavior: Appropriate  Motor: Normal  Speech/Language:  Clear and Coherent  Affect: Appropriate  Mood: normal  Thought process: tangential  Thought content:   Tangential  Sensory/Perceptual disturbances:   WNL  Orientation: oriented to person, place, and situation  Attention: Good  Concentration: Good  Memory: WNL  Fund of knowledge:  Good  Insight:   Good  Judgment:  Good  Impulse Control: Fair   Risk Assessment: Danger to Self:  No Patient denied current suicidal ideation  Self-injurious Behavior: No Danger to Others: No Patient denied current homicidal ideation Duty to Warn:no Physical Aggression / Violence:No  Access to Firearms a concern: No  Gang Involvement:No   Subjective: Patient reported anxiety related to a recent purchase. Patient reported feeling fatigued.  Patient stated, "I have to mask" when patient is around her friend. Patient reported she has to maintain her boundaries and repeat her responses when patient's friend continues to question patient's boundaries. Patient reported her mother had an allergic reaction while recovering from surgery and had to go to the emergency room. Patient stated, "its going about like I expected I wind up there for the majority of the day" in response to caring for her mother.  Patient reported she purchased a steam deck for gaming that has allowed patient to utilize a form of self care while she is caring for her mother. Patient reported she has been coming home to take naps during the day. Patient stated, "I've come home far less mentally drained" since using the steam deck. Patient stated, "definitely better" in response to mood  since last session. Patient stated,  "not as much as I should but I haven't neglected it entirely" in response to patient's thought record. Patient reported she has not been taking her thought record to her parents' house while she is caring for her mother. Patient reported entries in her thought record are related to patient's house and patient reported she has not been exposed to triggers as often due to increased time spent at her parents house. Patient stated, "my biggest anxiety has been we spent money I don't have".   Interventions: Cognitive Behavioral Therapy and Interpersonal. Clinician conducted session via caregility video from clinician's home office. Patient provided verbal consent to proceed with telehealth session and is aware of limitations of telephone or video visits. Patient participated in session from patient's home. Reviewed status of recent stressors. Discussed challenges patient experiences in patient's relationship with her friend. Explored new self care strategies patient has implemented since last session and the outcome. Explored the impact of caregiving on patient's physical and mental health. Reviewed patient's thought record for homework and barriers to completing thought record. Discussed recent triggers for anxiety. Clinician requested for homework patient continue thought record and identify evidence for and/or against thoughts documented.    Collaboration of Care: Other not required at this time   Diagnosis:  Obsessive-compulsive disorder Trichotillomania   Plan: Patient is to use CBT and coping skills to help manage decrease in symptoms associated with their diagnosis.   Frequency: Weekly  Modality: individual    Long-term goal:   decrease obsessive-compulsive behaviors to once per week as evidence by decrease in checking sink, mailbox, water  heater, AC unit to once per week for at least 3 consecutive months    Target Date: 03/22/23  Progress: progressing     decrease feelings of anger in response to decreased mobility and other medical conditions as evidenced by decrease in feelings of anger and irritability from 3-4 times per month to 1-2 times per month for 3 consecutive months   Target Date: 03/22/23  Progress: progressing    Short-term goal:  identify triggers and automatic thoughts associated with compulsive behaviors    Target Date: 05/01/23  Progress: progressing    identify coping strategies to utilize in response to decreased mobility and other medical conditions   Target Date: 05/01/23  Progress: progressing    Doree Barthel, LCSW

## 2023-01-08 NOTE — Progress Notes (Signed)
                Kanasia Gayman, LCSW 

## 2023-01-22 ENCOUNTER — Ambulatory Visit: Payer: BC Managed Care – PPO | Admitting: Clinical

## 2023-01-22 DIAGNOSIS — F633 Trichotillomania: Secondary | ICD-10-CM

## 2023-01-22 DIAGNOSIS — F422 Mixed obsessional thoughts and acts: Secondary | ICD-10-CM

## 2023-01-22 NOTE — Progress Notes (Signed)
[facility] Counselor/Therapist Progress Note  Patient ID: Shannon Brooks, MRN: [id number],    Date: 01/22/2023  Time Spent: 9:33am - 10:22am : 49 minutes   Treatment Type: Individual Therapy  Reported Symptoms: Patient reported fatigue  Mental Status Exam: Appearance:  Neat and Well Groomed     Behavior: Appropriate  Motor: Normal  Speech/Language:  Clear and Coherent  Affect: Appropriate  Mood: normal  Thought process: tangential  Thought content:   Tangential  Sensory/Perceptual disturbances:   WNL  Orientation: oriented to person, place, and situation  Attention: Good  Concentration: Good  Memory: WNL  Fund of knowledge:  Good  Insight:   Good  Judgment:  Good  Impulse Control: Fair   Risk Assessment: Danger to Self:  No Patient denied current suicidal ideation  Self-injurious Behavior: No Danger to Others: No Patient denied current homicidal ideation Duty to Warn:no Physical Aggression / Violence:No  Access to Firearms a concern: No  Gang Involvement:No   Subjective: Patient reported she recently received a notification from the state of [place] that patient is scheduled for a disability review. Patient reported she called to obtain clarification regarding several questions on the forms patient received related to the review. Patient reported notification of patient's disability review and several questions on the form have been triggers for feelings of anxiety. Patient stated, "I'm just going to have the circular thoughts" in regards to patient's disability review. Patient reported experiencing anxiety when opening mail from the state of . Patient reported a fear of losing her insurance and reported she feels her disability income allows patient to contribute financially to their household. Patient reported her mother is recovering well from surgery and stated, "dad and I are starting to strike a bit of a balance" in response to patient's time caring for her  mother. Patient reported she and her father are alternating days to provide care for her mother. Patient reported she has not been able to crochet due to pain and swelling from carpal tunnel syndrome. Patient reported the length of time to receive her husband's bonus was a trigger for anxiety. Patient reported recent issues with her tire were a trigger for anxiety and reported she is concerned that the valve stem is loose.  Patient reported she uses the challenging questions to challenge thoughts associated with feelings of anxiety after checking the issue of concern. Patient reported feeling "tired but ok" as it relates to patient's current mood.   Interventions: Cognitive Behavioral Therapy. Clinician conducted session via caregility video from clinician's home office. Patient provided verbal consent to proceed with telehealth session and is aware of limitations of telephone or video visits. Patient participated in session from patient's home. Patient reported her friend may enter the home during patient's session. Patient provided verbal consent for her friend to be present during today's session. Explored and identified recent triggers for symptoms of anxiety and thoughts associated with feelings of anxiety. Reviewed patient's thought record. Reviewed status of patient's caregiving role. Reviewed patient's implementation of challenging thought distortions. Provided psycho education related to challenging thought distortions. Assessed patient's mood. Clinician requested for homework patient continue thought record and identify evidence for and/or against thoughts documented.    Collaboration of Care: Other not required at this time   Diagnosis:  Obsessive-compulsive disorder Trichotillomania   Plan: Patient is to use CBT and coping skills to help manage decrease in symptoms associated with their diagnosis.   Frequency: Weekly  Modality: individual    Long-term goal:   decrease  obsessive-compulsive  behaviors to once per week as evidence by decrease in checking sink, mailbox, water heater, AC unit to once per week for at least 3 consecutive months    Target Date: 03/22/23  Progress: progressing    decrease feelings of anger in response to decreased mobility and other medical conditions as evidenced by decrease in feelings of anger and irritability from 3-4 times per month to 1-2 times per month for 3 consecutive months   Target Date: 03/22/23  Progress: progressing    Short-term goal:  identify triggers and automatic thoughts associated with compulsive behaviors    Target Date: 05/01/23  Progress: progressing    identify coping strategies to utilize in response to decreased mobility and other medical conditions   Target Date: 05/01/23  Progress: progressing    Doree Barthel, LCSW

## 2023-01-22 NOTE — Progress Notes (Signed)
                Kemarion Abbey, LCSW 

## 2023-01-23 NOTE — Progress Notes (Signed)
PATIENT: Shannon Brooks DOB: [birth year]  REASON FOR VISIT: follow up for chronic migraine headache HISTORY FROM: patient PRIMARY NEUROLOGIST: Dr. Terrace Arabia   HISTORY Shannon Brooks is a 37 years old female, seen in refer by his primary care doctor Benita Stabile for evaluation of dizziness, initial evaluation was on April 10 2017.   I reviewed and summarized the referring note, she had a past medical history of chronic migraine, obese, presented with frequent vertigo with associated headache since 2017    She had migraine headaches since 37 years old, her typical migraine are lateralized severe pounding headache with associated light noise sensitivity, nauseous, lasting for a few hours, was helped by caffeine, over-the-counter Excedrin Migraine, Tylenol ibuprofen as needed, sometimes her migraines are preceded by auras, visual distortion, dizziness,   She also complains of intermittent vertigo episodes since 2013, gradually getting worse especially since 2017, over the past few months, she has it almost on a weekly basis, sudden onset of unsteady gait, difficulty focusing, brain foggy sensation, lasting for few hours, caffeine usually helps her symptoms temporarily, oftentimes is followed by severe migraine headaches,   Stopping birth control in March 2018 seems to make her symptoms worse    Update July 10, 2017: She is now taking Topamax 100 mg twice a day, doing very well, lost 11 pounds over past 3 months, Imitrex as needed works well for her migraine headaches, and recurrent dizzy spells, continue have recurrent spells of lightheadedness, whole body tremor, without loss consciousness lasting for a few minutes, not necessarily associated with headaches,   UPDATE September 30 2018: She is over all happy with current migraine control, she is taking Topamax 200mg  daily, magnesium oxide, riboflavin 100mg  twice a day as preventive medications.     She has migraine once a week,  Sometimes  precede by dizziness, sleeping would help.     Imitrex 50mg  as needed was helpful.   She also came with a list of constellation of complaints, frequent body tremor, disturbance spells, staring at the computer screen,Watching TV, increased brain fog   EEG was normal in Dec 2018   Update Dec 17, 2019 SS: Overall happy with current migraine control, taking Topamax 100 mg twice a day, magnesium oxide, riboflavin for preventative medications.  Has headache with pain about once a month, will take Imitrex with good benefit.  Has daily dizziness/vertigo vision, "wake up on the boat", it may worsen throughout the day.  She carries a cane just in case.  She is now on disability.  She may have occasional sensation of cold or heat to crown of head during spells.  Daily dizzy spells have been chronic.  Here with husband, not planning to start a family currently.  Has close follow-up with PCP, found to have low vitamin D.  Topamax makes symptoms manageable.  Update Dec 21, 2020 SS: Sees hematology at [facility] for thrombocytosis, vitamin D level has been low, on prescription strength, did trial off Vitamin D, noticed significant worsening in memory, gained 13 lbs, was sleeping a lot. Is back on D2, feeling great now. Rare migraines, noted significant worsening in vertigo when off the D2. Only 4 Imitrex a year, rapid onset, notes worst with menstrual cycle and barometric pressure change. Has dizziness from minute she gets up, topamax "mutes it". Some visual hallucinations rare, could be migraine or side effect from Topamax? Doesn't drive further than 5 minutes from home. Also on magnesium/riboflavin. Has cane if needed.  Update Dec 14, 2021 SS:  Remains on Topamax 100 mg twice daily, typically once a month gets migraine with pain, barometric pressure/hormones are triggers. Had fallopian tubes removed in October, still on progesterone birth control for hormones. Without Topamax, is very dizzy, immobile. Takes Imitrex at onset  of headache with good benefit. In past on Paxil. Still has dizziness daily that is part of her headache process, really impacts her life, ability to be independent.   Update June 20, 2022 SS: Had side effect with Effexor, remains on Topamax 100 mg twice daily. Is in a good period of time, this a a 4 week good period before rainy weather season. Takes Imitrex once a month for headache, it helps. Has daily dizzy aura.   Update January 24, 2023 SS: Here with husband, disability is up for review, on it since 2020. Has about 4 good hours in a day, other hours feels like she is on a boat. Has been felt to be migraine with feeling of vertigo. Did Bennie Pierini since Nov, reports increased her cystic acne all over, heat rashes and couldn't sweat. No change with Qulipta. On Topamax 100 mg twice daily. About every month will have traditional migraine with pain around period. With daily vertigo, all migraine features, extremely photosensitive, spends a lot of time in bed. Has seen ENT, determined no ENT etiology of symptoms. Doesn't want to make any more changes, stress going helping her mother recovering from knee surgery. Reports frequent falls.   REVIEW OF SYSTEMS: Out of a complete 14 system review of symptoms, the patient complains only of the following symptoms, and all other reviewed systems are negative.  See HPI  ALLERGIES: Allergies  Allergen Reactions   Penicillins     Was told she was allergic but has taken, amoxicillin with no reaction   Desipramine Rash    HOME MEDICATIONS: Outpatient Medications Prior to Visit  Medication Sig Dispense Refill   acetaminophen (TYLENOL) 325 MG tablet Take 650 mg by mouth every 6 (six) hours as needed for moderate pain.     albuterol (PROVENTIL HFA;VENTOLIN HFA) 108 (90 BASE) MCG/ACT inhaler Inhale 2 puffs into the lungs every 6 (six) hours as needed for wheezing or shortness of breath.      atorvastatin (LIPITOR) 20 MG tablet Take 20 mg by mouth daily.      cetirizine (ZYRTEC) 10 MG tablet Chew 10 mg by mouth daily.     chlorpheniramine (CHLOR-TRIMETON) 4 MG tablet Take 4 mg by mouth in the morning and at bedtime.     Cholecalciferol (VITAMIN D) 50 MCG (2000 UT) tablet Take 2,000 Units by mouth daily.     ergocalciferol (VITAMIN D2) 1.25 MG (50000 UT) capsule Take 50,000 Units by mouth once a week.     ferrous sulfate 325 (65 FE) MG tablet Take 325 mg by mouth daily with breakfast.     guaifenesin (HUMIBID E) 400 MG TABS tablet Take 400 mg by mouth in the morning and at bedtime.     hyoscyamine (LEVSIN) 0.125 MG tablet Take 0.125 mg by mouth every 6 (six) hours as needed (diarrhea).     ibuprofen (ADVIL,MOTRIN) 800 MG tablet Take 800 mg by mouth at bedtime.     magnesium oxide (MAG-OX) 400 MG tablet Take 400 mg by mouth 2 (two) times daily.     Menthol, Topical Analgesic, (BIOFREEZE EX) Apply 1 application topically daily as needed (pain).     norethindrone (MICRONOR) 0.35 MG tablet Take 1 tablet by mouth once daily 56 tablet 3  Phenylephrine HCl 5 MG TABS Take 5 mg by mouth in the morning and at bedtime.     Potassium 99 MG TABS Take 99 mg by mouth daily.     Riboflavin (VITAMIN B2 PO) Take 100 mg by mouth 2 (two) times daily.     QULIPTA 60 MG TABS Take 1 tablet by mouth once daily 30 tablet 0   SUMAtriptan (IMITREX) 50 MG tablet Take 1 tablet (50 mg total) by mouth every 2 (two) hours as needed for migraine. May repeat in 2 hours if headache persists or recurs. 12 tablet 11   topiramate (TOPAMAX) 100 MG tablet Take 1 tablet (100 mg total) by mouth 2 (two) times daily. 180 tablet 3   No facility-administered medications prior to visit.    PAST MEDICAL HISTORY: Past Medical History:  Diagnosis Date   Asthma    Broken toe    Carpal tunnel syndrome    Iron deficiency anemia    Migraine    Near syncope Episodic near-syncope   Obesity, unspecified    Obesity   OCD (obsessive compulsive disorder)    Vertigo    Vitamin D deficiency      PAST SURGICAL HISTORY: Past Surgical History:  Procedure Laterality Date   LAPAROSCOPIC BILATERAL SALPINGECTOMY Bilateral 04/27/2021   Procedure: LAPAROSCOPIC BILATERAL SALPINGECTOMY;  Surgeon: Lazaro Arms, MD;  Location: [facility];  Service: Gynecology;  Laterality: Bilateral;   TONSILLECTOMY     WISDOM TOOTH EXTRACTION Bilateral     FAMILY HISTORY: Family History  Problem Relation Age of Onset   Deafness Mother    Diabetes Mother    Diabetes Maternal Grandmother    Brain cancer Maternal Grandmother    Diabetes Maternal Grandfather    Multiple sclerosis Other    Lupus Other     SOCIAL HISTORY: Social History   Socioeconomic History   Marital status: Married    Spouse name: Not on file   Number of children: 0   Years of education: some college   Highest education level: Not on file  Occupational History   Occupation: Unemployed  Tobacco Use   Smoking status: Never   Smokeless tobacco: Never  Vaping Use   Vaping Use: Never used  Substance and Sexual Activity   Alcohol use: No   Drug use: No   Sexual activity: Yes    Birth control/protection: Pill, Surgical    Comment: tubal  Other Topics Concern   Not on file  Social History Narrative   Lives at home with spouse.   Right-handed.    Occasional caffeine use.   Social Determinants of Health   Financial Resource Strain: Low Risk  (02/24/2021)   Overall Financial Resource Strain (CARDIA)    Difficulty of Paying Living Expenses: Not hard at all  Food Insecurity: No Food Insecurity (02/24/2021)   Hunger Vital Sign    Worried About Running Out of Food in the Last Year: Never true    Ran Out of Food in the Last Year: Never true  Transportation Needs: No Transportation Needs (02/24/2021)   PRAPARE - Administrator, Civil Service (Medical): No    Lack of Transportation (Non-Medical): No  Physical Activity: Insufficiently Active (02/24/2021)   Exercise Vital Sign    Days of Exercise per Week: 3 days     Minutes of Exercise per Session: 20 min  Stress: No Stress Concern Present (02/24/2021)   [employer] of Occupational Health - Occupational Stress Questionnaire    Feeling of Stress :  Not at all  Social Connections: Moderately Isolated (02/24/2021)   Social Connection and Isolation Panel [NHANES]    Frequency of Communication with Friends and Family: More than three times a week    Frequency of Social Gatherings with Friends and Family: Twice a week    Attends Religious Services: Never    Database administrator or Organizations: No    Attends Banker Meetings: Never    Marital Status: Married  Catering manager Violence: Not At Risk (02/24/2021)   Humiliation, Afraid, Rape, and Kick questionnaire    Fear of Current or Ex-Partner: No    Emotionally Abused: No    Physically Abused: No    Sexually Abused: No  PHYSICAL EXAM  Vitals:   01/24/23 1242  BP: 117/76  Pulse: 97  Weight: (!) 304 lb (137.9 kg)  Height: 5\' 5"  (1.651 m)   Body mass index is 50.59 kg/m.  Generalized: Well developed, in no acute distress , obese  Neurological examination  Mentation: Alert oriented to time, place, history taking. Follows all commands speech and language fluent Cranial nerve II-XII: Pupils were equal round reactive to light. Extraocular movements were full, visual field were full on confrontational test. Facial sensation and strength were normal. Head turning and shoulder shrug were normal and symmetric. Motor: The motor testing reveals 5 over 5 strength of all 4 extremities. Good symmetric motor tone is noted throughout.  Sensory: Sensory testing is intact to soft touch on all 4 extremities. No evidence of extinction is noted.  Coordination: Cerebellar testing reveals good finger-nose-finger and heel-to-shin bilaterally.  Gait and station: Has to push off to stand, when standing has sudden sway, crossing her right leg over the left leaning to left but did not fall, it was  abrupt  DIAGNOSTIC DATA (LABS, IMAGING, TESTING) - I reviewed patient records, labs, notes, testing and imaging myself where available.  Lab Results  Component Value Date   WBC 14.0 (H) 12/28/2021   HGB 13.7 12/28/2021   HCT 42.7 12/28/2021   MCV 87.5 12/28/2021   PLT 488 (H) 12/28/2021      Component Value Date/Time   NA 136 04/25/2021 0907   K 3.7 04/25/2021 0907   CL 106 04/25/2021 0907   CO2 21 (L) 04/25/2021 0907   GLUCOSE 75 04/25/2021 0907   BUN 14 04/25/2021 0907   CREATININE 0.66 04/25/2021 0907   CALCIUM 9.5 04/25/2021 0907   PROT 7.5 04/25/2021 0907   ALBUMIN 4.0 04/25/2021 0907   AST 22 04/25/2021 0907   ALT 23 04/25/2021 0907   ALKPHOS 62 04/25/2021 0907   BILITOT 0.6 04/25/2021 0907   GFRNONAA >60 04/25/2021 0907   GFRAA >60 10/27/2019 1035   No results found for: "CHOL", "HDL", "LDLCALC", "LDLDIRECT", "TRIG", "CHOLHDL" No results found for: "HGBA1C" Lab Results  Component Value Date   VITAMINB12 630 12/28/2021   Lab Results  Component Value Date   TSH 1.410 11/07/2019    ASSESSMENT AND PLAN 37 y.o. year old female  1.  Complicated migraine headache with dizziness 2.  Mood disorder  -Continues with symptoms and episodes, daily dizziness with migraine features, currently does not wish to try any other medications -Continue Topamax 100 mg twice daily -take Imitrex as needed for acute migraine treatment -Tried and Failed: Effexor, wouldn't use BB due to asthma, Qulipta was not helpful, She wants to hold off of CGRP injection due to concern for side effect -Next steps: Amitriptyline, CGRP, Botox; we discussed referral to academic center  she wishes to hold off for now -Reach out via MyChart if needed, follow-up in 8 months or sooner if needed  Meds ordered this encounter  Medications   topiramate (TOPAMAX) 100 MG tablet    Sig: Take 1 tablet (100 mg total) by mouth 2 (two) times daily.    Dispense:  180 tablet    Refill:  3   SUMAtriptan (IMITREX)  50 MG tablet    Sig: Take 1 tablet (50 mg total) by mouth every 2 (two) hours as needed for migraine. May repeat in 2 hours if headache persists or recurs.    Dispense:  12 tablet    Refill:  11   Taelor Waymire lack, AGNP-C, DNP 01/24/2023, 1:20 PM [facility] [address], [address] [place], [place] [address] [phone]

## 2023-01-24 ENCOUNTER — Encounter: Payer: Self-pay | Admitting: Neurology

## 2023-01-24 ENCOUNTER — Ambulatory Visit (INDEPENDENT_AMBULATORY_CARE_PROVIDER_SITE_OTHER): Payer: BC Managed Care – PPO | Admitting: Neurology

## 2023-01-24 VITALS — BP 117/76 | HR 97 | Ht 65.0 in | Wt 304.0 lb

## 2023-01-24 DIAGNOSIS — R42 Dizziness and giddiness: Secondary | ICD-10-CM | POA: Diagnosis not present

## 2023-01-24 DIAGNOSIS — G43109 Migraine with aura, not intractable, without status migrainosus: Secondary | ICD-10-CM | POA: Diagnosis not present

## 2023-01-24 MED ORDER — SUMATRIPTAN SUCCINATE 50 MG PO TABS: 50.0000 mg | ORAL_TABLET | ORAL | 11 refills | Status: AC | PRN

## 2023-01-24 MED ORDER — TOPIRAMATE 100 MG PO TABS: 100.0000 mg | ORAL_TABLET | Freq: Two times a day (BID) | ORAL | 3 refills | Status: DC

## 2023-01-24 NOTE — Patient Instructions (Signed)
Great to see you today! We will continue Topamax If you wish to try something else please let me know, otherwise I will see you in 8 months Thanks!!

## 2023-02-09 ENCOUNTER — Ambulatory Visit (INDEPENDENT_AMBULATORY_CARE_PROVIDER_SITE_OTHER): Payer: BC Managed Care – PPO | Admitting: Clinical

## 2023-02-09 DIAGNOSIS — F633 Trichotillomania: Secondary | ICD-10-CM

## 2023-02-09 DIAGNOSIS — F422 Mixed obsessional thoughts and acts: Secondary | ICD-10-CM

## 2023-02-09 NOTE — Progress Notes (Signed)
[facility] Counselor/Therapist Progress Note  Patient ID: Shannon Brooks, MRN: [id number],    Date: 02/09/2023  Time Spent: 9:33am - 10:20am : 47 minutes   Treatment Type: Individual Therapy  Reported Symptoms: Patient reported obsessive thoughts and compulsions.   Mental Status Exam: Appearance:  Neat and Well Groomed     Behavior: Appropriate  Motor: Normal  Speech/Language:  Clear and Coherent  Affect: Appropriate  Mood: normal  Thought process: tangential  Thought content:   Tangential  Sensory/Perceptual disturbances:   WNL  Orientation: oriented to person and place  Attention: Good  Concentration: Good  Memory: WNL  Fund of knowledge:  Good  Insight:   Good  Judgment:  Good  Impulse Control: Fair   Risk Assessment: Danger to Self:  No Patient denied current suicidal ideation  Self-injurious Behavior: No Danger to Others: No Patient denied current homicidal ideation Duty to Warn:no Physical Aggression / Violence:No  Access to Firearms a concern: No  Gang Involvement:No   Subjective: Patient reported she recently feared her father was going to be a double amputee and was going to die. Patient reported her mother notified patient that her father had a fever. Patient reported her father was taken to the hospital and admitted to the hospital as a result. Patient reported her father was diagnosed with cellulitis and sepsis in his feet. Patient reported fear that her mother was going to miss a call from the hospital indicating her father had passed away or his feet were amputated. Patient reported her father has a weak immune system, is in stage 4 kidney failure and reported she feels her father is neglectful of his health. Patient stated, "I've been a little stressed out". Patient reported she pulled hair out of her eye brows in response to recent stress. Patient reported increased anxiety in response to recent stress. Patient reported an increase in  compulsions related to checking air pressure in her tires and reported she has started checking a glass door in her home. Patient reported compulsions related to checking her tire have decreased since stressors have resolved. Patient reported she was concerned about the air pressure in her tires due to the family relying on patient's car for transportation to and from the hospital.    Interventions: Cognitive Behavioral Therapy. Clinician conducted session via caregility video from clinician's home office. Patient provided verbal consent to proceed with telehealth session and is aware of limitations of telephone or video visits. Patient participated in session from patient's home. Reviewed patient's entries in her thought record. Explored patient's thoughts associated with father's recent hospitalization and patient's feelings in response. Explored and identified patient's thoughts associated with the compulsions to check the air pressure in patient's tires. Validated patient's progress as it relates to increase in self awareness. Provided psycho education related to obsessive thoughts and compulsions. Clinician requested for homework patient continue thought record and identify evidence for and/or against thoughts documented.    Collaboration of Care: Other not required at this time   Diagnosis:  Obsessive-compulsive disorder Trichotillomania   Plan: Patient is to use CBT and coping skills to help manage decrease in symptoms associated with their diagnosis.   Frequency: Weekly  Modality: individual    Long-term goal:   decrease obsessive-compulsive behaviors to once per week as evidence by decrease in checking sink, mailbox, water heater, AC unit to once per week for at least 3 consecutive months    Target Date: 03/22/23  Progress: progressing    decrease feelings of anger  in response to decreased mobility and other medical conditions as evidenced by decrease in feelings of anger and irritability  from 3-4 times per month to 1-2 times per month for 3 consecutive months   Target Date: 03/22/23  Progress: progressing    Short-term goal:  identify triggers and automatic thoughts associated with compulsive behaviors    Target Date: 05/01/23  Progress: progressing    identify coping strategies to utilize in response to decreased mobility and other medical conditions   Target Date: 05/01/23  Progress: progressing     Doree Barthel, LCSW

## 2023-02-09 NOTE — Progress Notes (Signed)
                Karen Sharpe, LCSW 

## 2023-02-26 ENCOUNTER — Ambulatory Visit (INDEPENDENT_AMBULATORY_CARE_PROVIDER_SITE_OTHER): Payer: BC Managed Care – PPO | Admitting: Clinical

## 2023-02-26 DIAGNOSIS — F422 Mixed obsessional thoughts and acts: Secondary | ICD-10-CM | POA: Diagnosis not present

## 2023-02-26 DIAGNOSIS — F633 Trichotillomania: Secondary | ICD-10-CM | POA: Diagnosis not present

## 2023-02-26 NOTE — Progress Notes (Signed)
[facility] Counselor/Therapist Progress Note  Patient ID: Shannon Brooks, MRN: [id number],    Date: 02/26/2023  Time Spent: 10:32 am - 11:23am : 51 minutes   Treatment Type: Individual Therapy  Reported Symptoms: Patient reported a recent increase in anxiety.   Mental Status Exam: Appearance:  Neat and Well Groomed     Behavior: Appropriate  Motor: Normal  Speech/Language:  Clear and Coherent  Affect: Appropriate  Mood: normal  Thought process: tangential  Thought content:   Tangential  Sensory/Perceptual disturbances:   WNL  Orientation: oriented to person, place, and situation  Attention: Good  Concentration: Good  Memory: WNL  Fund of knowledge:  Good  Insight:   Good  Judgment:  Good  Impulse Control: Fair   Risk Assessment: Danger to Self:  No Patient denied current suicidal ideation  Self-injurious Behavior: No Danger to Others: No Patient denied current homicidal ideation Duty to Warn:no Physical Aggression / Violence:No  Access to Firearms a concern: No  Gang Involvement:No   Subjective: Patient reported her husband's computer broke recently and patient reported challenges in obtaining a new computer. Patient reported in the past when she purchased items, patient previously took the tags off of the items in the car on the way home. Patient reported she no longer takes the tags off the items but holds the item while in the vehicle on the way to her destination. Patient reported increased anxiety during this time of year, and reported when shopping for school items as a child patient's father would turn around on the way home and return the items. Patient reported she previously experienced the thought, "if I see it, it's safe" in response to holding items purchased. Patient reported she continues to experience the thought "if I see it, it's safe" but reported the thought does not linger. Patient reported recently she was able to identify the trigger for  increased anxiety this time of year. Patient reported she has resumed checking the tire pressure in her tires due to the need to replace a valve stem. Patient reported she has been checking her air conditioning unit more frequently due to hearing noises in the home at night. Patient reported she checks the sink for leaks once a day. Patient stated, "pretty good" in response to current mood.   Interventions: Cognitive Behavioral Therapy. Clinician conducted session via caregility video from clinician's home office. Patient provided verbal consent to proceed with telehealth session and is aware of limitations of telephone or video visits. Patient participated in session from patient's home. Reviewed events since last session and discussed recent stressors. Discussed recent increase in anxiety. Assisted patient in exploring thoughts and feelings associated with increased anxiety during this time of year. Reviewed patient's thought record and recent entries. Assessed frequency and intensity of obsessive thoughts and compulsions. Provided reflective listening and validation. Clinician requested for homework patient continue thought record and identify evidence for and/or against thoughts documented.    Collaboration of Care: Other not required at this time   Diagnosis:  Obsessive-compulsive disorder Trichotillomania   Plan: Patient is to use CBT and coping skills to help manage decrease in symptoms associated with their diagnosis.   Frequency: Weekly  Modality: individual    Long-term goal:   decrease obsessive-compulsive behaviors to once per week as evidence by decrease in checking sink, mailbox, water heater, AC unit to once per week for at least 3 consecutive months    Target Date: 03/22/23  Progress: progressing    decrease feelings of  anger in response to decreased mobility and other medical conditions as evidenced by decrease in feelings of anger and irritability from 3-4 times per month to 1-2  times per month for 3 consecutive months   Target Date: 03/22/23  Progress: progressing    Short-term goal:  identify triggers and automatic thoughts associated with compulsive behaviors    Target Date: 05/01/23  Progress: progressing    identify coping strategies to utilize in response to decreased mobility and other medical conditions   Target Date: 05/01/23  Progress: progressing       Doree Barthel, LCSW

## 2023-02-26 NOTE — Progress Notes (Signed)
                Asante Blanda, LCSW 

## 2023-03-12 ENCOUNTER — Ambulatory Visit (INDEPENDENT_AMBULATORY_CARE_PROVIDER_SITE_OTHER): Payer: BC Managed Care – PPO | Admitting: Clinical

## 2023-03-12 DIAGNOSIS — F633 Trichotillomania: Secondary | ICD-10-CM | POA: Diagnosis not present

## 2023-03-12 DIAGNOSIS — F429 Obsessive-compulsive disorder, unspecified: Secondary | ICD-10-CM

## 2023-03-12 DIAGNOSIS — F422 Mixed obsessional thoughts and acts: Secondary | ICD-10-CM

## 2023-03-12 NOTE — Progress Notes (Signed)
[facility] Counselor/Therapist Progress Note  Patient ID: Shannon Brooks, MRN: [id number],    Date: 03/12/2023  Time Spent: 8:31am - 9:17am : 46 minutes    Treatment Type: Individual Therapy  Reported Symptoms: Patient reported recent obsessive thoughts and compulsions  Mental Status Exam: Appearance:  Neat     Behavior: Appropriate  Motor: Normal  Speech/Language:  Clear and Coherent  Affect: Appropriate  Mood: irritable  Thought process: tangential  Thought content:   Tangential  Sensory/Perceptual disturbances:   WNL  Orientation: oriented to person, place, and situation  Attention: Fair  Concentration: Fair  Memory: WNL  Fund of knowledge:  Good  Insight:   Good  Judgment:  Good  Impulse Control: Fair   Risk Assessment: Danger to Self:  No Patient denied current suicidal ideation  Self-injurious Behavior: No Danger to Others: No Patient denied current homicidal ideation Duty to Warn:no Physical Aggression / Violence:No  Access to Firearms a concern: No  Gang Involvement:No   Subjective: Patient reported her stepfather experiences difficulty managing his time and accepting accountability for his behaviors.  Patient reported her stepfather's behaviors are a trigger for changes in patient's mood. Patient reported she recognizes it would have been a better option for patient to leave the room when her stepfather entered the room during a recent interaction with her stepfather. Patient stated, "he comes in picking fights" in response to patient's recent interaction with her stepfather. Patient reported when she became frustrated during recent interaction with her stepfather, patient left her parents' home and went to her house. Patient reported she did not leave initially due to her mother's needs and the need to take care of her stepfather's wound. Patient reported she typically leaves the situation and goes home when there is conflict with her stepfather.  Patient stated, "a slight up tick with the sink" and reported an increase in checking the [facility] unit. Patient reported an increase in mouse activity in the [facility] unit during this time of year is a trigger for an increase in compulsions. Patient reported she is waking up to listen for the [facility] unit but is no longer getting up to check the [facility] unit. Patient stated, "at night that don't happen" in response to challenging obsessive thoughts and reported at night her priority is returning to sleep. Patient reported she practices challenging her thoughts during the day and stated, "I would say so" in regards to the benefit of challenging her thoughts. Patient stated, "I'm a little grumpy" in response to patient's current mood.   Interventions: Cognitive Behavioral Therapy and Supportive therapy.   Clinician conducted session via caregility video from clinician's home office. Patient provided verbal consent to proceed with telehealth session and is aware of limitations of telephone or video visits. Patient participated in session from patient's home. Provided supportive therapy as patient discussed recent interaction with her father and patient's response to their interaction. Assisted patient in exploring and identifying barriers to patient leaving prior to conflict with her father.  Explored communication strategies for patient to utilize in response to conflict with patient's father. Reviewed patient's thought record. Assessed the intensity and frequency of compulsions. Explored and identified triggers that contribute to an increase in obsessive thoughts and compulsions. Reviewed patient's implementation of challenging thought distortions/negative thoughts. Assessed patient's mood. Provided reflective listening and validation. Clinician requested for homework patient continue thought record and identify evidence for and/or against thoughts documented.      Collaboration of Care: Other not required at this time  Diagnosis:  Obsessive-compulsive disorder Trichotillomania   Plan: Patient is to use CBT and coping skills to help manage decrease in symptoms associated with their diagnosis.   Frequency: Weekly  Modality: individual    Long-term goal:   decrease obsessive-compulsive behaviors to once per week as evidence by decrease in checking sink, mailbox, water heater, AC unit to once per week for at least 3 consecutive months    Target Date: 03/22/23  Progress: progressing    decrease feelings of anger in response to decreased mobility and other medical conditions as evidenced by decrease in feelings of anger and irritability from 3-4 times per month to 1-2 times per month for 3 consecutive months   Target Date: 03/22/23  Progress: progressing    Short-term goal:  identify triggers and automatic thoughts associated with compulsive behaviors    Target Date: 05/01/23  Progress: progressing    identify coping strategies to utilize in response to decreased mobility and other medical conditions   Target Date: 05/01/23  Progress: progressing     Doree Barthel, LCSW

## 2023-03-12 NOTE — Progress Notes (Signed)
                Karen Sharpe, LCSW 

## 2023-03-27 ENCOUNTER — Ambulatory Visit: Payer: BC Managed Care – PPO | Admitting: Clinical

## 2023-03-27 DIAGNOSIS — F633 Trichotillomania: Secondary | ICD-10-CM | POA: Diagnosis not present

## 2023-03-27 DIAGNOSIS — F422 Mixed obsessional thoughts and acts: Secondary | ICD-10-CM

## 2023-03-27 DIAGNOSIS — F429 Obsessive-compulsive disorder, unspecified: Secondary | ICD-10-CM | POA: Diagnosis not present

## 2023-03-27 NOTE — Progress Notes (Signed)
[facility] Counselor/Therapist Progress Note  Patient ID: Shannon Brooks, MRN: [id number],    Date: 03/27/2023  Time Spent: 2:32pm - 3:19pm : 47 minutes  Treatment Type: Individual Therapy  Reported Symptoms: Patient reported recent compulsions and reported she pulled a "lock" of hair out two week ago in response to a cyst on her scalp.   Mental Status Exam: Appearance:  Neat and Well Groomed     Behavior: Appropriate  Motor: Normal  Speech/Language:  Clear and Coherent  Affect: Appropriate  Mood: normal  Thought process: normal  Thought content:   WNL  Sensory/Perceptual disturbances:   WNL  Orientation: oriented to person, place, and situation  Attention: Good  Concentration: Good  Memory: WNL  Fund of knowledge:  Good  Insight:   Good  Judgment:  Good  Impulse Control: Fair   Risk Assessment: Danger to Self:  No Patient denied current suicidal ideation  Self-injurious Behavior: No Danger to Others: No Patient denied current homicidal ideation Duty to Warn:no Physical Aggression / Violence:No  Access to Firearms a concern: No  Gang Involvement:No   Subjective: Patient reported while her husband was trying to repair patient's steam deck recently, her steam deck was damaged during the process. Patient reported she was able to get the steam deck replaced with a new steam deck that will arrive in 5-10 days. Patient stated, "I was a little salty with him" in regards to patient's response to her husband and the steam deck being damaged. Patient reported she plans to have a conversation with her husband this evening to discuss patient's feelings. Patient reported she feels her husband did not devote the same amount of energy to fixing patient's device as she would have devoted if she had broken one of her husband's devices.  Patient reported she was upset that her SD card for her device was broken during the repair. Patient reported her steam deck is a coping tool  patient utilizes when she is not physically feeling well. Patient reported she tries to utilize "I" statements as much as possible in conversations. Patient stated, "its better" in response to patient's current mood. Patient reported no increase in anxiety or compulsions in response to recent conflict with husband.    Interventions: Cognitive Behavioral Therapy. Clinician conducted session via caregility video from clinician's office at [facility]. Patient provided verbal consent to proceed with telehealth session and is aware of limitations of telephone or video visits. Patient participated in session from patient's home. Discussed recent conflict between patient/husband and explored patient's response to recent conflict. Assisted patient in exploring and identifying thoughts/feelings triggered by recent conflict with husband. Provided psycho education related to the use of "I" statements in upcoming conversation with husband. Assessed patient's current mood. Explored the impact of recent stressor on symptoms of anxiety. Provided reflective listening and validation. Clinician requested for homework patient continue thought record and identify evidence for and/or against thoughts documented.     Collaboration of Care: Other not required at this time   Diagnosis:  Obsessive-compulsive disorder Trichotillomania   Plan: Patient is to use CBT and coping skills to help manage decrease in symptoms associated with their diagnosis.   Frequency: Weekly  Modality: individual    Long-term goal:   decrease obsessive-compulsive behaviors to once per week as evidence by decrease in checking sink, mailbox, water heater, AC unit to once per week for at least 3 consecutive months    Target Date: 03/22/23  Progress: progressing    decrease feelings  of anger in response to decreased mobility and other medical conditions as evidenced by decrease in feelings of anger and irritability from 3-4 times per  month to 1-2 times per month for 3 consecutive months   Target Date: 03/22/23  Progress: progressing    Short-term goal:  identify triggers and automatic thoughts associated with compulsive behaviors    Target Date: 05/01/23  Progress: progressing    identify coping strategies to utilize in response to decreased mobility and other medical conditions   Target Date: 05/01/23  Progress: progressing    Doree Barthel, LCSW

## 2023-03-27 NOTE — Progress Notes (Signed)
                Karen Sharpe, LCSW 

## 2023-03-29 DIAGNOSIS — E782 Mixed hyperlipidemia: Secondary | ICD-10-CM | POA: Diagnosis not present

## 2023-03-29 DIAGNOSIS — E559 Vitamin D deficiency, unspecified: Secondary | ICD-10-CM | POA: Diagnosis not present

## 2023-04-04 DIAGNOSIS — E559 Vitamin D deficiency, unspecified: Secondary | ICD-10-CM | POA: Diagnosis not present

## 2023-04-04 DIAGNOSIS — G43109 Migraine with aura, not intractable, without status migrainosus: Secondary | ICD-10-CM | POA: Diagnosis not present

## 2023-04-04 DIAGNOSIS — J45909 Unspecified asthma, uncomplicated: Secondary | ICD-10-CM | POA: Diagnosis not present

## 2023-04-04 DIAGNOSIS — E782 Mixed hyperlipidemia: Secondary | ICD-10-CM | POA: Diagnosis not present

## 2023-04-09 ENCOUNTER — Ambulatory Visit: Payer: BC Managed Care – PPO | Admitting: Clinical

## 2023-04-09 DIAGNOSIS — F429 Obsessive-compulsive disorder, unspecified: Secondary | ICD-10-CM | POA: Diagnosis not present

## 2023-04-09 DIAGNOSIS — F633 Trichotillomania: Secondary | ICD-10-CM

## 2023-04-09 DIAGNOSIS — F422 Mixed obsessional thoughts and acts: Secondary | ICD-10-CM

## 2023-04-09 NOTE — Progress Notes (Signed)
[facility] Counselor/Therapist Progress Note  Patient ID: Dieu Minion, MRN: [id number],    Date: 04/09/2023  Time Spent: 9:33am - 10:25am : 52 minutes   Treatment Type: Individual Therapy  Reported Symptoms: Patient reported sleeping approximately 20 hours per day.   Mental Status Exam: Appearance:  Neat and Well Groomed     Behavior: Appropriate  Motor: Normal  Speech/Language:  Clear and Coherent  Affect: Appropriate  Mood: normal  Thought process: tangential  Thought content:   Tangential  Sensory/Perceptual disturbances:   WNL  Orientation: oriented to person, place, and situation  Attention: Fair  Concentration: Fair  Memory: WNL  Fund of knowledge:  Good  Insight:   Good  Judgment:  Good  Impulse Control: Fair   Risk Assessment: Danger to Self:  No Patient denied current suicidal ideation  Self-injurious Behavior: No Danger to Others: No Patient denied current homicidal ideation Duty to Warn:no Physical Aggression / Violence:No  Access to Firearms a concern: No  Gang Involvement:No   Subjective: Patient reported she had a conversation with her husband to discuss patient's thoughts/feelings vocalized during previous session. Patient reported her husband apologized and shared his thoughts/feelings related to the situation. Patient reported a recent increase in sleep and reported sleeping approximately 20 hours per day. Patient reported she feels increase in sleep is due to the change in the weather. Patient reported she experiences vertigo due to shifts in barometric pressure. Patient reported an increase in migraines in response to changes in weather. Patient reported concern recently when her father fell asleep immediately after parking his vehicle in the driveway. Patient reported she encouraged her father to talk with his physician and her father reached out to his physician while patient was present. During session, patient inquired if patient's  concern related to her father falling asleep in his vehicle was a situation in which patient was catastrophizing. Patient reported minimal feelings of anxiety, minimal negative thoughts, and minimal compulsions due to recent increase in sleep. Patient stated, "there wasn't a whole lot to record" in response to patient's thought record and reported minimal response was due to increase in sleep. Patient reported recently experiencing a "what if" thought and reported she immediately challenged the thought.   Interventions: Cognitive Behavioral Therapy. Clinician conducted session via caregility video from clinician's home office. Patient provided verbal consent to proceed with telehealth session and is aware of limitations of telephone or video visits. Patient participated in session from patient's home. Discussed patient's recent conversation with patient's husband and the outcome. Discussed recent increase in sleep and explored/identified contributing factors to increase in sleep. Assessed patient's current mood.  Discussed patient's concerns in response to patient's father falling asleep in his vehicle and provided psycho education related to rational and irrational thoughts. Assessed the intensity and frequency of anxiety and compulsions. Reviewed patient's thought record. Provided reflective listening. Validated patient's use of cognitive restructuring and self awareness. Clinician requested for homework patient continue thought record and identify evidence for and/or against thoughts documented.     Collaboration of Care: Other not required at this time   Diagnosis:  Obsessive-compulsive disorder Trichotillomania   Plan: Patient is to use CBT and coping skills to help manage decrease in symptoms associated with their diagnosis.   Frequency: Weekly  Modality: individual    Long-term goal:   decrease obsessive-compulsive behaviors to once per week as evidence by decrease in checking sink, mailbox,  water heater, AC unit to once per week for at least 3 consecutive months  Target Date: 05/01/23  Progress: progressing    decrease feelings of anger in response to decreased mobility and other medical conditions as evidenced by decrease in feelings of anger and irritability from 3-4 times per month to 1-2 times per month for 3 consecutive months   Target Date: 05/01/23  Progress: progressing    Short-term goal:  identify triggers and automatic thoughts associated with compulsive behaviors    Target Date: 05/01/23  Progress: progressing    identify coping strategies to utilize in response to decreased mobility and other medical conditions   Target Date: 05/01/23  Progress: progressing     Doree Barthel, LCSW

## 2023-04-09 NOTE — Progress Notes (Signed)
                Dezi Schaner, LCSW 

## 2023-04-23 ENCOUNTER — Ambulatory Visit (INDEPENDENT_AMBULATORY_CARE_PROVIDER_SITE_OTHER): Payer: BC Managed Care – PPO | Admitting: Clinical

## 2023-04-23 DIAGNOSIS — F422 Mixed obsessional thoughts and acts: Secondary | ICD-10-CM | POA: Diagnosis not present

## 2023-04-23 DIAGNOSIS — F633 Trichotillomania: Secondary | ICD-10-CM | POA: Diagnosis not present

## 2023-04-23 NOTE — Progress Notes (Signed)
                Dezi Schaner, LCSW 
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
## 2023-05-11 ENCOUNTER — Ambulatory Visit (INDEPENDENT_AMBULATORY_CARE_PROVIDER_SITE_OTHER): Payer: BC Managed Care – PPO | Admitting: Clinical

## 2023-05-11 DIAGNOSIS — F429 Obsessive-compulsive disorder, unspecified: Secondary | ICD-10-CM

## 2023-05-11 DIAGNOSIS — F422 Mixed obsessional thoughts and acts: Secondary | ICD-10-CM

## 2023-05-11 DIAGNOSIS — F633 Trichotillomania: Secondary | ICD-10-CM | POA: Diagnosis not present

## 2023-05-11 NOTE — Progress Notes (Signed)
                Dezi Schaner, LCSW 

## 2023-05-11 NOTE — Progress Notes (Signed)
[facility] Counselor/Therapist Progress Note  Patient ID: Shannon Brooks, MRN: [id number],    Date: 05/11/2023  Time Spent: 8:34am - 9:26am : 52 minutes  Treatment Type: Individual Therapy  Reported Symptoms: Patient reported sleeping 18 hours per day, decreased energy, and decreased appetite  Mental Status Exam: Appearance:  Neat and Well Groomed     Behavior: Appropriate  Motor: Normal  Speech/Language:  Clear and Coherent  Affect: Appropriate  Mood: normal  Thought process: normal  Thought content:   WNL  Sensory/Perceptual disturbances:   WNL  Orientation: oriented to person, place, and situation  Attention: Good  Concentration: Good  Memory: WNL  Fund of knowledge:  Good  Insight:   Good  Judgment:  Good  Impulse Control: Good   Risk Assessment: Danger to Self:  No Patient denied current suicidal ideation  Self-injurious Behavior: No Danger to Others: No Patient denied current homicidal ideation Duty to Warn:no Physical Aggression / Violence:No  Access to Firearms a concern: No  Gang Involvement:No   Subjective: Patient stated, "Lavenia Atlas got a Chief Operating Officer of stressors". Patient reported interactions with patient's father continue to be a stressor. Patient reported she has been sleeping 18 hours per day, experiencing decrease in energy, and reported her appetite has decreased due to medical conditions and medications.  Patient stated, "I get in a bad head space that I'm not doing anything for the house" and reported the thought occurs when she experiences a increase in sleep and decrease in energy. During today's session, patient identified multiple examples of evidence against patient's thought, such as, cleaning the house, managing household finances, coordination for family members, completing household repairs or coordinating household repairs, shopping for family members, managing their household while husband is on the road. Patient reported she found  "fluffy stuffies" that are designed for individuals to pull the hair out of the stuffed animal and purchased one to utilize in response to hair pulling. Patient stated, "better but maybe not there yet" in response to patient's first long term goal. Patient stated, "I can't tell where the irritability is coming from right now" in response to patient's second long term goal. Patient stated,  "probably still need to work on it some" in response to patient's first short term goal. Patient stated, "that ones going to be a big one happening once the move starts" in response to patient's second short term goal.   Interventions: Cognitive Behavioral Therapy. Clinician conducted session via caregility video from clinician's home office. Patient provided verbal consent to proceed with telehealth session and is aware of limitations of telephone or video visits. Patient participated in session from patient's home. Discussed current stressors and the impact on patient. Assisted patient in discussing and identifying thoughts/feelings triggered by medical conditions. Challenged statements/thoughts and interpretations to help patient reframe thoughts as it relates to patient's contributions to patient's household. Clinician requested during session patient practice identifying evidence for/against negative thoughts.  Reviewed patient's goals for therapy and patient's progress. Clinician requested for homework patient continue thought record and identify evidence for and/or against thoughts documented.    Collaboration of Care: Other not required at this time   Diagnosis:  Obsessive-compulsive disorder Trichotillomania   Plan: Patient is to use CBT and coping skills to help manage decrease in symptoms associated with their diagnosis.   Frequency: bi-weekly  Modality: individual    Long-term goal:   decrease obsessive-compulsive behaviors to once per week as evidence by decrease in checking sink, mailbox, water heater,  AC unit  to once per week for at least 3 consecutive months   Target Date: 11/09/23  Progress: progressing    decrease feelings of anger in response to decreased mobility and other medical conditions as evidenced by decrease in feelings of anger and irritability from 3-4 times per month to 1-2 times per month for 3 consecutive months   Target Date: 11/09/23  Progress: progressing    Short-term goal:  identify triggers and automatic thoughts associated with compulsive behaviors Patient stated, "I'd say we've done pretty good with those actually".   Target Date: 11/09/23  Progress: progressing    identify coping strategies to utilize in response to decreased mobility and other medical conditions   Target Date: 11/09/23  Progress: progressing     Doree Barthel, LCSW

## 2023-05-21 ENCOUNTER — Ambulatory Visit (INDEPENDENT_AMBULATORY_CARE_PROVIDER_SITE_OTHER): Payer: BC Managed Care – PPO | Admitting: Clinical

## 2023-05-21 DIAGNOSIS — F429 Obsessive-compulsive disorder, unspecified: Secondary | ICD-10-CM

## 2023-05-21 DIAGNOSIS — F633 Trichotillomania: Secondary | ICD-10-CM

## 2023-05-21 DIAGNOSIS — F422 Mixed obsessional thoughts and acts: Secondary | ICD-10-CM

## 2023-05-21 NOTE — Progress Notes (Signed)
                Dezi Schaner, LCSW 
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
## 2023-06-05 ENCOUNTER — Ambulatory Visit: Payer: BC Managed Care – PPO | Admitting: Clinical

## 2023-06-05 DIAGNOSIS — F422 Mixed obsessional thoughts and acts: Secondary | ICD-10-CM | POA: Diagnosis not present

## 2023-06-05 DIAGNOSIS — F633 Trichotillomania: Secondary | ICD-10-CM | POA: Diagnosis not present

## 2023-06-05 NOTE — Progress Notes (Signed)
                Dezi Schaner, LCSW 
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
## 2023-06-18 ENCOUNTER — Ambulatory Visit (INDEPENDENT_AMBULATORY_CARE_PROVIDER_SITE_OTHER): Payer: BC Managed Care – PPO | Admitting: Clinical

## 2023-06-18 DIAGNOSIS — F633 Trichotillomania: Secondary | ICD-10-CM | POA: Diagnosis not present

## 2023-06-18 DIAGNOSIS — F422 Mixed obsessional thoughts and acts: Secondary | ICD-10-CM | POA: Diagnosis not present

## 2023-06-18 NOTE — Progress Notes (Unsigned)
                Dezi Schaner, LCSW 

## 2023-06-18 NOTE — Progress Notes (Unsigned)
[facility] Counselor/Therapist Progress Note  Patient ID: Shannon Brooks, MRN: [id number],    Date: 06/18/2023  Time Spent: 8:33am - 9:24am : 51 minutes   Treatment Type: Individual Therapy  Reported Symptoms: none reported   Mental Status Exam: Appearance:  Neat and Well Groomed     Behavior: Appropriate  Motor: Normal  Speech/Language:  Clear and Coherent  Affect: Appropriate  Mood: normal  Thought process: circumstantial  Thought content:   WNL  Sensory/Perceptual disturbances:   WNL  Orientation: oriented to person, place, and situation  Attention: Fair  Concentration: Fair  Memory: WNL  Fund of knowledge:  Good  Insight:   Good  Judgment:  Good  Impulse Control: Good   Risk Assessment: Danger to Self:  No Patient denied current suicidal ideation  Self-injurious Behavior: No Danger to Others: No Patient denied current homicidal ideation Duty to Warn:no Physical Aggression / Violence:No  Access to Firearms a concern: No  Gang Involvement:No   Subjective: Patient reported patient's mother in law has been in the hospital for 2 weeks due to hip replacement surgery. Patient reported she has experienced a migraine and reported the relationship with her father continues to be strained. Patient stated, "Cordelia Pen (mother in law) going into the hospital, dad decided business as normal and address [place]". Patient stated, "I've just ignored him", "I've had peace, its been quiet".  Patient reported she is limiting her interactions with her father. Patient reported she is no longer providing wound 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 Patient stated, "I do not know" in response to patient's boundaries as it relates to patient's father. Patient stated, "yes I've been deflecting my emotions I know this". Patient reported she plans to remain in the kitchen during the thanksgiving holiday to limited her interactions with her father. Patient stated,  "I'm not going to be mean but I don't  have to be accommodating either" in reference to interactions with her father. Patient stated, "I know the limit to that (anger)". Patient reported she does "emotional checks" with her husband to determine if her response is appropriate for the situation. Patient identified the following warning signs of anger:  tightness in patient's chest, shortness of breath, crying, laughter in a different tone, change in facial expression. Patient stated, "pretty decent" in response to patient's current mood.   Interventions: Cognitive Behavioral Therapy and Interpersonal. Clinician conducted session via caregility video from clinician's home office. Patient provided verbal consent to proceed with telehealth session and is aware of limitations of telephone or video visits. Patient participated in session from patient's home. Reviewed events since last session and discussed recent stressors. Provided supportive therapy, active listening, and validation as patient discussed mother in law's recent hospitalization. Discussed patient's thoughts/feelings related to the status of the relationship with her father and explored the boundaries patient has established in response to recent conflict. Explored coping strategies for patient to utilize if conflict arises with father. Provided psycho education related to establishing healthy boundaries and warning signs of anger. Assisted patient in exploring and identifying warning signs of anger patient experiences. Clinician requested for homework patient continue thought record and identify evidence for and/or against thoughts documented, practice mindfulness, and journal patient's thoughts/feelings related to recent conflict.     Collaboration of Care: Other not required at this time   Diagnosis:  Obsessive-compulsive disorder Trichotillomania   Plan: Patient is to use CBT and coping skills to help manage decrease in symptoms associated with their diagnosis.   Frequency:  bi-weekly  Modality:  individual    Long-term goal:   decrease obsessive-compulsive behaviors to once per week as evidence by decrease in checking sink, mailbox, water heater, AC unit to once per week for at least 3 consecutive months   Target Date: 11/09/23  Progress: progressing    decrease feelings of anger in response to decreased mobility and other medical conditions as evidenced by decrease in feelings of anger and irritability from 3-4 times per month to 1-2 times per month for 3 consecutive months   Target Date: 11/09/23  Progress: progressing    Short-term goal:  identify triggers and automatic thoughts associated with compulsive behaviors Patient stated, "I'd say we've done pretty good with those actually".   Target Date: 11/09/23  Progress: progressing    identify coping strategies to utilize in response to decreased mobility and other medical conditions   Target Date: 11/09/23  Progress: progressing    Doree Barthel, LCSW

## 2023-07-01 ENCOUNTER — Other Ambulatory Visit: Payer: Self-pay | Admitting: Obstetrics & Gynecology

## 2023-07-02 ENCOUNTER — Ambulatory Visit: Payer: BC Managed Care – PPO | Admitting: Clinical

## 2023-07-02 DIAGNOSIS — F633 Trichotillomania: Secondary | ICD-10-CM | POA: Diagnosis not present

## 2023-07-02 DIAGNOSIS — F429 Obsessive-compulsive disorder, unspecified: Secondary | ICD-10-CM | POA: Diagnosis not present

## 2023-07-02 DIAGNOSIS — F422 Mixed obsessional thoughts and acts: Secondary | ICD-10-CM

## 2023-07-02 NOTE — Progress Notes (Signed)
                Dezi Schaner, LCSW 

## 2023-07-02 NOTE — Progress Notes (Signed)
[facility] Counselor/Therapist Progress Note  Patient ID: Shannon Brooks, MRN: [id number],    Date: 07/02/2023  Time Spent: 8:35am - 9:21am : 46 minutes   Treatment Type: Individual Therapy  Reported Symptoms: obsessive thoughts and compulsions  Mental Status Exam: Appearance:  Neat and Well Groomed     Behavior: Appropriate  Motor: Normal  Speech/Language:  Clear and Coherent  Affect: Appropriate  Mood: normal  Thought process: normal  Thought content:   WNL  Sensory/Perceptual disturbances:   WNL  Orientation: oriented to person, place, and situation  Attention: Good  Concentration: Good  Memory: WNL  Fund of knowledge:  Good  Insight:   Good  Judgment:  Good  Impulse Control: Good   Risk Assessment: Danger to Self:  No Patient denied current suicidal ideation  Self-injurious Behavior: No Danger to Others: No Patient denied current homicidal ideation Duty to Warn:no Physical Aggression / Violence:No  Access to Firearms a concern: No  Gang Involvement:No   Subjective: Patient stated, "I've been babysitting my taps (water pipes) since Monday".  Patient reported she is running the water in her home to prevent the plumbing from cracking. Patient reported she is concerned her plumbing is frozen. Patient stated, "I'm actually really proud of myself, I'm only checking on it every couple of hours and I actually slept last night". Patient reported she was able to sleep last night and stated, "because I had seen progress". Patient stated,  "shockingly calm" in reference to family dynamics during thanksgiving meal. Patient stated,  "pretty good", "the only thing that has been bugging me has been under the sink" in response to patient's mood and reported concern related potential plumbing repairs. Patient reported she is concerned about pulling the hair from her eye brows after plumbing issues are resolved. Patient reported she has been experiencing the thought, "If I  don't get everything done right for Christmas, Drew's family is going to hate me".  Patient stated, "I did" in response to challenging the thought and stated, "It didn't make it fully go away". Patient stated, "to be honest I kind of forgot about it" in reference to mindfulness exercise.   Interventions: Cognitive Behavioral Therapy. Clinician conducted session via caregility video from clinician's home office. Patient provided verbal consent to proceed with telehealth session and is aware of limitations of telephone or video visits. Patient participated in session from patient's home. Reviewed events since last session. Discussed current stressors related to patient's home and the physical and emotional impact on patient. Discussed the status of family dynamics during recent holiday meal. Assessed patient's mood. Reviewed coping strategies for patient to utilize in response the impulse to pull patient's eye brows/lashes. Reviewed patient's thought record, reviewed patient's implementation of challenging cognitive distortions, and discussed the outcome. Reviewed mindfulness and explored barriers to patient practicing mindfulness exercise. Clinician requested for homework patient continue thought record and identify evidence for and/or against thoughts documented and practice mindfulness.    Collaboration of Care: Other not required at this time   Diagnosis:  Obsessive-compulsive disorder Trichotillomania   Plan: Patient is to use CBT and coping skills to help manage decrease in symptoms associated with their diagnosis.   Frequency: bi-weekly  Modality: individual    Long-term goal:   decrease obsessive-compulsive behaviors to once per week as evidence by decrease in checking sink, mailbox, water heater, AC unit to once per week for at least 3 consecutive months   Target Date: 11/09/23  Progress: progressing    decrease feelings of  anger in response to decreased mobility and other medical  conditions as evidenced by decrease in feelings of anger and irritability from 3-4 times per month to 1-2 times per month for 3 consecutive months   Target Date: 11/09/23  Progress: progressing    Short-term goal:  identify triggers and automatic thoughts associated with compulsive behaviors Patient stated, "I'd say we've done pretty good with those actually".   Target Date: 11/09/23  Progress: progressing    identify coping strategies to utilize in response to decreased mobility and other medical conditions   Target Date: 11/09/23  Progress: progressing       Doree Barthel, LCSW

## 2023-07-16 ENCOUNTER — Ambulatory Visit: Payer: BC Managed Care – PPO | Admitting: Clinical

## 2023-07-16 DIAGNOSIS — F422 Mixed obsessional thoughts and acts: Secondary | ICD-10-CM | POA: Diagnosis not present

## 2023-07-16 DIAGNOSIS — F633 Trichotillomania: Secondary | ICD-10-CM

## 2023-07-16 NOTE — Progress Notes (Signed)
[facility] Counselor/Therapist Progress Note  Patient ID: Shannon Brooks, MRN: [id number],    Date: 07/16/2023  Time Spent: 8:34am - 9:18am : 44 minutes  Treatment Type: Individual Therapy  Reported Symptoms: obsessive thoughts, compulsions  Mental Status Exam: Appearance:  Neat and Well Groomed     Behavior: Crocheting during session  Motor: Normal  Speech/Language:  Clear and Coherent  Affect: Appropriate  Mood: Patient reported "grumpy" mood  Thought process: normal  Thought content:   Obsessions  Sensory/Perceptual disturbances:   WNL  Orientation: oriented to person, place, and situation  Attention: Fair  Concentration: Fair  Memory: WNL  Fund of knowledge:  Good  Insight:   Good  Judgment:  Good  Impulse Control: Good   Risk Assessment: Danger to Self:  No Patient denied current suicidal ideation  Self-injurious Behavior: No Danger to Others: No Patient denied current homicidal ideation Duty to Warn:no Physical Aggression / Violence:No  Access to Firearms a concern: No  Gang Involvement:No   Subjective: Patient stated, "it hasn't been bad" in response to events since last session. Patient stated, "I am exhausted, I am physically wiped". Patient reported fatigue has been triggered by holiday preparations.  Patient reported two days after patient's last therapy session patient's husband discovered the heater in their well pump had been turned off. Patient stated, "there have been no problems since", "we did the water pressure test", "we are fine". Patient reported there is a water stain on a board at their well pump and patient reported she has been experiencing the thought and compulsion to check for a leak. Patient reported she utilized the mindfulness exercise of leaves on a stream and reported the exercise was not beneficial due to the thought of water in the exercise.  Patient stated, "I can't shut the thought up, but I have curtailed the action".  Patient reported no evidence to support the thought and identified several examples of evidence against the thought related to a possible leak. Patient reported patient/husband plan to spend several hours with her family and then spend the day with her husband's family for the holiday. Patient stated, "I have done a lot of sleeping which has made me grumpy because its put me behind". Patient reported "grumpy" mood today.   Interventions: Cognitive Behavioral Therapy. Clinician conducted session via caregility video from clinician's home office. Patient provided verbal consent to proceed with telehealth session and is aware of limitations of telephone or video visits. Patient participated in session from patient's home. Reviewed events since last session. Explored and identified triggers for fatigue and obsessive thoughts. Reviewed patient's implementation of mindfulness exercise and the outcome. Assisted patient in challenging obsessive thoughts related to a potential leak during session. Explored patient's strategy to navigate family dynamics during the upcoming holiday. Assessed patient's mood. Clinician requested for homework patient continue thought record and identify evidence for and/or against thoughts documented and practice mindfulness.   Collaboration of Care: Other not required at this time   Diagnosis:  Obsessive-compulsive disorder Trichotillomania   Plan: Patient is to use CBT and coping skills to help manage decrease in symptoms associated with their diagnosis.   Frequency: bi-weekly  Modality: individual    Long-term goal:   decrease obsessive-compulsive behaviors to once per week as evidence by decrease in checking sink, mailbox, water heater, AC unit to once per week for at least 3 consecutive months   Target Date: 11/09/23  Progress: progressing    decrease feelings of anger in response to decreased mobility  and other medical conditions as evidenced by decrease in feelings of  anger and irritability from 3-4 times per month to 1-2 times per month for 3 consecutive months   Target Date: 11/09/23  Progress: progressing    Short-term goal:  identify triggers and automatic thoughts associated with compulsive behaviors Patient stated, "I'd say we've done pretty good with those actually".   Target Date: 11/09/23  Progress: progressing    identify coping strategies to utilize in response to decreased mobility and other medical conditions   Target Date: 11/09/23  Progress: progressing       Doree Barthel, LCSW

## 2023-07-16 NOTE — Progress Notes (Signed)
                Dezi Schaner, LCSW 

## 2023-07-30 ENCOUNTER — Ambulatory Visit: Payer: BC Managed Care – PPO | Admitting: Clinical

## 2023-07-30 DIAGNOSIS — F633 Trichotillomania: Secondary | ICD-10-CM | POA: Diagnosis not present

## 2023-07-30 DIAGNOSIS — F429 Obsessive-compulsive disorder, unspecified: Secondary | ICD-10-CM

## 2023-07-30 DIAGNOSIS — F422 Mixed obsessional thoughts and acts: Secondary | ICD-10-CM

## 2023-07-30 NOTE — Progress Notes (Signed)
 [facility] Counselor/Therapist Progress Note  Patient ID: Shannon Brooks, MRN: [id number],    Date: 07/30/2023  Time Spent: 8:33am - 9:18am : 45 minutes   Treatment Type: Individual Therapy  Reported Symptoms: Patient reported recent increase in sleep and fatigue  Mental Status Exam: Appearance:  Neat and Well Groomed     Behavior: Appropriate  Motor: Normal  Speech/Language:  Clear and Coherent  Affect: Appropriate  Mood: normal  Thought process: circumstantial  Thought content:   WNL  Sensory/Perceptual disturbances:   WNL  Orientation: oriented to person, place, and situation  Attention: Fair  Concentration: Fair  Memory: WNL  Fund of knowledge:  Good  Insight:   Good  Judgment:  Good  Impulse Control: Good   Risk Assessment: Danger to Self:  No Patient denied current suicidal ideation   Self-injurious Behavior: No Danger to Others: No Patient denied current homicidal ideation Duty to Warn:no Physical Aggression / Violence:No  Access to Firearms a concern: No  Gang Involvement:No   Subjective: Patient stated, honestly Christmas was good, Christmas was chill in response to events since last session. Patient stated, I've been asleep in response to events since last session. Patient reported she finished making Christmas gifts at 9pm on Christmas eve. Patient reported increased sleep since the Christmas holiday due to increased activity during the holiday. Patient stated, I'm still playing catch up in reference to sleep. Patient stated, I haven't had a mood, I've been asleep. Patient stated, it was fine until my husband did what I asked him not to do in response to obsessive thoughts of water pipes freezing. Patient reported after her husband left for work patient turned on the water and the water pressure dropped. Patient reported she was able to resolve the issue and stated, its been fine for a few days. Patient stated, I have not had that much  of the urge in reference to compulsions to check the pipes. Patient stated, there's been a significant uptick in checking the sink. Patient reported she has been checking the sink due to patient constantly running the water to prevent the pipes from freezing. Patient reported the last time she ran the water to prevent the pipes from freezing there was a leak underneath the sink. Patient stated, I resisted the impulsive to go in and check the well. Patient reported she hopes the compulsions will go away when patient/husband purchase their new home.   Interventions: Cognitive Behavioral Therapy. Clinician conducted session via caregility video from clinician's home office. Patient provided verbal consent to proceed with telehealth session and is aware of limitations of telephone or video visits. Patient participated in session from patient's home. Reviewed events since last session. Discussed recent increase in sleep and explored/identified triggers for recent increase in sleep. Assessed patient's mood since last session and current mood. Assessed frequency and intensity of obsessive thoughts and compulsions. Explored and identified triggers for recent increase in compulsions to check the sink. Clinician requested for homework patient continue thought record and identify evidence for and/or against thoughts documented and practice mindfulness.   Collaboration of Care: Other not required at this time   Diagnosis:  Obsessive-compulsive disorder Trichotillomania   Plan: Patient is to use CBT and coping skills to help manage decrease in symptoms associated with their diagnosis.   Frequency: bi-weekly  Modality: individual    Long-term goal:   decrease obsessive-compulsive behaviors to once per week as evidence by decrease in checking sink, mailbox, water heater, AC unit to once per week  for at least 3 consecutive months   Target Date: 11/09/23  Progress: progressing    decrease feelings of anger  in response to decreased mobility and other medical conditions as evidenced by decrease in feelings of anger and irritability from 3-4 times per month to 1-2 times per month for 3 consecutive months   Target Date: 11/09/23  Progress: progressing    Short-term goal:  identify triggers and automatic thoughts associated with compulsive behaviors Patient stated, I'd say we've done pretty good with those actually.   Target Date: 11/09/23  Progress: progressing    identify coping strategies to utilize in response to decreased mobility and other medical conditions   Target Date: 11/09/23  Progress: progressing       [name], LCSW

## 2023-07-30 NOTE — Progress Notes (Signed)
   Shannon Barthel, LCSW

## 2023-08-14 ENCOUNTER — Ambulatory Visit: Payer: BC Managed Care – PPO | Admitting: Clinical

## 2023-08-14 DIAGNOSIS — F429 Obsessive-compulsive disorder, unspecified: Secondary | ICD-10-CM

## 2023-08-14 DIAGNOSIS — F633 Trichotillomania: Secondary | ICD-10-CM

## 2023-08-14 DIAGNOSIS — F422 Mixed obsessional thoughts and acts: Secondary | ICD-10-CM

## 2023-08-14 NOTE — Progress Notes (Signed)
[facility] Counselor/Therapist Progress Note  Patient ID: Shannon Brooks, MRN: [id number],    Date: 08/14/2023  Time Spent: 9:32am -10:24am : 52 minutes   Treatment Type: Individual Therapy  Reported Symptoms: Patient reported worry, fear, sadness  Mental Status Exam: Appearance:  Disheveled     Behavior: Appropriate  Motor: Normal  Speech/Language:  Clear and Coherent  Affect: Appropriate  Mood: Patient stated, "kind of all over the place"   Thought process: normal  Thought content:   WNL  Sensory/Perceptual disturbances:   WNL  Orientation: oriented to person, place, and situation  Attention: Good  Concentration: Good  Memory: WNL  Fund of knowledge:  Good  Insight:   Good  Judgment:  Good  Impulse Control: Good   Risk Assessment: Danger to Self:  No Patient denied current suicidal ideation   Self-injurious Behavior: No Danger to Others: No Patient denied current homicidal ideation Duty to Warn:no Physical Aggression / Violence:No  Access to Firearms a concern: No  Gang Involvement:No   Subjective: Patient stated, "good" in response to events since last session. Patient stated, "I've had some successes and no successes".  Patient reported she has experienced issues with ice in the water lines and had a conversation with her husband regarding the water line issues. Patient reported patient's husband was questioning patient's research regarding the water lines and patient reported she had a conversation with her husband to express patient's thoughts/feelings in response to husband questioning patient's research. Patient reported her husband obtained materials to insulate the water lines outside. Patient stated, "I didn't freak out, I didn't gasp, I didn't lock up when he (husband) said a couple of swear words", "I didn't even have a panic attack when the pipe was moved". Patient reported increased worry due to the installation of an extra heater in their well  house.  Patient reported she worries a mouse is going to bite a cord and cause an electrical fire in the well house. Patient reported "what if it shorts and cuts off the well pump". Patient stated, "I saw the mouse" in response to evidence to support patient's thoughts/worry. Patient reported concern that the well house is not up to code. Patient stated,  "I'm chalking this one up to be 99% irrational" in reference to worry about a fire.  Patient reported she has considered resuming journaling/writing and purchased a tablet for journaling/writing. Patient reported patient's step father's friend recently passed away. Patient reported she has not had an opportunity to mourn the loss of step father's friend due to supporting her stepfather. Patient reported stepfather's friend has been a "fixture" in patient's life. Patient stated, "I'm sad but this was not a surprise".    Interventions: Cognitive Behavioral Therapy. Clinician conducted session via caregility video from clinician's office at [facility]. Patient provided verbal consent to proceed with telehealth session and is aware of limitations of telephone or video visits. Patient participated in session from patient's home. Reviewed events since last session. Reviewed patient's thought record entries. Discussed recent conversation with patient's husband and the outcome. Explored and identified thoughts/feelings triggered by cold temperatures and water lines. Challenged cognitive distortions and assisted patient in practicing challenging cognitive distortions related to water lines and fear of fire. Provided psycho education related to cycle of anxiety. Provided psycho education related to use of a worry journal and postponing worry. Provided supportive therapy, active listening, and validation as patient discussed recent loss.  Clinician requested for homework patient continue thought record and identify evidence  for and/or against thoughts documented  and practice mindfulness, worry journal.    Collaboration of Care: Other not required at this time   Diagnosis:  Obsessive-compulsive disorder Trichotillomania   Plan: Patient is to use CBT and coping skills to help manage decrease in symptoms associated with their diagnosis.   Frequency: bi-weekly  Modality: individual    Long-term goal:   decrease obsessive-compulsive behaviors to once per week as evidence by decrease in checking sink, mailbox, water heater, AC unit to once per week for at least 3 consecutive months   Target Date: 11/09/23  Progress: progressing    decrease feelings of anger in response to decreased mobility and other medical conditions as evidenced by decrease in feelings of anger and irritability from 3-4 times per month to 1-2 times per month for 3 consecutive months   Target Date: 11/09/23  Progress: progressing    Short-term goal:  identify triggers and automatic thoughts associated with compulsive behaviors Patient stated, "I'd say we've done pretty good with those actually".   Target Date: 11/09/23  Progress: progressing    identify coping strategies to utilize in response to decreased mobility and other medical conditions   Target Date: 11/09/23  Progress: progressing    Doree Barthel, LCSW

## 2023-08-14 NOTE — Progress Notes (Signed)
                Dezi Schaner, LCSW 

## 2023-08-27 ENCOUNTER — Ambulatory Visit (INDEPENDENT_AMBULATORY_CARE_PROVIDER_SITE_OTHER): Payer: BC Managed Care – PPO | Admitting: Clinical

## 2023-08-27 DIAGNOSIS — F633 Trichotillomania: Secondary | ICD-10-CM | POA: Diagnosis not present

## 2023-08-27 DIAGNOSIS — F429 Obsessive-compulsive disorder, unspecified: Secondary | ICD-10-CM | POA: Diagnosis not present

## 2023-08-27 DIAGNOSIS — F422 Mixed obsessional thoughts and acts: Secondary | ICD-10-CM
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
## 2023-08-27 NOTE — Progress Notes (Signed)
   Shannon Barthel, LCSW

## 2023-09-05 DIAGNOSIS — M25511 Pain in right shoulder: Secondary | ICD-10-CM | POA: Diagnosis not present

## 2023-09-10 ENCOUNTER — Ambulatory Visit: Payer: BC Managed Care – PPO | Admitting: Clinical

## 2023-09-10 DIAGNOSIS — F633 Trichotillomania: Secondary | ICD-10-CM

## 2023-09-10 DIAGNOSIS — F422 Mixed obsessional thoughts and acts: Secondary | ICD-10-CM

## 2023-09-10 DIAGNOSIS — F429 Obsessive-compulsive disorder, unspecified: Secondary | ICD-10-CM | POA: Diagnosis not present

## 2023-09-10 NOTE — Progress Notes (Signed)
   Doree Barthel, LCSW
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
## 2023-09-12 DIAGNOSIS — M25511 Pain in right shoulder: Secondary | ICD-10-CM | POA: Diagnosis not present

## 2023-09-26 DIAGNOSIS — E559 Vitamin D deficiency, unspecified: Secondary | ICD-10-CM | POA: Diagnosis not present

## 2023-09-26 DIAGNOSIS — E782 Mixed hyperlipidemia: Secondary | ICD-10-CM | POA: Diagnosis not present

## 2023-09-27 ENCOUNTER — Ambulatory Visit: Payer: Medicare Other | Admitting: Neurology

## 2023-09-28 ENCOUNTER — Ambulatory Visit: Payer: Medicare Other | Admitting: Clinical

## 2023-09-28 DIAGNOSIS — F422 Mixed obsessional thoughts and acts: Secondary | ICD-10-CM

## 2023-09-28 DIAGNOSIS — F429 Obsessive-compulsive disorder, unspecified: Secondary | ICD-10-CM | POA: Diagnosis not present

## 2023-09-28 DIAGNOSIS — F633 Trichotillomania: Secondary | ICD-10-CM

## 2023-09-28 NOTE — Progress Notes (Signed)
   Shannon Barthel, LCSW

## 2023-09-28 NOTE — Progress Notes (Signed)
 [facility] Counselor/Therapist Progress Note  Patient ID: Shannon Brooks, MRN: [id number],    Date: 09/28/2023  Time Spent: 9:34am - 10:20am: 46 minutes   Treatment Type: Individual Therapy  Reported Symptoms: worry, fatigue  Mental Status Exam: Appearance:  Neat and Well Groomed     Behavior: Appropriate  Motor: Normal  Speech/Language:  Clear and Coherent and Normal Rate  Affect: Appropriate  Mood: Patient stated, "I'm too tired to have moods right now"  Thought process: normal  Thought content:   WNL  Sensory/Perceptual disturbances:   WNL  Orientation: oriented to person, place, and situation  Attention: Good  Concentration: Good  Memory: WNL  Fund of knowledge:  Good  Insight:   Good  Judgment:  Good  Impulse Control: Good   Risk Assessment: Danger to Self:  No Patient denied current suicidal ideation  Self-injurious Behavior: No Danger to Others: No Patient denied current homicidal ideation Duty to Warn:no Physical Aggression / Violence:No  Access to Firearms a concern: No  Gang Involvement:No   Subjective: Patient stated, "I lost an eye lid" in response to events since last session. Patient reported "stressing over not getting my MRI results back yet" was a trigger for patient recently pulling the hair from patient's eye lid. Patient reported she has an appointment with the orthopedic provider Monday and stated, "I want to know now" in reference to MRI results. Patient stated, "I understand that I am being unreasonable here".  Patient reported the MRI staff provided patient with ear plugs to utilize during the MRI and allowed patient to hold patient's stress ball during the MRI. Patient reported holding the stress ball and using the ear plugs were helpful.  Patient stated, "it was a much different experience", "more pleasant", "more support from the staff" in reference to patient's recent experience during MRI. Patient reported feeling worry regarding  the results of patient's MRI and reported she has been experiencing shoulder pain.  Patient stated, "its the not knowing, I don't like not knowing". Patient stated, "not knowing if I was being taken serious by my doctors" in reference to thoughts associated with previous experience. Patient stated, "I'm not seeing any" in reference to evidence to support patient's thoughts. Patient stated, "I know logically everything is fine". Patient reported she has been challenging her thoughts. Patient reported when experiencing a physiological response to worry patient checked for an email containing MRI results. Patient stated, "I'm too tired to have moods right now" in response to patient's current mood.  Interventions: Cognitive Behavioral Therapy. Clinician conducted session via caregility video from clinician's home office. Patient provided verbal consent to proceed with telehealth session and is aware of limitations of telephone or video visits. Patient participated in session from patient's home. Reviewed events since last session. Assisted patient in exploring and identifying triggers for patient pulling hair from patient's eye lid. Discussed patient's recent MRI, reviewed coping strategies previously discussed to utilize during MRI and the outcome. Explored and identifying thoughts/feelings triggered by pending MRI results. Assisted patient in practicing challenging cognitive distortions and identifying evidence for/against patient's fear of not being taken seriously. Explored strategies patient has implemented in response to physiological symptoms associated with worry. Reviewed the use of deep breathing exercises and relaxation techniques in response to physiological symptoms. Assessed patient's mood. Clinician requested for homework patient continue thought record and identify evidence for and/or against thoughts documented and practice mindfulness, worry journal.  Collaboration of Care: Other not required at  this time   Diagnosis:  Obsessive-compulsive disorder Trichotillomania   Plan: Patient is to use CBT and coping skills to help manage decrease in symptoms associated with their diagnosis.   Frequency: bi-weekly  Modality: individual    Long-term goal:   decrease obsessive-compulsive behaviors to once per week as evidence by decrease in checking sink, mailbox, water heater, AC unit to once per week for at least 3 consecutive months   Target Date: 11/09/23  Progress: progressing    decrease feelings of anger in response to decreased mobility and other medical conditions as evidenced by decrease in feelings of anger and irritability from 3-4 times per month to 1-2 times per month for 3 consecutive months   Target Date: 11/09/23  Progress: progressing    Short-term goal:  identify triggers and automatic thoughts associated with compulsive behaviors  Target Date: 11/09/23  Progress: progressing    identify coping strategies to utilize in response to decreased mobility and other medical conditions   Target Date: 11/09/23  Progress: progressing     Doree Barthel, LCSW

## 2023-10-01 DIAGNOSIS — M7541 Impingement syndrome of right shoulder: Secondary | ICD-10-CM | POA: Diagnosis not present

## 2023-10-02 NOTE — Progress Notes (Unsigned)
 PATIENT: Shannon Brooks DOB: 10/26/1985  REASON FOR VISIT: follow up for chronic migraine headache HISTORY FROM: patient PRIMARY NEUROLOGIST: Dr. Terrace Arabia   HISTORY Shannon Brooks is a 38 years old female, seen in refer by his primary care doctor Shannon Brooks for evaluation of dizziness, initial evaluation was on April 10 2017.   I reviewed and summarized the referring note, she had a past medical history of chronic migraine, obese, presented with frequent vertigo with associated headache since 2017    She had migraine headaches since 38 years old, her typical migraine are lateralized severe pounding headache with associated light noise sensitivity, nauseous, lasting for a few hours, was helped by caffeine, over-the-counter Excedrin Migraine, Tylenol ibuprofen as needed, sometimes her migraines are preceded by auras, visual distortion, dizziness,   She also complains of intermittent vertigo episodes since 2013, gradually getting worse especially since 2017, over the past few months, she has it almost on a weekly basis, sudden onset of unsteady gait, difficulty focusing, brain foggy sensation, lasting for few hours, caffeine usually helps her symptoms temporarily, oftentimes is followed by severe migraine headaches,   Stopping birth control in March 2018 seems to make her symptoms worse    Update July 10, 2017: She is now taking Topamax 100 mg twice a day, doing very well, lost 11 pounds over past 3 months, Imitrex as needed works well for her migraine headaches, and recurrent dizzy spells, continue have recurrent spells of lightheadedness, whole body tremor, without loss consciousness lasting for a few minutes, not necessarily associated with headaches,   UPDATE September 30 2018: She is over all happy with current migraine control, she is taking Topamax 200mg  daily, magnesium oxide, riboflavin 100mg  twice a day as preventive medications.     She has migraine once a week,  Sometimes  precede by dizziness, sleeping would help.     Imitrex 50mg  as needed was helpful.   She also came with a list of constellation of complaints, frequent body tremor, disturbance spells, staring at the computer screen,Watching TV, increased brain fog   EEG was normal in Dec 2018   Update Dec 17, 2019 SS: Overall happy with current migraine control, taking Topamax 100 mg twice a day, magnesium oxide, riboflavin for preventative medications.  Has headache with pain about once a month, will take Imitrex with good benefit.  Has daily dizziness/vertigo vision, "wake up on the boat", it may worsen throughout the day.  She carries a cane just in case.  She is now on disability.  She may have occasional sensation of cold or heat to crown of head during spells.  Daily dizzy spells have been chronic.  Here with husband, not planning to start a family currently.  Has close follow-up with PCP, found to have low vitamin D.  Topamax makes symptoms manageable.  Update Dec 21, 2020 SS: Sees hematology at [facility] for thrombocytosis, vitamin D level has been low, on prescription strength, did trial off Vitamin D, noticed significant worsening in memory, gained 13 lbs, was sleeping a lot. Is back on D2, feeling great now. Rare migraines, noted significant worsening in vertigo when off the D2. Only 4 Imitrex a year, rapid onset, notes worst with menstrual cycle and barometric pressure change. Has dizziness from minute she gets up, topamax "mutes it". Some visual hallucinations rare, could be migraine or side effect from Topamax? Doesn't drive further than 5 minutes from home. Also on magnesium/riboflavin. Has cane if needed.  Update Dec 14, 2021 SS:  Remains on Topamax 100 mg twice daily, typically once a month gets migraine with pain, barometric pressure/hormones are triggers. Had fallopian tubes removed in October, still on progesterone birth control for hormones. Without Topamax, is very dizzy, immobile. Takes Imitrex at onset  of headache with good benefit. In past on Paxil. Still has dizziness daily that is part of her headache process, really impacts her life, ability to be independent.   Update June 20, 2022 SS: Had side effect with Effexor, remains on Topamax 100 mg twice daily. Is in a good period of time, this a a 4 week good period before rainy weather season. Takes Imitrex once a month for headache, it helps. Has daily dizzy aura.   Update January 24, 2023 SS: Here with husband, disability is up for review, on it since 2020. Has about 4 good hours in a day, other hours feels like she is on a boat. Has been felt to be migraine with feeling of vertigo. Did Shannon Brooks since Nov, reports increased her cystic acne all over, heat rashes and couldn't sweat. No change with Qulipta. On Topamax 100 mg twice daily. About every month will have traditional migraine with pain around period. With daily vertigo, all migraine features, extremely photosensitive, spends a lot of time in bed. Has seen ENT, determined no ENT etiology of symptoms. Doesn't want to make any more changes, stress going helping her mother recovering from knee surgery. Reports frequent falls.   Update October 03, 2023 SS: Remains on Topamax 100 mg BID, worse vertigo with spring time. With the Topamax able to get out of bed, stays dizzy. Only takes Imitrex with head pain, few times a year. Has had chronic dizziness since 2013. Doesn't want to know what life is like without Topamax. Has daily dizzy aura.   REVIEW OF SYSTEMS: Out of a complete 14 system review of symptoms, the patient complains only of the following symptoms, and all other reviewed systems are negative.  See HPI  ALLERGIES: Allergies  Allergen Reactions   Penicillins     Was told she was allergic but has taken, amoxicillin with no reaction   Desipramine Rash    HOME MEDICATIONS: Outpatient Medications Prior to Visit  Medication Sig Dispense Refill   acetaminophen (TYLENOL) 325 MG tablet Take  650 mg by mouth every 6 (six) hours as needed for moderate pain.     albuterol (PROVENTIL HFA;VENTOLIN HFA) 108 (90 BASE) MCG/ACT inhaler Inhale 2 puffs into the lungs every 6 (six) hours as needed for wheezing or shortness of breath.      atorvastatin (LIPITOR) 20 MG tablet Take 20 mg by mouth daily.     cetirizine (ZYRTEC) 10 MG tablet Chew 10 mg by mouth daily.     chlorpheniramine (CHLOR-TRIMETON) 4 MG tablet Take 4 mg by mouth in the morning and at bedtime.     Cholecalciferol (VITAMIN D) 50 MCG (2000 UT) tablet Take 2,000 Units by mouth daily.     ergocalciferol (VITAMIN D2) 1.25 MG (50000 UT) capsule Take 50,000 Units by mouth once a week.     ferrous sulfate 325 (65 FE) MG tablet Take 325 mg by mouth daily with breakfast.     guaifenesin (HUMIBID E) 400 MG TABS tablet Take 400 mg by mouth in the morning and at bedtime.     hyoscyamine (LEVSIN) 0.125 MG tablet Take 0.125 mg by mouth every 6 (six) hours as needed (diarrhea).     ibuprofen (ADVIL,MOTRIN) 800 MG tablet Take 800 mg by mouth  at bedtime.     magnesium oxide (MAG-OX) 400 MG tablet Take 400 mg by mouth 2 (two) times daily.     Menthol, Topical Analgesic, (BIOFREEZE EX) Apply 1 application topically daily as needed (pain).     norethindrone (MICRONOR) 0.35 MG tablet Take 1 tablet by mouth once daily 56 tablet 5   Phenylephrine HCl 5 MG TABS Take 5 mg by mouth in the morning and at bedtime.     Potassium 99 MG TABS Take 99 mg by mouth daily.     Riboflavin (VITAMIN B2 PO) Take 100 mg by mouth 2 (two) times daily.     SUMAtriptan (IMITREX) 50 MG tablet Take 1 tablet (50 mg total) by mouth every 2 (two) hours as needed for migraine. May repeat in 2 hours if headache persists or recurs. 12 tablet 11   topiramate (TOPAMAX) 100 MG tablet Take 1 tablet (100 mg total) by mouth 2 (two) times daily. 180 tablet 3   No facility-administered medications prior to visit.    PAST MEDICAL HISTORY: Past Medical History:  Diagnosis Date    Asthma    Broken toe    Carpal tunnel syndrome    Iron deficiency anemia    Migraine    Near syncope Episodic near-syncope   Obesity, unspecified    Obesity   OCD (obsessive compulsive disorder)    Vertigo    Vitamin D deficiency     PAST SURGICAL HISTORY: Past Surgical History:  Procedure Laterality Date   LAPAROSCOPIC BILATERAL SALPINGECTOMY Bilateral 04/27/2021   Procedure: LAPAROSCOPIC BILATERAL SALPINGECTOMY;  Surgeon: Lazaro Arms, MD;  Location: [facility];  Service: Gynecology;  Laterality: Bilateral;   TONSILLECTOMY     WISDOM TOOTH EXTRACTION Bilateral     FAMILY HISTORY: Family History  Problem Relation Age of Onset   Deafness Mother    Diabetes Mother    Diabetes Maternal Grandmother    Brain cancer Maternal Grandmother    Diabetes Maternal Grandfather    Multiple sclerosis Other    Lupus Other     SOCIAL HISTORY: Social History   Socioeconomic History   Marital status: Married    Spouse name: Not on file   Number of children: 0   Years of education: some college   Highest education level: Not on file  Occupational History   Occupation: Unemployed  Tobacco Use   Smoking status: Never   Smokeless tobacco: Never  Vaping Use   Vaping status: Never Used  Substance and Sexual Activity   Alcohol use: No   Drug use: No   Sexual activity: Yes    Birth control/protection: Pill, Surgical    Comment: tubal  Other Topics Concern   Not on file  Social History Narrative   Lives at home with spouse.   Right-handed.    Occasional caffeine use.   Social Drivers of Corporate investment banker Strain: Low Risk  (02/24/2021)   Overall Financial Resource Strain (CARDIA)    Difficulty of Paying Living Expenses: Not hard at all  Food Insecurity: No Food Insecurity (02/24/2021)   Hunger Vital Sign    Worried About Running Out of Food in the Last Year: Never true    Ran Out of Food in the Last Year: Never true  Transportation Needs: No Transportation Needs  (02/24/2021)   PRAPARE - Administrator, Civil Service (Medical): No    Lack of Transportation (Non-Medical): No  Physical Activity: Insufficiently Active (02/24/2021)   Exercise Vital Sign  Days of Exercise per Week: 3 days    Minutes of Exercise per Session: 20 min  Stress: No Stress Concern Present (02/24/2021)   [employer] of Occupational Health - Occupational Stress Questionnaire    Feeling of Stress : Not at all  Social Connections: Moderately Isolated (02/24/2021)   Social Connection and Isolation Panel [NHANES]    Frequency of Communication with Friends and Family: More than three times a week    Frequency of Social Gatherings with Friends and Family: Twice a week    Attends Religious Services: Never    Database administrator or Organizations: No    Attends Banker Meetings: Never    Marital Status: Married  Catering manager Violence: Not At Risk (02/24/2021)   Humiliation, Afraid, Rape, and Kick questionnaire    Fear of Current or Ex-Partner: No    Emotionally Abused: No    Physically Abused: No    Sexually Abused: No  PHYSICAL EXAM  Vitals:   10/03/23 1258  BP: 126/72  Pulse: 73  Weight: (!) 305 lb (138.3 kg)  Height: 5\' 6"  (1.676 m)   Body mass index is 49.23 kg/m.  Generalized: Well developed, in no acute distress , obese  Neurological examination  Mentation: Alert oriented to time, place, history taking. Follows all commands speech and language fluent Cranial nerve II-XII: Pupils were equal round reactive to light. Extraocular movements were full, visual field were full on confrontational test. Facial sensation and strength were normal. Head turning and shoulder shrug were normal and symmetric. Motor: The motor testing reveals 5 over 5 strength of all 4 extremities. Good symmetric motor tone is noted throughout.  Sensory: Sensory testing is intact to soft touch on all 4 extremities. No evidence of extinction is noted.  Coordination:  Cerebellar testing reveals good finger-nose-finger and heel-to-shin bilaterally.  Gait and station: Has to push off to stand, when standing has sudden sway, moving her legs to wide based, exaggerated getting up and down. Uses cane   DIAGNOSTIC DATA (LABS, IMAGING, TESTING) - I reviewed patient records, labs, notes, testing and imaging myself where available.  Lab Results  Component Value Date   WBC 14.0 (H) 12/28/2021   HGB 13.7 12/28/2021   HCT 42.7 12/28/2021   MCV 87.5 12/28/2021   PLT 488 (H) 12/28/2021      Component Value Date/Time   NA 136 04/25/2021 0907   K 3.7 04/25/2021 0907   CL 106 04/25/2021 0907   CO2 21 (L) 04/25/2021 0907   GLUCOSE 75 04/25/2021 0907   BUN 14 04/25/2021 0907   CREATININE 0.66 04/25/2021 0907   CALCIUM 9.5 04/25/2021 0907   PROT 7.5 04/25/2021 0907   ALBUMIN 4.0 04/25/2021 0907   AST 22 04/25/2021 0907   ALT 23 04/25/2021 0907   ALKPHOS 62 04/25/2021 0907   BILITOT 0.6 04/25/2021 0907   GFRNONAA >60 04/25/2021 0907   GFRAA >60 10/27/2019 1035   No results found for: "CHOL", "HDL", "LDLCALC", "LDLDIRECT", "TRIG", "CHOLHDL" No results found for: "HGBA1C" Lab Results  Component Value Date   VITAMINB12 630 12/28/2021   Lab Results  Component Value Date   TSH 1.410 11/07/2019    ASSESSMENT AND PLAN 38 y.o. year old female  1.  Complicated migraine headache with dizziness 2.  Mood disorder  -Continues with symptoms and episodes, daily dizziness with migraine features since 2013, currently does not wish to try any other medications -Continue Topamax 100 mg twice daily -take Imitrex as needed for acute  migraine treatment -Tried and Failed: Effexor, wouldn't use BB due to asthma, Qulipta was not helpful, She wants to hold off of CGRP injection due to concern for side effect -Next steps: Amitriptyline, CGRP, Botox; we discussed referral to academic center she wishes to hold off for now due to family surgeries coming up  -Follow-up in 1 year  or sooner if needed  Margie Ege, AGNP-C, DNP 10/03/2023, 1:21 PM [facility] [address] [place], [place] [address] [phone]

## 2023-10-03 ENCOUNTER — Ambulatory Visit (INDEPENDENT_AMBULATORY_CARE_PROVIDER_SITE_OTHER): Payer: Medicare Other | Admitting: Neurology

## 2023-10-03 ENCOUNTER — Encounter: Payer: Self-pay | Admitting: Neurology

## 2023-10-03 VITALS — BP 126/72 | HR 73 | Ht 66.0 in | Wt 305.0 lb

## 2023-10-03 DIAGNOSIS — R42 Dizziness and giddiness: Secondary | ICD-10-CM

## 2023-10-03 DIAGNOSIS — G43109 Migraine with aura, not intractable, without status migrainosus: Secondary | ICD-10-CM

## 2023-10-03 MED ORDER — TOPIRAMATE 100 MG PO TABS: 100.0000 mg | ORAL_TABLET | Freq: Two times a day (BID) | ORAL | 3 refills | Status: AC

## 2023-10-03 NOTE — Patient Instructions (Signed)
 Great to see you today.  We will continue Topamax for migraine preventative.  We discussed considering referral to academic center for second opinion, other treatment options.  Please let me know if you are ready to proceed.  Will follow-up in 1 year.  Thanks

## 2023-10-11 DIAGNOSIS — M25511 Pain in right shoulder: Secondary | ICD-10-CM | POA: Diagnosis not present

## 2023-10-15 ENCOUNTER — Ambulatory Visit (INDEPENDENT_AMBULATORY_CARE_PROVIDER_SITE_OTHER): Admitting: Clinical

## 2023-10-15 DIAGNOSIS — F633 Trichotillomania: Secondary | ICD-10-CM

## 2023-10-15 DIAGNOSIS — F422 Mixed obsessional thoughts and acts: Secondary | ICD-10-CM | POA: Diagnosis not present

## 2023-10-15 NOTE — Progress Notes (Signed)
 [facility] Counselor/Therapist Progress Note  Patient ID: Shannon Brooks, MRN: [id number],    Date: 10/15/2023  Time Spent: 9:35am - 10:22am : 47 minutes   Treatment Type: Individual Therapy  Reported Symptoms: increased sleep  Mental Status Exam: Appearance:  Neat     Behavior: Appropriate  Motor: Normal  Speech/Language:  Clear and Coherent and Normal Rate  Affect: Appropriate  Mood: normal  Thought process: tangential  Thought content:   Tangential  Sensory/Perceptual disturbances:   WNL  Orientation: oriented to person, place, and situation  Attention: Fair  Concentration: Fair  Memory: WNL  Fund of knowledge:  Good  Insight:   Good  Judgment:  Good  Impulse Control: Good   Risk Assessment: Danger to Self:  No Patient denied current suicidal ideation  Self-injurious Behavior: No Danger to Others: No Patient denied current homicidal ideation Duty to Warn:no Physical Aggression / Violence:No  Access to Firearms a concern: No  Gang Involvement:No   Subjective: Patient reported she was diagnosed with bursitis in the shoulder and was given an injection. Patient reported she is currently receiving physical therapy. Patient stated, "I'm approaching this (physical therapy) with as open of a mind as I can with my past experiences with this shoulder". Patient stated, "I'm glad to not be in the have to have surgery boat". Patient reported patient's father had surgery last Friday and patient has been assisting patient's mother in providing 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 Patient stated, "Its been hard on the body" in response to caregiving. Patient stated, "its been fine" in response to patient's mood. Patient stated, "my anxiety's been fine".  Patient reported patient/husband traveled to [place] twice and patient reported she did not check the tire pressure until they drove through a construction area. Patient reported she checks the tire pressure on patient's vehicle one to  two times per week. Patient reported she has been checking the sink one to two times per week. Patient stated, "its kind of hard to develop any (negative thoughts)" and reported an increase in sleep (18 hours per day). Patient reported increased sleep due to medical conditions. Patient stated, "I am mildly anemic" and reported increased sleep when patient's iron and vitamin D levels are low.   Interventions: Cognitive Behavioral Therapy. Clinician conducted session via caregility video from clinician's home office. Patient provided verbal consent to proceed with telehealth session and is aware of limitations of telephone or video visits. Patient participated in session from patient's home. Reviewed events since last session and assessed for changes. Examined patient's thoughts/feelings related to patient's shoulder and treatment recommendations. Discussed patient's father's surgery and the impact of caregiving on patient. Assessed intensity and frequency of symptoms of anxiety, obsessive thoughts, and compulsions. Reviewed patient's thought record. Discussed increase in sleep and explored/identified triggers for recent increase. Clinician requested for homework patient continue thought record and identify evidence for and/or against thoughts documented and practice mindfulness, worry journal.   Collaboration of Care: Other not required at this time   Diagnosis:  Obsessive-compulsive disorder Trichotillomania   Plan: Patient is to use CBT and coping skills to help manage decrease in symptoms associated with their diagnosis.   Frequency: bi-weekly  Modality: individual    Long-term goal:   decrease obsessive-compulsive behaviors to once per week as evidence by decrease in checking sink, mailbox, water heater, AC unit to once per week for at least 3 consecutive months   Target Date: 11/09/23  Progress: progressing    decrease feelings of anger in response to  decreased mobility and other medical  conditions as evidenced by decrease in feelings of anger and irritability from 3-4 times per month to 1-2 times per month for 3 consecutive months   Target Date: 11/09/23  Progress: progressing    Short-term goal:  identify triggers and automatic thoughts associated with compulsive behaviors  Target Date: 11/09/23  Progress: progressing    identify coping strategies to utilize in response to decreased mobility and other medical conditions   Target Date: 11/09/23  Progress: progressing    Doree Barthel, LCSW

## 2023-10-15 NOTE — Progress Notes (Signed)
   Doree Barthel, LCSW

## 2023-10-16 DIAGNOSIS — G43109 Migraine with aura, not intractable, without status migrainosus: Secondary | ICD-10-CM | POA: Diagnosis not present

## 2023-10-16 DIAGNOSIS — Z0000 Encounter for general adult medical examination without abnormal findings: Secondary | ICD-10-CM | POA: Diagnosis not present

## 2023-10-16 DIAGNOSIS — J45909 Unspecified asthma, uncomplicated: Secondary | ICD-10-CM | POA: Diagnosis not present

## 2023-10-16 DIAGNOSIS — Z23 Encounter for immunization: Secondary | ICD-10-CM | POA: Diagnosis not present

## 2023-10-16 DIAGNOSIS — E782 Mixed hyperlipidemia: Secondary | ICD-10-CM | POA: Diagnosis not present

## 2023-10-16 DIAGNOSIS — Z0001 Encounter for general adult medical examination with abnormal findings: Secondary | ICD-10-CM | POA: Diagnosis not present

## 2023-10-22 ENCOUNTER — Ambulatory Visit (INDEPENDENT_AMBULATORY_CARE_PROVIDER_SITE_OTHER): Admitting: Clinical

## 2023-10-22 DIAGNOSIS — F429 Obsessive-compulsive disorder, unspecified: Secondary | ICD-10-CM

## 2023-10-22 DIAGNOSIS — F422 Mixed obsessional thoughts and acts: Secondary | ICD-10-CM

## 2023-10-22 DIAGNOSIS — F633 Trichotillomania: Secondary | ICD-10-CM | POA: Diagnosis not present
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
## 2023-10-22 NOTE — Progress Notes (Signed)
   Doree Barthel, LCSW

## 2023-10-24 DIAGNOSIS — M25511 Pain in right shoulder: Secondary | ICD-10-CM | POA: Diagnosis not present

## 2023-10-26 DIAGNOSIS — M25511 Pain in right shoulder: Secondary | ICD-10-CM | POA: Diagnosis not present

## 2023-10-30 DIAGNOSIS — M25511 Pain in right shoulder: Secondary | ICD-10-CM | POA: Diagnosis not present

## 2023-11-02 DIAGNOSIS — M25511 Pain in right shoulder: Secondary | ICD-10-CM | POA: Diagnosis not present

## 2023-11-06 DIAGNOSIS — M25511 Pain in right shoulder: Secondary | ICD-10-CM | POA: Diagnosis not present

## 2023-11-09 DIAGNOSIS — M25511 Pain in right shoulder: Secondary | ICD-10-CM | POA: Diagnosis not present

## 2023-11-12 ENCOUNTER — Ambulatory Visit (INDEPENDENT_AMBULATORY_CARE_PROVIDER_SITE_OTHER): Admitting: Clinical

## 2023-11-12 DIAGNOSIS — F422 Mixed obsessional thoughts and acts: Secondary | ICD-10-CM

## 2023-11-12 DIAGNOSIS — F633 Trichotillomania: Secondary | ICD-10-CM

## 2023-11-12 NOTE — Progress Notes (Signed)
   Doree Barthel, LCSW
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
## 2023-11-13 DIAGNOSIS — M25511 Pain in right shoulder: Secondary | ICD-10-CM | POA: Diagnosis not present

## 2023-11-20 DIAGNOSIS — M25511 Pain in right shoulder: Secondary | ICD-10-CM | POA: Diagnosis not present

## 2023-11-23 DIAGNOSIS — M25511 Pain in right shoulder: Secondary | ICD-10-CM | POA: Diagnosis not present

## 2023-11-27 DIAGNOSIS — M25511 Pain in right shoulder: Secondary | ICD-10-CM | POA: Diagnosis not present

## 2023-11-29 DIAGNOSIS — M25511 Pain in right shoulder: Secondary | ICD-10-CM | POA: Diagnosis not present

## 2023-12-05 DIAGNOSIS — M7541 Impingement syndrome of right shoulder: Secondary | ICD-10-CM | POA: Diagnosis not present

## 2023-12-14 ENCOUNTER — Ambulatory Visit (INDEPENDENT_AMBULATORY_CARE_PROVIDER_SITE_OTHER): Admitting: Clinical

## 2023-12-14 DIAGNOSIS — F422 Mixed obsessional thoughts and acts: Secondary | ICD-10-CM | POA: Diagnosis not present

## 2023-12-14 DIAGNOSIS — F633 Trichotillomania: Secondary | ICD-10-CM | POA: Diagnosis not present

## 2023-12-14 NOTE — Progress Notes (Unsigned)
   Shannon Barthel, LCSW
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
## 2023-12-27 DIAGNOSIS — M25511 Pain in right shoulder: Secondary | ICD-10-CM | POA: Diagnosis not present

## 2023-12-31 ENCOUNTER — Ambulatory Visit (INDEPENDENT_AMBULATORY_CARE_PROVIDER_SITE_OTHER): Admitting: Clinical

## 2023-12-31 ENCOUNTER — Encounter: Payer: Self-pay | Admitting: Clinical

## 2023-12-31 DIAGNOSIS — F633 Trichotillomania: Secondary | ICD-10-CM | POA: Diagnosis not present

## 2023-12-31 DIAGNOSIS — F422 Mixed obsessional thoughts and acts: Secondary | ICD-10-CM

## 2023-12-31 DIAGNOSIS — F429 Obsessive-compulsive disorder, unspecified: Secondary | ICD-10-CM

## 2023-12-31 NOTE — Progress Notes (Signed)
 [facility] Counselor/Therapist Progress Note  Patient ID: Shannon Brooks, MRN: [id number],    Date: 12/31/2023  Time Spent: 8:35am - 9:18am : 43 minutes  Treatment Type: Individual Therapy  Reported Symptoms: Patient reported recent an increase in sleep  Mental Status Exam: Appearance:  Neat and Well Groomed     Behavior: Appropriate  Motor: Normal  Speech/Language:  Clear and Coherent and Normal Rate  Affect: Appropriate  Mood: normal  Thought process: normal  Thought content:   WNL  Sensory/Perceptual disturbances:   WNL  Orientation: oriented to person, place, time/date, and situation  Attention: Fair  Concentration: Fair  Memory: WNL  Fund of knowledge:  Good  Insight:   Good  Judgment:  Good  Impulse Control: Good   Risk Assessment: Danger to Self:  No Patient denied current suicidal ideation  Self-injurious Behavior: No Danger to Others: No Patient denied current homicidal ideation Duty to Warn:no Physical Aggression / Violence:No  Access to Firearms a concern: No  Gang Involvement:No   Subjective: Patient reported an increase in sleep since last session and reported increase in sleep is due to change in the weather and barometric pressure. Patient stated, "it's been as good as anybody that's been sleeping as much as I have" in response to patient's mood since last session. Patient reported she is "most alert" between the hours of 10pm-2am.  Patient reported she frequently takes a nap around 2pm each day. Patient reported when patient sleeps lightly the changes in sleep are an indicator of improvement in patient's physical health. 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 Patient reported family history of ADHD and Anxiety. Patient stated, "yes" in response to recommendation to continue therapy.    Interventions: Cognitive  Behavioral Therapy and Motivational Interviewing. Clinician conducted session via caregility video from clinician's home office. Patient provided verbal consent to proceed with telehealth session and is aware of limitations of telephone or video visits. Patient participated in session from patient's home. Reviewed events since last session and assessed for changes. Discussed increase in patient's sleep, patient's sleep schedule, and the impact of patient's health on patient's sleep.  Clinician reviewed diagnoses and treatment recommendations. Provided psycho education related to diagnoses and treatment. Clinician utilized motivational interviewing to explore additional goals for therapy. Clinician utilized a task centered approach in collaboration with patient to update goals for therapy. Patient participated in update of goals and agreed to goals for therapy. Clinician requested for homework patient continue thought record and identify evidence for and/or against thoughts.    Collaboration of Care: Other not required at this time   Diagnosis:  Obsessive-compulsive disorder Trichotillomania   Plan: Patient is to use CBT and coping skills to help manage decrease in symptoms associated with their diagnosis.   Frequency: bi-weekly  Modality: individual    Long-term goal:   Maintain decrease in obsessive thoughts and compulsive behaviors to 1 to 0 times per week as evidence by decrease in checking sink, mailbox, water heater, AC unit to once per week for at least 3 consecutive months   Target Date: 11/08/24  Progress: progressing    decrease feelings of anger in response to decreased mobility and other medical conditions as evidenced by decrease in feelings of anger and irritability from 3-4 times per month to 1-2 times per month for 3 consecutive months   Target Date: 11/08/24  Progress: progressing    Short-term goal:   identify coping strategies to utilize in response to decreased mobility and  other medical conditions   Target Date: 11/08/24  Progress: progressing    Burlene Carpen, LCSW

## 2023-12-31 NOTE — Progress Notes (Signed)
   Doree Barthel, LCSW

## 2024-01-01 DIAGNOSIS — M25511 Pain in right shoulder: Secondary | ICD-10-CM | POA: Diagnosis not present

## 2024-01-03 DIAGNOSIS — M25511 Pain in right shoulder: Secondary | ICD-10-CM | POA: Diagnosis not present

## 2024-01-08 DIAGNOSIS — M25511 Pain in right shoulder: Secondary | ICD-10-CM | POA: Diagnosis not present

## 2024-01-14 ENCOUNTER — Ambulatory Visit (INDEPENDENT_AMBULATORY_CARE_PROVIDER_SITE_OTHER): Admitting: Clinical

## 2024-01-14 DIAGNOSIS — F633 Trichotillomania: Secondary | ICD-10-CM | POA: Diagnosis not present

## 2024-01-14 DIAGNOSIS — F429 Obsessive-compulsive disorder, unspecified: Secondary | ICD-10-CM | POA: Diagnosis not present

## 2024-01-14 DIAGNOSIS — F422 Mixed obsessional thoughts and acts: Secondary | ICD-10-CM

## 2024-01-14 NOTE — Progress Notes (Signed)
   Doree Barthel, LCSW
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
## 2024-01-15 DIAGNOSIS — M25511 Pain in right shoulder: Secondary | ICD-10-CM | POA: Diagnosis not present

## 2024-01-17 DIAGNOSIS — M25511 Pain in right shoulder: Secondary | ICD-10-CM | POA: Diagnosis not present

## 2024-01-22 DIAGNOSIS — M25511 Pain in right shoulder: Secondary | ICD-10-CM | POA: Diagnosis not present

## 2024-01-28 ENCOUNTER — Ambulatory Visit (INDEPENDENT_AMBULATORY_CARE_PROVIDER_SITE_OTHER): Admitting: Clinical

## 2024-01-28 DIAGNOSIS — F633 Trichotillomania: Secondary | ICD-10-CM

## 2024-01-28 DIAGNOSIS — F422 Mixed obsessional thoughts and acts: Secondary | ICD-10-CM | POA: Diagnosis not present

## 2024-01-28 NOTE — Progress Notes (Signed)
   Doree Barthel, LCSW

## 2024-01-28 NOTE — Progress Notes (Signed)
 [facility] Counselor/Therapist Progress Note  Patient ID: Shannon Brooks, MRN: [id number],    Date: 01/28/2024  Time Spent: 8:34am - 9:21am : 47 minutes   Treatment Type: Individual Therapy  Reported Symptoms: Patient reported recent decline in mood due to triggers  Mental Status Exam: Appearance:  Neat and Well Groomed     Behavior: Appropriate  Motor: Normal  Speech/Language:  Clear and Coherent and Normal Rate  Affect: Appropriate  Mood: normal  Thought process: normal  Thought content:   WNL  Sensory/Perceptual disturbances:   WNL  Orientation: oriented to person, place, time/date, and situation  Attention: Fair  Concentration: Fair  Memory: WNL  Fund of knowledge:  Good  Insight:   Good  Judgment:  Good  Impulse Control: Good   Risk Assessment: Danger to Self:  No Patient denied current suicidal ideation  Self-injurious Behavior: No Danger to Others: No Patient denied current homicidal ideation  Duty to Warn:no Physical Aggression / Violence:No  Access to Firearms a concern: No  Gang Involvement:No   Subjective: Patient reported patient has not had water since Friday. Patient reported an issue with the pressure tank that caused patient's well to dry out. Patient stated, we'll find out in response to the impact of recent plumbing event on patient's mood. Patient reported tossing and turning worrying about the water last night. Patient stated, its good in response to patient's current mood. Patient stated, there is a part of my brain that is bugging me to check the water heater in response to recent plumbing events. Patient stated, I would say this is a very significant trigger in reference to recent plumbing event. Patient stated, I haven't done any of my homework this week. Patient reportedIve just had a bad body week, heat, and plumbing issues were barriers to completing patient's homework. Patient stated, I was not in a good mood, I get  really irritated when my aphasia kicks up and I can't find the words to get out. Patient reported frustration and feelings of loss that patient can no longer participate in debates. Patient reported feelings of anger in reference to medical conditions. Patient reported patient is not able to participate in zumba or drink soda due to medical conditions.  Interventions: Cognitive Behavioral Therapy. Clinician conducted session via caregility video from clinician's home office. Patient provided verbal consent to proceed with telehealth session and is aware of limitations of telephone or video visits. Patient participated in session from patient's home. Reviewed events since last session and assessed for changes. Discussed recent triggers. Explored the impact of recent plumbing issues on obsessive thoughts and compulsions. Reviewed challenging negative thoughts/cognitive distortions. Explored and identified barriers to completing homework.  Explored the impact of patient's physical health on patient's mood. Explored and identified losses associated with medical conditions. Clinician requested for homework patient continue thought record and identify evidence for and/or against thoughts.    Collaboration of Care: Other not required at this time   Diagnosis:  Obsessive-compulsive disorder Trichotillomania   Plan: Patient is to use CBT and coping skills to help manage decrease in symptoms associated with their diagnosis.   Frequency: bi-weekly  Modality: individual    Long-term goal:   Maintain decrease in obsessive thoughts and compulsive behaviors to 1 to 0 times per week as evidence by decrease in checking sink, mailbox, water heater, AC unit to once per week for at least 3 consecutive months   Target Date: 11/08/24  Progress: progressing    decrease feelings of anger in  response to decreased mobility and other medical conditions as evidenced by decrease in feelings of anger and irritability from 3-4  times per month to 1-2 times per month for 3 consecutive months   Target Date: 11/08/24  Progress: progressing    Short-term goal:   identify coping strategies to utilize in response to decreased mobility and other medical conditions   Target Date: 11/08/24  Progress: progressing     [name], LCSW

## 2024-02-01 DIAGNOSIS — M25511 Pain in right shoulder: Secondary | ICD-10-CM | POA: Diagnosis not present

## 2024-02-11 ENCOUNTER — Ambulatory Visit (INDEPENDENT_AMBULATORY_CARE_PROVIDER_SITE_OTHER): Admitting: Clinical

## 2024-02-11 DIAGNOSIS — F633 Trichotillomania: Secondary | ICD-10-CM

## 2024-02-11 DIAGNOSIS — F429 Obsessive-compulsive disorder, unspecified: Secondary | ICD-10-CM | POA: Diagnosis not present

## 2024-02-11 DIAGNOSIS — F422 Mixed obsessional thoughts and acts: Secondary | ICD-10-CM

## 2024-02-11 NOTE — Progress Notes (Signed)
   Doree Barthel, LCSW

## 2024-02-11 NOTE — Progress Notes (Signed)
 [facility] Counselor/Therapist Progress Note  Patient ID: Shannon Brooks, MRN: [id number],    Date: 02/11/2024  Time Spent: 10:33am - 11:20am : 47 minutes   Treatment Type: Individual Therapy  Reported Symptoms: irritability, obsessions, compulsions  Mental Status Exam: Appearance:  Neat and Well Groomed     Behavior: Performing compulsions during session  Motor: Normal  Speech/Language:  Clear and Coherent and Normal Rate  Affect: Appropriate  Mood: irritable  Thought process: goal directed  Thought content:   Obsessions  Sensory/Perceptual disturbances:   WNL  Orientation: oriented to person, place, time/date, and situation  Attention: Poor  Concentration: Poor  Memory: WNL  Fund of knowledge:  Good  Insight:   Good  Judgment:  Good  Impulse Control: Good   Risk Assessment: Danger to Self:  No Patient denied current suicidal ideation  Self-injurious Behavior: No Danger to Others: No Patient denied current homicidal ideation Duty to Warn:no Physical Aggression / Violence:No  Access to Firearms a concern: No  Gang Involvement:No   Subjective: Patient reported patient has been evaluating a smell in patient's home and reported concern for potential electrical issues.  Patient reported a recent clog in the drain from patient's air conditioning unit and stated, there was a bit of a cascading problem going on with the [facility] unit. Patient reported patient was concerned for a potential leak in patient's toilet due to condensation. Patient stated, it turns out it was a whole lot of nothing and stated, its all been fixed in response to recent stressors. Patient stated, I have been obsessively watching the toilet ring all weekend and stated, every time I go in there to use the bathroom in reference to frequency of compulsion. Patient reported feeling patient has located the source of the smell in patient's home and feels the smell is due to patient's cat  vomiting. Patient stated, I will probably do a couple of test sniffs tonight. Patient stated, I'm trying my best in response to challenging obsessive thoughts. Patient stated, I'm kind of over it in response to household stressors. Patient stated, checking things in response to coping strategies. Patient reported the following thoughts associated with stressors:  is there a leak, do we have to fix yet another water problem, which is more money. Patient stated, a lot of this ties back to my disability because it should not be this big of a deal to handle in reference to household stressors and the impact on patient's physical health. Patient stated, the toilet thing has gone a bit into a compulsion. Patient stated, Its coming down in response to feelings of anxiety. During today's session patient was searching for the source of the smell in the home and was checking the [facility] unit and outlets/wiring in the home.    Interventions: Cognitive Behavioral Therapy and supportive therapy. Clinician conducted session via caregility video from clinician's home office. Patient provided verbal consent to proceed with telehealth session and is aware of limitations of telephone or video visits. Patient participated in session from patient's home. Reviewed events since last session and assessed for changes. Provided supportive therapy and active listening as patient discussed recent stressors and patient's response. Explored the impact of recent stressors on patient's thoughts, mood, and behaviors. Explored and identified obsessive thoughts triggered by recent stressors. Reviewed challenging obsessive thoughts/distortions and provided psycho education related to use of decatastrophizing questions. Provided psycho education related to cognitive distortions, such as, over generalization. Explored coping strategies patient utilized in response to stressors. Clinician requested for  homework patient continue thought  record and practice challenging obsessive thoughts/cognitive distortions.    Collaboration of Care: Other not required at this time   Diagnosis:  Obsessive-compulsive disorder Trichotillomania   Plan: Patient is to use CBT and coping skills to help manage decrease in symptoms associated with their diagnosis.   Frequency: bi-weekly  Modality: individual    Long-term goal:   Maintain decrease in obsessive thoughts and compulsive behaviors to 1 to 0 times per week as evidence by decrease in checking sink, mailbox, water heater, AC unit to once per week for at least 3 consecutive months   Target Date: 11/08/24  Progress: progressing    decrease feelings of anger in response to decreased mobility and other medical conditions as evidenced by decrease in feelings of anger and irritability from 3-4 times per month to 1-2 times per month for 3 consecutive months   Target Date: 11/08/24  Progress: progressing    Short-term goal:   identify coping strategies to utilize in response to decreased mobility and other medical conditions   Target Date: 11/08/24  Progress: progressing    [name], LCSW

## 2024-02-25 ENCOUNTER — Ambulatory Visit (INDEPENDENT_AMBULATORY_CARE_PROVIDER_SITE_OTHER): Admitting: Clinical

## 2024-02-25 DIAGNOSIS — F429 Obsessive-compulsive disorder, unspecified: Secondary | ICD-10-CM

## 2024-02-25 DIAGNOSIS — F633 Trichotillomania: Secondary | ICD-10-CM

## 2024-02-25 DIAGNOSIS — F422 Mixed obsessional thoughts and acts: Secondary | ICD-10-CM

## 2024-02-25 NOTE — Progress Notes (Signed)
 [facility] Counselor/Therapist Progress Note  Patient ID: Shannon Brooks, MRN: [id number],    Date: 02/25/2024  Time Spent: 9:37am - 10:29am : 52 minutes  Treatment Type: Individual Therapy  Reported Symptoms: Patient reported a recent decrease in obsessive thoughts and compulsions  Mental Status Exam: Appearance:  Neat and Well Groomed     Behavior: Appropriate  Motor: Normal  Speech/Language:  Clear and Coherent and Normal Rate  Affect: Appropriate  Mood: normal  Thought process: normal  Thought content:   WNL  Sensory/Perceptual disturbances:   WNL  Orientation: oriented to person, place, time/date, and situation  Attention: Fair  Concentration: Fair  Memory: WNL  Fund of knowledge:  Good  Insight:   Good  Judgment:  Good  Impulse Control: Good   Risk Assessment: Danger to Self:  No Patient denied current suicidal ideation  Self-injurious Behavior: No Danger to Others: No Patient denied current homicidal ideation Duty to Warn:no Physical Aggression / Violence:No  Access to Firearms a concern: No  Gang Involvement:No   Subjective: Patient stated, I got my wish I've had a full two weeks with nothing breaking in response to events since last session. Patient reported patient stopped checking for a smell in the house and stated, I've almost stopped checking the toilet. Patient stated, pretty good in response to patient's mood since last session. Patient stated, from anger to grief in reference to feelings associated with medical conditions. Patient stated, I should be able to do this, I use to be able to do this, I use to be abel to do this after a long days work, normal people can do this, I should not be tired after 30 minutes of shopping in response to thoughts triggered by medical conditions. Patients stated, It quite literally hit out of the blue one day in reference to onset of vestibular migraines. Patient reported patient initially thought  symptoms were related to asthma. 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 Patient stated, it (migraine) puts me down in reference to mood associated with medical conditions. Patient reported patient would like to be able to work and stated, I would like to properly take care of my home. Patient stated, I've lost a lot to this in reference to medical conditions. Patient stated, I feel like I've gained an interesting online community and stated, I've had to cultivate a support network online.    Interventions: Cognitive Behavioral Therapy. Clinician conducted session via caregility video from clinician's home office. Patient provided verbal consent to proceed with telehealth session and is aware of limitations of telephone or video visits. Patient participated in session from patient's home. Reviewed events since last session and assessed for changes. Reviewed status of recent stressors and the impact on frequency of obsessive thoughts and compulsions. Explored and identified thoughts and feelings triggered by medical conditions. Discussed onset and progression of medical conditions.  Explored impact of medical conditions on patient's daily life/activities. Discussed activities patient would like to perform/participate in. Explored losses and gains in response to medical conditions. Clinician requested for homework patient continue thought record and practice challenging obsessive thoughts/cognitive distortions.    Collaboration of Care: Other not required at this time   Diagnosis:  Obsessive-compulsive disorder Trichotillomania   Plan: Patient is to use CBT and coping skills to help manage decrease in symptoms associated with their diagnosis.   Frequency: bi-weekly  Modality: individual    Long-term goal:  Maintain decrease in obsessive thoughts and compulsive behaviors to 1 to 0 times per week as  evidence by decrease in checking sink, mailbox, water heater, AC unit to once per week for at least 3 consecutive months   Target Date: 11/08/24  Progress: progressing    decrease feelings of anger in response to decreased mobility and other medical conditions as evidenced by decrease in feelings of anger and irritability from 3-4 times per month to 1-2 times per month for 3 consecutive months   Target Date: 11/08/24  Progress: progressing    Short-term goal:   identify coping strategies to utilize in response to decreased mobility and other medical conditions   Target Date: 11/08/24  Progress: progressing       [name], LCSW

## 2024-02-25 NOTE — Progress Notes (Signed)
   Shannon Barthel, LCSW

## 2024-03-10 ENCOUNTER — Ambulatory Visit (INDEPENDENT_AMBULATORY_CARE_PROVIDER_SITE_OTHER): Admitting: Clinical

## 2024-03-10 DIAGNOSIS — F429 Obsessive-compulsive disorder, unspecified: Secondary | ICD-10-CM | POA: Diagnosis not present

## 2024-03-10 DIAGNOSIS — F422 Mixed obsessional thoughts and acts: Secondary | ICD-10-CM

## 2024-03-10 DIAGNOSIS — F633 Trichotillomania: Secondary | ICD-10-CM | POA: Diagnosis not present

## 2024-03-10 NOTE — Progress Notes (Signed)
 [facility] Counselor/Therapist Progress Note  Patient ID: Shannon Brooks, MRN: [id number],    Date: 03/10/2024  Time Spent: 9:34am - 10:23am : 49 minutes  Treatment Type: Individual Therapy  Reported Symptoms: patient reported recently pulling patient's hair  Mental Status Exam: Appearance:  Neat and Well Groomed     Behavior: Appropriate  Motor: Normal  Speech/Language:  Clear and Coherent and Normal Rate  Affect: Appropriate  Mood: normal  Thought process: normal  Thought content:   WNL  Sensory/Perceptual disturbances:   WNL  Orientation: oriented to person, place, time/date, and situation  Attention: Good  Concentration: Good  Memory: WNL  Fund of knowledge:  Good  Insight:   Good  Judgment:  Good  Impulse Control: Good   Risk Assessment: Danger to Self:  No Patient denied current suicidal ideation  Self-injurious Behavior: No Danger to Others: No Patient denied current homicidal ideation Duty to Warn:no Physical Aggression / Violence:No  Access to Firearms a concern: No  Gang Involvement:No   Subjective: Patient stated, I may be a little weepy this session but they're happy tears. Patient stated, I am using a laptop for the first time in almost 10 years. Patient reported patient researched laptop screens for years and reported recently finding a screen that meets my window of functionality. Patient stated, I can look at a screen without getting massive eye strain. Patient stated, I cried in response to finding a laptop that meets patient's visual needs. Patient stated, It was a little piece of normal back in response to being able to use a laptop. Patient stated, Its amazing, its a little piece of normal, its not visibly centered around my disability. Patient stated, It is a very welcomed addition to my set up. Patient stated, it marginally makes my life a sliver easier, It makes my typing easier. Patient stated, that sliver is  invaluable to me. Patient stated, writing is a hobby of mine and to have that back, I can not put that into words in reference to being able to write on laptop. Patient stated, thats the big thing, that tiny sliver of normal, it is liberating, It feels like I've got a little bit of my life. Patient stated, Its pretty good in response to patient's current mood.   Interventions: Cognitive Behavioral Therapy. Clinician conducted session via caregility video from clinician's home office. Patient provided verbal consent to proceed with telehealth session and is aware of limitations of telephone or video visits. Patient participated in session from patient's home. Reviewed events since last session and assessed for changes. Processed patient's thoughts/feelings related to recent laptop purchase. Explored the meaning of the laptop to patient and the impact on patient's quality of life. Clinician requested for homework patient continue thought record and practice challenging obsessive thoughts/cognitive distortions.    Collaboration of Care: Other not required at this time   Diagnosis:  Obsessive-compulsive disorder Trichotillomania   Plan: Patient is to use CBT and coping skills to help manage decrease in symptoms associated with their diagnosis.   Frequency: bi-weekly  Modality: individual    Long-term goal:   Maintain decrease in obsessive thoughts and compulsive behaviors to 1 to 0 times per week as evidence by decrease in checking sink, mailbox, water heater, AC unit to once per week for at least 3 consecutive months   Target Date: 11/08/24  Progress: progressing    decrease feelings of anger in response to decreased mobility and other medical conditions as evidenced by decrease in feelings of  anger and irritability from 3-4 times per month to 1-2 times per month for 3 consecutive months   Target Date: 11/08/24  Progress: progressing    Short-term goal:   identify coping strategies to  utilize in response to decreased mobility and other medical conditions   Target Date: 11/08/24  Progress: progressing    [name], LCSW

## 2024-03-10 NOTE — Progress Notes (Signed)
   [name], LCSW

## 2024-03-31 ENCOUNTER — Ambulatory Visit (INDEPENDENT_AMBULATORY_CARE_PROVIDER_SITE_OTHER): Admitting: Clinical

## 2024-03-31 DIAGNOSIS — F633 Trichotillomania: Secondary | ICD-10-CM | POA: Diagnosis not present

## 2024-03-31 DIAGNOSIS — F422 Mixed obsessional thoughts and acts: Secondary | ICD-10-CM

## 2024-03-31 DIAGNOSIS — F429 Obsessive-compulsive disorder, unspecified: Secondary | ICD-10-CM

## 2024-03-31 NOTE — Progress Notes (Signed)
   [name], LCSW

## 2024-03-31 NOTE — Progress Notes (Signed)
 [facility] Counselor/Therapist Progress Note  Patient ID: Shannon Brooks, MRN: [id number],    Date: 03/31/2024  Time Spent: 9:34am - 10:25am : 51 minutes   Treatment Type: Individual Therapy  Reported Symptoms: drowsy, increased sleep, aphasia  Mental Status Exam: Appearance:  Neat and Well Groomed     Behavior: Drowsy  Motor: Normal  Speech/Language:  Clear and Coherent and Normal Rate  Affect: Appropriate  Mood: normal  Thought process: normal  Thought content:   WNL  Sensory/Perceptual disturbances:   WNL  Orientation: oriented to person, place, time/date, and situation  Attention: Good  Concentration: Good  Memory: WNL  Fund of knowledge:  Good  Insight:   Good  Judgment:  Good  Impulse Control: Good   Risk Assessment: Danger to Self:  No Patient denied current suicidal ideation  Self-injurious Behavior: No Danger to Others: No Patient denied current homicidal ideation Duty to Warn:no Physical Aggression / Violence:No  Access to Firearms a concern: No  Gang Involvement:No   Subjective: Patient stated, they've been going pretty decent, except for the past few days, I've been sleeping 16-18 hours per day in response to events since last session. Patient reported the weather, severe fatigue, congestion, menstrual cycle, anemia associated with menstrual cycle are triggers for increased sleep. Patient reported patient has been falling asleep during activities. Patient stated, I pick up everything I come in contact with in reference to illness. Patient reported patient has reached out to patient's physician. Patient reported patient has discussed concerns with patient's PCP regarding lupus diagnosis and has requested lab work to determine diagnosis. Patient stated, kind of too tired to really do much of anything. Patient stated, when I don't even have the energy to play a video game and reported watching television are indicators patient is not  feeling well. Patient reported using caffeine to increase energy. Patient reported an increase in fatigue consistent with migraine triggers. Patient stated, the aphasia has been bad too. Patient stated, this is the kind of week I basically have the energy to take care of the animals and that's it. Patient stated, its been floated in reference to sleep study and reported patient's PCP has recommended a specialist at [facility]. Patient stated, I need a break in reference to testing.  Patient stated, this feels like sick fatigue.   Interventions: Cognitive Behavioral Therapy. Clinician conducted session via caregility video and via telephone audio from clinician's home office due to caregility audio not working. Patient provided verbal consent to proceed with telehealth session and is aware of limitations of telephone or video visits. Patient participated in session from patient's home. Reviewed events since last session and assessed for changes. Discussed increase sleep and fatigue. Explored and identified triggers for increased sleep and fatigue. Discussed patient following up with PCP to discuss sleep and fatigue. Explored coping strategies patient has utilized in response to fatigue.  Explored impact of fatigue on patient's daily activities. Provided psycho education related to sleep and sleep hygiene. Discussed patient's thoughts/feelings related to medical testing/evaluations. Provided psycho education related to cost/benefit analysis as it relates to medical testing/evaluations.    Collaboration of Care: Other not required at this time   Diagnosis:  Obsessive-compulsive disorder Trichotillomania   Plan: Patient is to use CBT and coping skills to help manage decrease in symptoms associated with their diagnosis.   Frequency: bi-weekly  Modality: individual    Long-term goal:   Maintain decrease in obsessive thoughts and compulsive behaviors to 1 to 0 times per week as  evidence by decrease in  checking sink, mailbox, water heater, AC unit to once per week for at least 3 consecutive months   Target Date: 11/08/24  Progress: progressing    decrease feelings of anger in response to decreased mobility and other medical conditions as evidenced by decrease in feelings of anger and irritability from 3-4 times per month to 1-2 times per month for 3 consecutive months   Target Date: 11/08/24  Progress: progressing    Short-term goal:   identify coping strategies to utilize in response to decreased mobility and other medical conditions   Target Date: 11/08/24  Progress: progressing    [name], LCSW

## 2024-04-11 DIAGNOSIS — E559 Vitamin D deficiency, unspecified: Secondary | ICD-10-CM | POA: Diagnosis not present

## 2024-04-11 DIAGNOSIS — E782 Mixed hyperlipidemia: Secondary | ICD-10-CM | POA: Diagnosis not present

## 2024-04-11 DIAGNOSIS — R768 Other specified abnormal immunological findings in serum: Secondary | ICD-10-CM | POA: Diagnosis not present

## 2024-04-14 ENCOUNTER — Ambulatory Visit (INDEPENDENT_AMBULATORY_CARE_PROVIDER_SITE_OTHER): Admitting: Clinical

## 2024-04-14 DIAGNOSIS — F633 Trichotillomania: Secondary | ICD-10-CM | POA: Diagnosis not present

## 2024-04-14 DIAGNOSIS — F422 Mixed obsessional thoughts and acts: Secondary | ICD-10-CM

## 2024-04-14 DIAGNOSIS — F429 Obsessive-compulsive disorder, unspecified: Secondary | ICD-10-CM | POA: Diagnosis not present

## 2024-04-14 NOTE — Progress Notes (Signed)
   [name], LCSW
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
## 2024-04-17 DIAGNOSIS — G43109 Migraine with aura, not intractable, without status migrainosus: Secondary | ICD-10-CM | POA: Diagnosis not present

## 2024-04-17 DIAGNOSIS — Z862 Personal history of diseases of the blood and blood-forming organs and certain disorders involving the immune mechanism: Secondary | ICD-10-CM | POA: Diagnosis not present

## 2024-04-17 DIAGNOSIS — Z23 Encounter for immunization: Secondary | ICD-10-CM | POA: Diagnosis not present

## 2024-04-17 DIAGNOSIS — D509 Iron deficiency anemia, unspecified: Secondary | ICD-10-CM | POA: Diagnosis not present

## 2024-04-17 DIAGNOSIS — E559 Vitamin D deficiency, unspecified: Secondary | ICD-10-CM | POA: Diagnosis not present

## 2024-04-17 DIAGNOSIS — E782 Mixed hyperlipidemia: Secondary | ICD-10-CM | POA: Diagnosis not present

## 2024-04-19 ENCOUNTER — Encounter: Payer: Self-pay | Admitting: Neurology

## 2024-04-22 ENCOUNTER — Encounter (INDEPENDENT_AMBULATORY_CARE_PROVIDER_SITE_OTHER): Payer: Self-pay | Admitting: *Deleted

## 2024-04-23 ENCOUNTER — Encounter (INDEPENDENT_AMBULATORY_CARE_PROVIDER_SITE_OTHER): Payer: Self-pay | Admitting: Gastroenterology

## 2024-04-23 ENCOUNTER — Ambulatory Visit (INDEPENDENT_AMBULATORY_CARE_PROVIDER_SITE_OTHER): Admitting: Gastroenterology

## 2024-04-23 VITALS — BP 125/80 | HR 74 | Temp 97.2°F | Ht 65.0 in | Wt 308.5 lb

## 2024-04-23 DIAGNOSIS — Z6841 Body Mass Index (BMI) 40.0 and over, adult: Secondary | ICD-10-CM | POA: Diagnosis not present

## 2024-04-23 DIAGNOSIS — R110 Nausea: Secondary | ICD-10-CM | POA: Diagnosis not present

## 2024-04-23 DIAGNOSIS — R109 Unspecified abdominal pain: Secondary | ICD-10-CM

## 2024-04-23 DIAGNOSIS — R1013 Epigastric pain: Secondary | ICD-10-CM | POA: Insufficient documentation

## 2024-04-23 MED ORDER — PANTOPRAZOLE SODIUM 40 MG PO TBEC: 40.0000 mg | DELAYED_RELEASE_TABLET | Freq: Every day | ORAL | 3 refills | Status: AC

## 2024-04-23 NOTE — Progress Notes (Signed)
 Shannon Brooks , M.D. Gastroenterology & Hepatology [facility] Gastroenterology [address] [place], [place] [address] Primary Care Physician: Joeann Browning, FNP [address] [place] [address]  Chief Complaint: Nausea abdominal pain  History of Present Illness: Shannon Brooks is a 38 y.o. female with class III obesity, chronic pain, migraines, OCD who presents for evaluation of nausea and abdominal pain  Patient is seen in the clinic while she continues ginger tablets every 4 hours with her nausea.  Patient denies any complaints except  low-level nausea .  Patient was referred for abdominal pain on questioning denies any abdominal pain.  Patient takes ibuprofen almost on a consistent basis alternating with Tylenol .  She takes Tums on a consistent basis as well.The patient denies having any fever, chills, hematochezia, melena, hematemesis,  diarrhea, jaundice, pruritus or weight loss.  Labs from 03/2024 hemoglobin 14.8 platelet 448 normal liver enzymes  Last ZHI:wnwz Last Colonoscopy:none  FHx: neg for any gastrointestinal/liver disease, no malignancies Social: neg smoking, alcohol or illicit drug use   Past Medical History: Past Medical History:  Diagnosis Date   Asthma    Broken toe    Carpal tunnel syndrome    Iron deficiency anemia    Migraine    Near syncope Episodic near-syncope   Obesity, unspecified    Obesity   OCD (obsessive compulsive disorder)    Vertigo    Vitamin D  deficiency     Past Surgical History: Past Surgical History:  Procedure Laterality Date   LAPAROSCOPIC BILATERAL SALPINGECTOMY Bilateral 04/27/2021   Procedure: LAPAROSCOPIC BILATERAL SALPINGECTOMY;  Surgeon: Jayne Vonn DEL, MD;  Location: [facility];  Service: Gynecology;  Laterality: Bilateral;   TONSILLECTOMY     WISDOM TOOTH EXTRACTION Bilateral     Family History: Family History  Problem Relation Age of Onset   Deafness Mother    Diabetes  Mother    Diabetes Maternal Grandmother    Brain cancer Maternal Grandmother    Diabetes Maternal Grandfather    Multiple sclerosis Other    Lupus Other     Social History: Social History   Tobacco Use  Smoking Status Never  Smokeless Tobacco Never   Social History   Substance and Sexual Activity  Alcohol Use No   Social History   Substance and Sexual Activity  Drug Use No    Allergies: Allergies  Allergen Reactions   Penicillins     Was told she was allergic but has taken, amoxicillin with no reaction   Serotonin Reuptake Inhibitors (Ssris)     Adverse Reaction   Desipramine Rash    Medications: Current Outpatient Medications  Medication Sig Dispense Refill   acetaminophen  (TYLENOL ) 325 MG tablet Take 650 mg by mouth every 6 (six) hours as needed for moderate pain.     albuterol  (PROVENTIL  HFA;VENTOLIN  HFA) 108 (90 BASE) MCG/ACT inhaler Inhale 2 puffs into the lungs every 6 (six) hours as needed for wheezing or shortness of breath.      atorvastatin (LIPITOR) 20 MG tablet Take 20 mg by mouth daily.     cetirizine (ZYRTEC) 10 MG tablet Chew 10 mg by mouth daily.     Cholecalciferol (VITAMIN D ) 50 MCG (2000 UT) tablet Take 2,000 Units by mouth daily.     ergocalciferol  (VITAMIN D2) 1.25 MG (50000 UT) capsule Take 50,000 Units by mouth once a week.     ferrous sulfate 325 (65 FE) MG tablet Take 325 mg by mouth daily with breakfast.  guaifenesin (HUMIBID E) 400 MG TABS tablet Take 400 mg by mouth in the morning and at bedtime.     hyoscyamine (LEVSIN) 0.125 MG tablet Take 0.125 mg by mouth every 6 (six) hours as needed (diarrhea).     ibuprofen (ADVIL,MOTRIN) 800 MG tablet Take 800 mg by mouth at bedtime. (Patient taking differently: Take 800 mg by mouth as needed.)     magnesium oxide (MAG-OX) 400 MG tablet Take 400 mg by mouth 2 (two) times daily.     Menthol, Topical Analgesic, (BIOFREEZE EX) Apply 1 application topically daily as needed (pain).     norethindrone   (MICRONOR ) 0.35 MG tablet Take 1 tablet by mouth once daily 56 tablet 5   pantoprazole (PROTONIX) 40 MG tablet Take 1 tablet (40 mg total) by mouth daily. 60 tablet 3   Phenylephrine HCl 5 MG TABS Take 5 mg by mouth in the morning and at bedtime.     Potassium 99 MG TABS Take 99 mg by mouth daily.     Riboflavin (VITAMIN B2 PO) Take 100 mg by mouth 2 (two) times daily.     SUMAtriptan  (IMITREX ) 50 MG tablet Take 1 tablet (50 mg total) by mouth every 2 (two) hours as needed for migraine. May repeat in 2 hours if headache persists or recurs. 12 tablet 11   topiramate  (TOPAMAX ) 100 MG tablet Take 1 tablet (100 mg total) by mouth 2 (two) times daily. 180 tablet 3   No current facility-administered medications for this visit.    Review of Systems: GENERAL: negative for malaise, night sweats HEENT: No changes in hearing or vision, no nose bleeds or other nasal problems. NECK: Negative for lumps, goiter, pain and significant neck swelling RESPIRATORY: Negative for cough, wheezing CARDIOVASCULAR: Negative for chest pain, leg swelling, palpitations, orthopnea GI: SEE HPI MUSCULOSKELETAL: Negative for joint pain or swelling, back pain, and muscle pain. SKIN: Negative for lesions, rash HEMATOLOGY Negative for prolonged bleeding, bruising easily, and swollen nodes. ENDOCRINE: Negative for cold or heat intolerance, polyuria, polydipsia and goiter. NEURO: negative for tremor, gait imbalance, syncope and seizures. The remainder of the review of systems is noncontributory.   Physical Exam: BP 125/80   Pulse 74   Temp (!) 97.2 F (36.2 C)   Ht 5' 5 (1.651 m)   Wt (!) 308 lb 8 oz (139.9 kg)   LMP 04/02/2024 (Approximate)   BMI 51.34 kg/m  GENERAL: The patient is AO x3, in no acute distress. HEENT: Head is normocephalic and atraumatic. EOMI are intact. Mouth is well hydrated and without lesions. NECK: Supple. No masses LUNGS: Clear to auscultation. No presence of rhonchi/wheezing/rales. Adequate  chest expansion HEART: RRR, normal s1 and s2. ABDOMEN: Soft, nontender, no guarding, no peritoneal signs, and nondistended. BS +. No masses.    Imaging/Labs: as above     Latest Ref Rng & Units 12/28/2021    9:58 AM 04/25/2021    9:07 AM 11/25/2020    2:15 PM  CBC  WBC 4.0 - 10.5 K/uL 14.0  12.2  10.4   Hemoglobin 12.0 - 15.0 g/dL 86.2  86.0  86.1   Hematocrit 36.0 - 46.0 % 42.7  44.3  43.7   Platelets 150 - 400 K/uL 488  487  460    Lab Results  Component Value Date   IRON 64 12/28/2021   TIBC 322 12/28/2021   FERRITIN 97 12/28/2021    I personally reviewed and interpreted the available labs, imaging and endoscopic files.  Impression and Plan:  Verlisa Troyer is a 38 y.o. female with class III obesity, chronic pain, m GERD igraines, OCD who presents for evaluation of nausea and abdominal pain  #Abdominal pain  #Nausea  Patient reports persistent nausea and was referred here for abdominal pain.  She has significant exposure to ibuprofen (NSAID) and this could be peptic ulcer disease  Labs without anemia and normal liver enzymes.  No alarm symptoms such as hematochezia, unintentional weight loss, dysphagia and labs without anemia  We will start with medical management for possible peptic ulcer disease with PPI  Start Protonix 40 mg 30 minutes before breakfast  Avoid using high dose aspirin including Goody/BC powders, NSAIDs such as Aleve, ibuprofen, naproxen, Motrin, Voltaren or Advil (even the topical ones)  Will send IBS panel with fecal calprotectin, alpha gal, celiac and CRP  Patient continues to be symptomatic despite PPI , will consider upper endoscopy at that time  We will follow the patient closely in the clinic  #BMI 51      - walking at a brisk pace/biking at moderate intesity 2.5-5 hours per week     - use pedometer/step counter to track activity     - goal to lose 5-10% of initial body weight     - avoid suagry drinks and juices, use zero calorie  beverages     - increase water intake     - eat a low carb diet with plenty of veggies and fruit     - Get sufficient sleep 7-8 hrs nightly     - maitain active lifestyle     - avoid alcohol     - recommend 2-3 cups Coffee daily     - Counsel on lowering cholesterol by having a diet rich in vegetables,          protein (avoid red meats) and good fats(fish, salmon).    All questions were answered.      Lucy Boardman Faizan Alexcis Bicking, MD Gastroenterology and Hepatology [facility] Gastroenterology   This chart has been completed using [facility] Dictation software, and while attempts have been made to ensure accuracy , certain words and phrases may not be transcribed as intended

## 2024-04-23 NOTE — Patient Instructions (Signed)
 It was very nice to meet you today, as dicussed with will plan for the following :  1) labs and stool sample  2) protonix 40mg , 30 min before first meal

## 2024-04-24 ENCOUNTER — Other Ambulatory Visit (HOSPITAL_COMMUNITY)
Admission: RE | Admit: 2024-04-24 | Discharge: 2024-04-24 | Disposition: A | Discharge status: Home / Self Care | Source: Ambulatory Visit | Attending: Gastroenterology | Admitting: Gastroenterology

## 2024-04-24 DIAGNOSIS — R110 Nausea: Secondary | ICD-10-CM | POA: Diagnosis present

## 2024-04-24 DIAGNOSIS — R1013 Epigastric pain: Secondary | ICD-10-CM | POA: Insufficient documentation

## 2024-04-24 LAB — C-REACTIVE PROTEIN: CRP: 1.9 mg/dL — ABNORMAL HIGH (ref <1.0)

## 2024-04-24 LAB — FERRITIN: Ferritin: 139 ng/mL (ref 11–307)

## 2024-04-25 LAB — IGA: IgA: 186 mg/dL (ref 87–352)

## 2024-04-25 LAB — GLIADIN ANTIBODIES, SERUM
Antigliadin Abs, IgA: 2 U (ref 0–19)
Gliadin IgG: 2 U (ref 0–19)

## 2024-04-26 LAB — MISC LABCORP TEST (SEND OUT): Labcorp test code: 650003

## 2024-04-26 LAB — TISSUE TRANSGLUTAMINASE, IGA: Tissue Transglutaminase Ab, IgA: <2 U/mL (ref 0–3)

## 2024-04-27 LAB — CALPROTECTIN, FECAL: Calprotectin, Fecal: 21 ug/g (ref 0–120)

## 2024-04-28 ENCOUNTER — Ambulatory Visit (INDEPENDENT_AMBULATORY_CARE_PROVIDER_SITE_OTHER): Admitting: Clinical

## 2024-04-28 DIAGNOSIS — F633 Trichotillomania: Secondary | ICD-10-CM

## 2024-04-28 DIAGNOSIS — F429 Obsessive-compulsive disorder, unspecified: Secondary | ICD-10-CM | POA: Diagnosis not present

## 2024-04-28 DIAGNOSIS — F422 Mixed obsessional thoughts and acts: Secondary | ICD-10-CM

## 2024-04-28 NOTE — Progress Notes (Signed)
   [name], LCSW

## 2024-04-28 NOTE — Progress Notes (Signed)
 [facility] Counselor/Therapist Progress Note  Patient ID: Shannon Brooks, MRN: [id number],    Date: 04/28/2024  Time Spent: 9:35am - 10:22am : 47 minutes  Treatment Type: Individual Therapy  Reported Symptoms: fatigue, decreased sleep  Mental Status Exam: Appearance:  Neat and Well Groomed     Behavior: Appropriate  Motor: Normal  Speech/Language:  Clear and Coherent and Normal Rate  Affect: Appropriate  Mood: normal  Thought process: normal  Thought content:   WNL  Sensory/Perceptual disturbances:   WNL  Orientation: oriented to person, place, time/date, and situation  Attention: Fair  Concentration: Fair  Memory: WNL  Fund of knowledge:  Good  Insight:   Good  Judgment:  Good  Impulse Control: Good   Risk Assessment: Danger to Self:  No Patient denied current suicidal ideation  Self-injurious Behavior: No Danger to Others: No Patient denied current homicidal ideation Duty to Warn:no Physical Aggression / Violence:No  Access to Firearms a concern: No  Gang Involvement:No   Subjective: Patient stated, my ADHD has screwed me up for the whole week and reported patient accidentally sent patient's allergy medicine with husband. Patient reported patient's PCP referred patient to a GI specialist and patient reported concern for alpha gal syndrome due to patient's test results. Patient reported patient discovered a paternal relative has a diagnosis of lupus and reported concern about patient obtaining a diagnosis of lupus due to test results. Patient reported PCP is referring patient to a rheumatologist. Patient stated, Jesus actually been doing pretty good I think. Patient stated, I'm low energy because I didn't sleep well last night. Patient reported feeling pressured to sleep due to activities on Mondays. Patient reported difficulty falling asleep last night and concern about lack of energy when sleep is decreased. Patient reported using guided meditation  nightly. Patient stated, I get hung up on the math in reference to grounding exercise of counting and reported grounding exercises trigger obsessive thoughts and anxiety. Patient stated, I forgot to do the thought record. Patient stated, I had very short spurts of energy followed by being comatose in reference to barriers to completing thought record. Patient reported medical appointments, assisting mother with workshop are additional barriers to completing thought record. Patient stated, there's definitely some resentment to my body there, and some resignation in reference to patient's decision to refrain from attending upcoming event. Patient reported thought, you (patient) should be able to do this its not that big of a deal in response to decision regarding event. Patient stated, its pretty good, I'm ok in response to current mood.   Interventions: Cognitive Behavioral Therapy. Clinician conducted session via caregility video from clinician's home office. Patient provided verbal consent to proceed with telehealth session and is aware of limitations of telephone or video visits. Patient participated in session from patient's home. Reviewed events since last session and assessed for changes. Clarified patient's statement at the beginning of today's session. Discussed patient's medical concerns and difficulty falling asleep. Explored triggers for decreased sleep. Discussed relaxation strategies patient utilizes to promote sleep. Provided psycho education related to grounding exercises. Reviewed patient's thought record and explored/identified barriers to completing thought record. Discussed upcoming event and patient's decision to refrain from attending event due to length of event. Explored patient's thoughts/feelings in response to decision to refrain from attending event. Clinician requested for homework patient complete thought record.   Collaboration of Care: Other not required at this time    Diagnosis:  Obsessive-compulsive disorder Trichotillomania   Plan: Patient is to use  CBT and coping skills to help manage decrease in symptoms associated with their diagnosis.   Frequency: bi-weekly  Modality: individual    Long-term goal:   Maintain decrease in obsessive thoughts and compulsive behaviors to 1 to 0 times per week as evidence by decrease in checking sink, mailbox, water heater, AC unit to once per week for at least 3 consecutive months   Target Date: 11/08/24  Progress: progressing    decrease feelings of anger in response to decreased mobility and other medical conditions as evidenced by decrease in feelings of anger and irritability from 3-4 times per month to 1-2 times per month for 3 consecutive months   Target Date: 11/08/24  Progress: progressing    Short-term goal:   identify coping strategies to utilize in response to decreased mobility and other medical conditions   Target Date: 11/08/24  Progress: progressing       Shannon Seats, LCSW

## 2024-04-29 ENCOUNTER — Ambulatory Visit (INDEPENDENT_AMBULATORY_CARE_PROVIDER_SITE_OTHER): Payer: Self-pay | Admitting: Gastroenterology

## 2024-04-29 NOTE — Progress Notes (Signed)
 Hi Tanya ,  Can you please call the patient and tell the patient the lab work is negative for alpha gal celiac panel and stool did not suggest any inflammation.  This is all good news.  I recommend taking Protonix 30 minutes before breakfast  Thanks,  Cornisha Zetino Faizan Nimrit Kehres, MD Gastroenterology and Hepatology  [facility] Gastroenterology ===============  Slightly elevated CRP ( 1.9) can be seen in setting of high BMI  Negative alpha gal Negative celiac panel Fecal calprotectin 21 Ferritin 139

## 2024-04-30 LAB — CELIAC DISEASE PANEL

## 2024-05-19 ENCOUNTER — Ambulatory Visit (INDEPENDENT_AMBULATORY_CARE_PROVIDER_SITE_OTHER): Admitting: Clinical

## 2024-05-19 DIAGNOSIS — F429 Obsessive-compulsive disorder, unspecified: Secondary | ICD-10-CM

## 2024-05-19 DIAGNOSIS — F633 Trichotillomania: Secondary | ICD-10-CM

## 2024-05-19 DIAGNOSIS — F422 Mixed obsessional thoughts and acts: Secondary | ICD-10-CM

## 2024-05-19 NOTE — Progress Notes (Signed)
   [name], LCSW

## 2024-05-19 NOTE — Progress Notes (Signed)
 [facility] Counselor/Therapist Progress Note  Patient ID: Shannon Brooks, MRN: [id number],    Date: 05/19/2024  Time Spent: 9:34am - 10:32am : 58 minutes   Treatment Type: Individual Therapy  Reported Symptoms: negative self talk, fatigue  Mental Status Exam: Appearance:  Neat and Well Groomed     Behavior: Appropriate  Motor: Normal  Speech/Language:  Clear and Coherent and Normal Rate  Affect: Appropriate  Mood: normal  Thought process: normal  Thought content:   WNL  Sensory/Perceptual disturbances:   WNL  Orientation: oriented to person, place, time/date, and situation  Attention: Fair  Concentration: Fair  Memory: WNL  Fund of knowledge:  Good  Insight:   Good  Judgment:  Good  Impulse Control: Good   Risk Assessment: Danger to Self:  No Patient denied current suicidal ideation  Self-injurious Behavior: No Danger to Others: No Patient denied current homicidal ideation Duty to Warn:no Physical Aggression / Violence:No  Access to Firearms a concern: No  Gang Involvement:No   Subjective: Patient stated, its been ok, I actually kept a thought record for three days in response to events since last session. Patient reported patient noted a pattern of repetition on patient's thought record as it relates to patient's thoughts and feelings. Patient stated, a lot of it's guilt. Patient stated, there's guilt, there's resentment, there's a lot of anger towards my own body and stated, this is just eating up my entire life. Patient reported every other month the library hosts a trivia night and patient reported attending as many as we are interested in the topic.  Patient stated, I kind of felt bad because we (patient/friends) were discussing how to handle trivia night. Patient reported patient initiated a conversation with friends to discuss limiting participation in activities on trivia days for patient to be able to attend trivia and patient reported  concern regarding the impact of patient's health on friends.Patient stated, they (friends) were pretty chill with it in response to conversation. Patient stated, so much of my support system is centered around this (my entire medical issues) and stated, it still impacts every single aspect of my life. Patient stated, It goes to a very self-deprecating place. During session, patient acknowledge use of negative self talk. Patient stated, I feel like a burden sometimes and reported patient relies on others for transportation. Patient stated, none in response to evidence to support patient's thought of being a burden and stated, there's evidence that my friends care and support me and reported friends have not vocalized patient is a burden, friend has not ghosted patient, friends were supportive of patient limiting activities. Patient reported feeling tired today. Patient reported patient has discovered a game that patient finds relaxing.  Interventions: Cognitive Behavioral Therapy. Clinician conducted session via caregility video from clinician's home office. Patient provided verbal consent to proceed with telehealth session and is aware of limitations of telephone or video visits. Patient participated in session from patient's home. Reviewed events since last session and assessed for changes. Reviewed patient's thought record entries. Discussed recent conversation with friends and friends' responses. Reviewed challenging negative thoughts. Assisted patient in practicing challenging thought of feeling like a burden and identifying evidence for/against thought. Clinician requested for homework patient continue thought record and practice challenging negative thoughts.    Collaboration of Care: Other not required at this time   Diagnosis:  Obsessive-compulsive disorder Trichotillomania   Plan: Patient is to use CBT and coping skills to help manage decrease in symptoms associated with their  diagnosis.   Frequency: bi-weekly  Modality: individual    Long-term goal:   Maintain decrease in obsessive thoughts and compulsive behaviors to 1 to 0 times per week as evidence by decrease in checking sink, mailbox, water heater, AC unit to once per week for at least 3 consecutive months   Target Date: 11/08/24  Progress: progressing    decrease feelings of anger in response to decreased mobility and other medical conditions as evidenced by decrease in feelings of anger and irritability from 3-4 times per month to 1-2 times per month for 3 consecutive months   Target Date: 11/08/24  Progress: progressing    Short-term goal:   identify coping strategies to utilize in response to decreased mobility and other medical conditions   Target Date: 11/08/24  Progress: progressing       [name], LCSW

## 2024-06-02 ENCOUNTER — Ambulatory Visit: Admitting: Clinical

## 2024-06-02 DIAGNOSIS — H6691 Otitis media, unspecified, right ear: Secondary | ICD-10-CM | POA: Diagnosis not present

## 2024-06-16 ENCOUNTER — Ambulatory Visit (INDEPENDENT_AMBULATORY_CARE_PROVIDER_SITE_OTHER): Admitting: Clinical

## 2024-06-16 DIAGNOSIS — F633 Trichotillomania: Secondary | ICD-10-CM

## 2024-06-16 DIAGNOSIS — F422 Mixed obsessional thoughts and acts: Secondary | ICD-10-CM | POA: Diagnosis not present

## 2024-06-16 NOTE — Progress Notes (Signed)
   [name], LCSW
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
## 2024-07-11 ENCOUNTER — Ambulatory Visit: Admitting: Clinical

## 2024-07-11 DIAGNOSIS — F633 Trichotillomania: Secondary | ICD-10-CM | POA: Diagnosis not present

## 2024-07-11 DIAGNOSIS — F422 Mixed obsessional thoughts and acts: Secondary | ICD-10-CM

## 2024-07-11 NOTE — Progress Notes (Signed)
 "   Behavioral Health Counselor/Therapist Progress Note  Patient ID: Shannon Brooks, MRN: [id number],    Date: 07/11/2024  Time Spent: 9:34am - 10:22am : 48 minutes   Treatment Type: Individual Therapy  Reported Symptoms: fatigue, irritability  Mental Status Exam: Appearance:  Neat and Well Groomed     Behavior: Appropriate  Motor: Normal  Speech/Language:  Clear and Coherent and Normal Rate  Affect: Appropriate  Mood: irritable  Thought process: normal  Thought content:   WNL  Sensory/Perceptual disturbances:   WNL  Orientation: oriented to person, place, time/date, and situation  Attention: Good  Concentration: Good  Memory: WNL  Fund of knowledge:  Good  Insight:   Good  Judgment:  Good  Impulse Control: Good   Risk Assessment: Danger to Self:  No Patient denied current suicidal ideation  Self-injurious Behavior: No Danger to Others: No Patient denied current homicidal ideation Duty to Warn:no Physical Aggression / Violence:No  Access to Firearms a concern: No  Gang Involvement:No   Subjective: Patient stated, its been hectic in response to events since last session and reported due to holiday preparations. Patient reported a family member requested patient make a specific soap two weeks prior to upcoming holiday. Patient stated, I am exhausted, the week I should have had to recover I did not get. Patient reported assisting mother with moving a desk, assisting family members with decorating, accompanying husband to obtain birth certificate, grocery shopping, interactions with family members, making soap, and travel are barriers to physical recovery/rest. Patient stated, all of that takes it out of me. Patient stated, my family understands I'm disabled until they need something. Patient stated, I have been cranky, I'm tired and cranky in response to current mood. Patient stated, a little bit of increase in anxiety in reference to mood and response  to  triggers. Patient stated, sleep, I'm going to make it be feasible Sunday at a bare minimal in reference to self care. Patient reported patient purchased new ear buds for patient's self at the beginning of the month and reported plans to purchase a new kitchen utensil as strategies for self care/kindness to self.   Interventions: Cognitive Behavioral Therapy. Clinician conducted session via caregility video from clinician's home office. Patient provided verbal consent to proceed with telehealth session and is aware of limitations of telephone or video visits. Patient participated in session from patient's home. Reviewed events since last session and assessed for changes. Explored and identified triggers for fatigue and irritability. Discussed patient's feelings regarding upcoming holiday. Explored barriers to physical recovery. Discussed strategies to increase self care/kindness to self.   Collaboration of Care: Other not required at this time   Diagnosis:  Obsessive-compulsive disorder Trichotillomania   Plan: Patient is to use CBT and coping skills to help manage decrease in symptoms associated with their diagnosis.   Frequency: bi-weekly  Modality: individual    Long-term goal:   Maintain decrease in obsessive thoughts and compulsive behaviors to 1 to 0 times per week as evidence by decrease in checking sink, mailbox, water heater, AC unit to once per week for at least 3 consecutive months   Target Date: 11/08/24  Progress: progressing    decrease feelings of anger in response to decreased mobility and other medical conditions as evidenced by decrease in feelings of anger and irritability from 3-4 times per month to 1-2 times per month for 3 consecutive months   Target Date: 11/08/24  Progress: progressing    Short-term goal:   identify coping  strategies to utilize in response to decreased mobility and other medical conditions   Target Date: 11/08/24  Progress: progressing          [name], LCSW    "

## 2024-07-11 NOTE — Progress Notes (Signed)
   [name], LCSW

## 2024-07-27 ENCOUNTER — Other Ambulatory Visit: Payer: Self-pay | Admitting: Obstetrics & Gynecology

## 2024-08-04 ENCOUNTER — Ambulatory Visit: Admitting: Clinical

## 2024-08-04 DIAGNOSIS — F633 Trichotillomania: Secondary | ICD-10-CM

## 2024-08-04 DIAGNOSIS — F422 Mixed obsessional thoughts and acts: Secondary | ICD-10-CM

## 2024-08-04 NOTE — Progress Notes (Signed)
 "  [facility] Counselor/Therapist Progress Note  Patient ID: Shannon Brooks, MRN: [id number],    Date: 08/04/2024  Time Spent: 10:31am - 11:25am : 54 minutes   Treatment Type: Individual Therapy  Reported Symptoms: fatigue  Mental Status Exam: Appearance:  Neat     Behavior: Appropriate  Motor: Normal  Speech/Language:  Clear and Coherent and Normal Rate  Affect: Appropriate  Mood: normal  Thought process: normal  Thought content:   WNL  Sensory/Perceptual disturbances:   WNL  Orientation: oriented to person, place, time/date, and situation  Attention: Fair, easily distracted  Concentration: Fair  Memory: WNL  Fund of knowledge:  Good  Insight:   Good  Judgment:  Good  Impulse Control: Good   Risk Assessment: Danger to Self:  No Patient denied current suicidal ideation  Self-injurious Behavior: No Danger to Others: No Patient denied current homicidal ideation Duty to Warn:no Physical Aggression / Violence:No  Access to Firearms a concern: No  Gang Involvement:No   Subjective: Patient stated, I have been recovering from the holidays in response to events since last session. Patient stated, pretty good in response to outcome of holiday events.  Patient reported patient is recovering from neck pain and swelling in hands due to holiday activities.  Patient stated, I got past Christmas and I collapsed. Patient reported one of patient's days during the holidays started at 8 am and ended at midnight.  Patient stated, I remember bits of the last couple of weeks, I have done a lot of sleeping in reference to impact of holiday activities on patient's physical health. Patient reported patient has started participating in a gaming activity that patient enjoys and allows patient an opportunity to play the game or facilitate the game. Patient stated, the owner is very cognitive of being health conscious in reference to new gaming opportunity. Patient stated, I do  like the energy of this group. Patient stated, I'm tired but I'm fine.   Interventions: Cognitive Behavioral Therapy. Clinician conducted session via caregility video from clinician's home office. Patient provided verbal consent to proceed with telehealth session and is aware of limitations of telephone or video visits. Patient participated in session from patient's home. Reviewed events since last session and assessed for changes. Discussed holiday activities, patient's response to holiday activities, and the impact on patient's physical health. Reviewed positive activities patient has participated in and opportunities to maintain patient's boundaries as it relates to new positive activity and patient's physical health.    Collaboration of Care: Other not required at this time   Diagnosis:  Obsessive-compulsive disorder Trichotillomania   Plan: Patient is to use CBT and coping skills to help manage decrease in symptoms associated with their diagnosis.   Frequency: bi-weekly  Modality: individual    Long-term goal:   Maintain decrease in obsessive thoughts and compulsive behaviors to 1 to 0 times per week as evidence by decrease in checking sink, mailbox, water heater, AC unit to once per week for at least 3 consecutive months   Target Date: 11/08/24  Progress: progressing    decrease feelings of anger in response to decreased mobility and other medical conditions as evidenced by decrease in feelings of anger and irritability from 3-4 times per month to 1-2 times per month for 3 consecutive months   Target Date: 11/08/24  Progress: progressing    Short-term goal:   identify coping strategies to utilize in response to decreased mobility and other medical conditions   Target Date: 11/08/24  Progress: progressing  [name], LCSW    "

## 2024-08-04 NOTE — Progress Notes (Signed)
   [name], LCSW

## 2024-08-25 ENCOUNTER — Ambulatory Visit (INDEPENDENT_AMBULATORY_CARE_PROVIDER_SITE_OTHER): Admitting: Gastroenterology

## 2024-08-28 ENCOUNTER — Encounter (INDEPENDENT_AMBULATORY_CARE_PROVIDER_SITE_OTHER): Payer: Self-pay | Admitting: Gastroenterology

## 2024-08-28 ENCOUNTER — Ambulatory Visit (INDEPENDENT_AMBULATORY_CARE_PROVIDER_SITE_OTHER): Admitting: Gastroenterology

## 2024-08-28 ENCOUNTER — Telehealth (INDEPENDENT_AMBULATORY_CARE_PROVIDER_SITE_OTHER): Payer: Self-pay

## 2024-08-28 VITALS — BP 122/89 | HR 73 | Temp 98.0°F | Ht 65.0 in | Wt 315.0 lb

## 2024-08-28 DIAGNOSIS — R110 Nausea: Secondary | ICD-10-CM | POA: Diagnosis not present

## 2024-08-28 DIAGNOSIS — Z791 Long term (current) use of non-steroidal anti-inflammatories (NSAID): Secondary | ICD-10-CM | POA: Diagnosis not present

## 2024-08-28 DIAGNOSIS — R6881 Early satiety: Secondary | ICD-10-CM | POA: Insufficient documentation

## 2024-08-28 NOTE — Patient Instructions (Signed)
-  continue protonix  40mg  daily -can continue ginger as needed for nausea -try to avoid NSAIDs as much as possible -schedule EGD   Follow up 3 months

## 2024-08-28 NOTE — Telephone Encounter (Signed)
 Spoke with patient in the office, scheduled EGD for 09/18/2024 at 9:15am. Instructions given to patient.

## 2024-08-28 NOTE — Telephone Encounter (Signed)
 Medicare A and B does not require PA

## 2024-08-28 NOTE — Progress Notes (Signed)
 "  Referring Provider: Joeann Browning, FNP Primary Care Physician:  Joeann Browning, FNP Primary GI Physician: Dr. Cinderella   Chief Complaint  Patient presents with   Follow-up    Patient here today for a follow up on her nausea and epigastric pain.patient says she is still having these issues, and does not think Protonix  has helped,but she has decreased her need for Tums.    HPI:   Shannon Brooks is a 39 y.o. female with past medical history of class III obesity, chronic pain, migraines, OCD   Patient presenting today for:  Follow up of nausea   Last seen by Dr. Cinderella in October, at that time taking ginger for nausea. Denied abdominal pain, ibuprofen on regular basis.   Recommendations:  Start protonix  40mg  daily Avoid NSAIDs  IBS panel EGD if PPI does not resolve nausea  Slightly elevated CRP ( 1.9) can be seen in setting of high BMI  Negative alpha gal Negative celiac panel Fecal calprotectin 21 Ferritin 139  Present:  She started on protonix  40mg  daily, does not feel much improvement in nausea from PPI but nausea has not improved. Still eating a lot of ginger. 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 No rectal bleeding or melena.  She states she has missed some doses and did not notice any difference. Still has waves of nausea. Denies any epigastric or stomach pain. Denies any changes in appetite. No vomiting.  She notes sometime having to stop eating early but she does try to follow a regimented diet and sees a dietician. 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 No new meds prior to nausea beginning. She queries if nausea is related to her topamax .    Last Colonoscopy:never  Last Endoscopy: never  [facility] Weights   08/28/24 1510  Weight: (!) 315 lb (142.9 kg)     Past Medical History:  Diagnosis Date   Asthma    Broken toe    Carpal tunnel syndrome    Iron deficiency anemia    Migraine     Near syncope Episodic near-syncope   Obesity, unspecified    Obesity   OCD (obsessive compulsive disorder)    Vertigo    Vitamin D  deficiency     Past Surgical History:  Procedure Laterality Date   LAPAROSCOPIC BILATERAL SALPINGECTOMY Bilateral 04/27/2021   Procedure: LAPAROSCOPIC BILATERAL SALPINGECTOMY;  Surgeon: Jayne Vonn DEL, MD;  Location: [facility];  Service: Gynecology;  Laterality: Bilateral;   TONSILLECTOMY     WISDOM TOOTH EXTRACTION Bilateral     Current Outpatient Medications  Medication Sig Dispense Refill   acetaminophen  (TYLENOL ) 325 MG tablet Take 650 mg by mouth every 6 (six) hours as needed for moderate pain.     albuterol  (PROVENTIL  HFA;VENTOLIN  HFA) 108 (90 BASE) MCG/ACT inhaler Inhale 2 puffs into the lungs every 6 (six) hours as needed for wheezing or shortness of breath.      atorvastatin (LIPITOR) 20 MG tablet Take 20 mg by mouth daily.     cetirizine (ZYRTEC) 10 MG tablet Chew 10 mg by mouth daily.     Cholecalciferol (VITAMIN D ) 50 MCG (2000 UT) tablet Take 2,000 Units by mouth daily.     ergocalciferol  (VITAMIN D2) 1.25 MG (50000 UT) capsule Take 50,000 Units by mouth once a week.     ferrous sulfate 325 (65 FE) MG tablet Take 325 mg by mouth daily with breakfast.  guaifenesin (HUMIBID E) 400 MG TABS tablet Take 400 mg by mouth in the morning and at bedtime.     hyoscyamine (LEVSIN) 0.125 MG tablet Take 0.125 mg by mouth every 6 (six) hours as needed (diarrhea).     ibuprofen (ADVIL,MOTRIN) 800 MG tablet Take 800 mg by mouth at bedtime. (Patient taking differently: Take 800 mg by mouth as needed.)     magnesium oxide (MAG-OX) 400 MG tablet Take 400 mg by mouth 2 (two) times daily.     Menthol, Topical Analgesic, (BIOFREEZE EX) Apply 1 application topically daily as needed (pain).     norethindrone  (MICRONOR ) 0.35 MG tablet Take 1 tablet by mouth once daily 28 tablet 11   pantoprazole  (PROTONIX ) 40 MG tablet Take 1 tablet (40 mg total) by mouth daily. 60  tablet 3   Phenylephrine HCl 5 MG TABS Take 5 mg by mouth in the morning and at bedtime.     Potassium 99 MG TABS Take 99 mg by mouth daily.     Riboflavin (VITAMIN B2 PO) Take 100 mg by mouth 2 (two) times daily.     SUMAtriptan  (IMITREX ) 50 MG tablet Take 1 tablet (50 mg total) by mouth every 2 (two) hours as needed for migraine. May repeat in 2 hours if headache persists or recurs. 12 tablet 11   topiramate  (TOPAMAX ) 100 MG tablet Take 1 tablet (100 mg total) by mouth 2 (two) times daily. 180 tablet 3   No current facility-administered medications for this visit.    Allergies as of 08/28/2024 - Review Complete 08/28/2024  Allergen Reaction Noted   Penicillins  04/30/2012   Serotonin reuptake inhibitors (ssris)  04/23/2024   Desipramine Rash 06/05/2012    Social History   Socioeconomic History   Marital status: Married    Spouse name: Not on file   Number of children: 0   Years of education: some college   Highest education level: Not on file  Occupational History   Occupation: Unemployed  Tobacco Use   Smoking status: Never   Smokeless tobacco: Never  Vaping Use   Vaping status: Never Used  Substance and Sexual Activity   Alcohol use: No   Drug use: No   Sexual activity: Yes    Birth control/protection: Pill, Surgical    Comment: tubal  Other Topics Concern   Not on file  Social History Narrative   Lives at home with spouse.   Right-handed.    Occasional caffeine use.   Social Drivers of Health   Tobacco Use: Low Risk (08/28/2024)   Patient History    Smoking Tobacco Use: Never    Smokeless Tobacco Use: Never    Passive Exposure: Not on file  Financial Resource Strain: Not on file  Food Insecurity: Not on file  Transportation Needs: Not on file  Physical Activity: Not on file  Stress: Not on file  Social Connections: Not on file  Depression (EYV7-0): Not on file  Alcohol Screen: Not on file  Housing: Not on file  Utilities: Not on file  Health Literacy:  Not on file    Review of systems General: negative for malaise, night sweats, fever, chills, weight loss Neck: Negative for lumps, goiter, pain and significant neck swelling Resp: Negative for cough, wheezing, dyspnea at rest CV: Negative for chest pain, leg swelling, palpitations, orthopnea GI: denies melena, hematochezia, vomiting, diarrhea, constipation, dysphagia, odyonophagia, early satiety or unintentional weight loss. +nausea MSK: Negative for joint pain or swelling, back pain, and muscle pain. Derm:  Negative for itching or rash Psych: Denies depression, anxiety, memory loss, confusion. No homicidal or suicidal ideation.  Heme: Negative for prolonged bleeding, bruising easily, and swollen nodes. Endocrine: Negative for cold or heat intolerance, polyuria, polydipsia and goiter. Neuro: negative for tremor, gait imbalance, syncope and seizures. The remainder of the review of systems is noncontributory.  Physical Exam: BP 122/89 (BP Location: Left Arm, Patient Position: Sitting, Cuff Size: Large)   Pulse 73   Temp 98 F (36.7 C) (Temporal)   Ht 5' 5 (1.651 m)   Wt (!) 315 lb (142.9 kg)   BMI 52.42 kg/m  General:   Alert and oriented. No distress noted. Pleasant and cooperative.  Head:  Normocephalic and atraumatic. Eyes:  Conjuctiva clear without scleral icterus. Mouth:  Oral mucosa pink and moist. Good dentition. No lesions. Heart: Normal rate and rhythm, s1 and s2 heart sounds present.  Lungs: Clear lung sounds in all lobes. Respirations equal and unlabored. Abdomen:  +BS, soft, non-tender and non-distended. No rebound or guarding. No HSM or masses noted. Derm: No palmar erythema or jaundice Msk:  Symmetrical without gross deformities. Normal posture. Extremities:  Without edema. Neurologic:  Alert and  oriented x4 Psych:  Alert and cooperative. Normal mood and affect.  Invalid input(s): 6 MONTHS   ASSESSMENT: Marylin Mullens is a 39 y.o. female presenting today for  follow up of nausea  No improvement in nausea with PPI though GERD has improved. Some early satiety. No vomiting or abdominal pain. Takes frequent high dose ibuprofen. As nausea has persisted, recommending EGD for further evaluation as I cannot rule out PUD, gastritis, H pylori. Indications, risks and benefits of procedure discussed in detail with patient. Patient verbalized understanding and is in agreement to proceed with EGD.   PLAN:  -continue protonix  40mg  daily -can continue ginger PRN for nausea -try to avoid NSAIDs -schedule EGD ASA III   All questions were answered, patient verbalized understanding and is in agreement with plan as outlined above.   Follow Up: 3 months   Renuka Farfan L. Brooklinn Longbottom, MSN, APRN, AGNP-C Adult-Gerontology Nurse Practitioner [facility]  "

## 2024-08-29 ENCOUNTER — Ambulatory Visit: Admitting: Clinical

## 2024-08-29 DIAGNOSIS — F422 Mixed obsessional thoughts and acts: Secondary | ICD-10-CM

## 2024-08-29 DIAGNOSIS — F633 Trichotillomania: Secondary | ICD-10-CM

## 2024-08-29 NOTE — Progress Notes (Signed)
"    [facility] Counselor/Therapist Progress Note  Patient ID: Shannon Brooks, MRN: [id number],    Date: 08/29/2024  Time Spent: 10:33am - 11: 21am : 48 minutes  Treatment Type: Individual Therapy  Reported Symptoms: Patient reported irritability  Mental Status Exam: Appearance:  Neat and Well Groomed     Behavior: Appropriate  Motor: Normal  Speech/Language:  Clear and Coherent and Normal Rate  Affect: Appropriate  Mood: irritable  Thought process: tangential  Thought content:   Tangential  Sensory/Perceptual disturbances:   WNL  Orientation: oriented to person, place, time/date, and situation  Attention: Good  Concentration: Good  Memory: WNL  Fund of knowledge:  Good  Insight:   Good  Judgment:  Good  Impulse Control: Good   Risk Assessment: Danger to Self:  No Patient denied current suicidal ideation  Self-injurious Behavior: No Danger to Others: No Patient denied current homicidal ideation Duty to Warn:no Physical Aggression / Violence:No  Access to Firearms a concern: No  Gang Involvement:No   Subjective: Patient reported patient injured patient's shoulder and experienced a cold during recent winter weather. Patient reported initially patient did not have access to cold medication during winter weather event. Patient stated, I have been cranky in response patient's mood since last session. Patient reported not feeling well, barometric pressure, shoulder injury, washing clothes by hand are triggers for irritability. Patient reported patient has been able to participate in gaming online during winter weather. Patient reported experiencing a seizure during a recent game and reported they were making it comfortable in reference to gaming community's response to patient's symptoms. Patient stated, I was getting ill with my body towards the end in reference to recent gaming experiencing and patient's physical health. Patient stated, this is one of the most  positive public groups I've worked with in reference to the groups response to patient's physical health. Patient stated, Its been community building for me in reference to recent gaming opportunity.   Interventions: Cognitive Behavioral Therapy. Clinician conducted session via caregility video from clinician's home office. Patient provided verbal consent to proceed with telehealth session and is aware of limitations of telephone or video visits. Patient participated in session from patient's home. Reviewed events since last session and assessed for changes. Discussed recent stressors. Explored and identified triggers for irritability. Reviewed positive activities/events since last session. Explored benefits to recent gaming opportunity.    Collaboration of Care: Other not required at this time   Diagnosis:  Obsessive-compulsive disorder Trichotillomania   Plan: Patient is to use CBT and coping skills to help manage decrease in symptoms associated with their diagnosis.   Frequency: bi-weekly  Modality: individual    Long-term goal:   Maintain decrease in obsessive thoughts and compulsive behaviors to 1 to 0 times per week as evidence by decrease in checking sink, mailbox, water heater, AC unit to once per week for at least 3 consecutive months   Target Date: 11/08/24  Progress: progressing    decrease feelings of anger in response to decreased mobility and other medical conditions as evidenced by decrease in feelings of anger and irritability from 3-4 times per month to 1-2 times per month for 3 consecutive months   Target Date: 11/08/24  Progress: progressing    Short-term goal:   identify coping strategies to utilize in response to decreased mobility and other medical conditions   Target Date: 11/08/24  Progress: progressing       [name], LCSW    "

## 2024-08-29 NOTE — Progress Notes (Deleted)
   [name], LCSW

## 2024-08-29 NOTE — Progress Notes (Signed)
   Shannon Seats, LCSW

## 2024-09-08 ENCOUNTER — Ambulatory Visit: Admitting: Clinical

## 2024-09-15 ENCOUNTER — Other Ambulatory Visit (HOSPITAL_COMMUNITY)

## 2024-09-18 ENCOUNTER — Ambulatory Visit (HOSPITAL_COMMUNITY): Admit: 2024-09-18 | Admitting: Gastroenterology

## 2024-09-18 ENCOUNTER — Encounter (HOSPITAL_COMMUNITY): Payer: Self-pay

## 2024-10-08 ENCOUNTER — Ambulatory Visit: Admitting: Neurology

## 2024-11-27 ENCOUNTER — Ambulatory Visit (INDEPENDENT_AMBULATORY_CARE_PROVIDER_SITE_OTHER): Admitting: Gastroenterology
